# Patient Record
Sex: Male | Born: 1945 | Race: White | Hispanic: No | Marital: Married | Smoking: Never smoker
Health system: Southern US, Community
[De-identification: ages and names within clinical notes are randomized; demographics above are authoritative.]

## PROBLEM LIST (undated history)

## (undated) DIAGNOSIS — R112 Nausea with vomiting, unspecified: Secondary | ICD-10-CM

## (undated) DIAGNOSIS — IMO0001 Reserved for inherently not codable concepts without codable children: Secondary | ICD-10-CM

## (undated) DIAGNOSIS — G629 Polyneuropathy, unspecified: Secondary | ICD-10-CM

## (undated) DIAGNOSIS — F419 Anxiety disorder, unspecified: Secondary | ICD-10-CM

## (undated) DIAGNOSIS — M545 Low back pain, unspecified: Secondary | ICD-10-CM

## (undated) DIAGNOSIS — L409 Psoriasis, unspecified: Secondary | ICD-10-CM

## (undated) DIAGNOSIS — K219 Gastro-esophageal reflux disease without esophagitis: Secondary | ICD-10-CM

## (undated) DIAGNOSIS — Z87442 Personal history of urinary calculi: Secondary | ICD-10-CM

## (undated) DIAGNOSIS — Z9889 Other specified postprocedural states: Secondary | ICD-10-CM

## (undated) DIAGNOSIS — Z860100 Personal history of colon polyps, unspecified: Secondary | ICD-10-CM

## (undated) DIAGNOSIS — G4730 Sleep apnea, unspecified: Secondary | ICD-10-CM

## (undated) DIAGNOSIS — I10 Essential (primary) hypertension: Secondary | ICD-10-CM

## (undated) DIAGNOSIS — G4700 Insomnia, unspecified: Secondary | ICD-10-CM

## (undated) DIAGNOSIS — G2581 Restless legs syndrome: Secondary | ICD-10-CM

## (undated) DIAGNOSIS — F329 Major depressive disorder, single episode, unspecified: Secondary | ICD-10-CM

## (undated) DIAGNOSIS — F32A Depression, unspecified: Secondary | ICD-10-CM

## (undated) HISTORY — DX: Depression, unspecified: F32.A

## (undated) HISTORY — PX: RETINAL LASER PROCEDURE: SHX2339

## (undated) HISTORY — DX: Restless legs syndrome: G25.81

## (undated) HISTORY — PX: CYSTECTOMY: SUR359

## (undated) HISTORY — PX: LUMBAR EPIDURAL INJECTION: SHX1980

## (undated) HISTORY — DX: Low back pain: M54.5

## (undated) HISTORY — DX: Insomnia, unspecified: G47.00

## (undated) HISTORY — DX: Psoriasis, unspecified: L40.9

## (undated) HISTORY — PX: UVULOPALATOPHARYNGOPLASTY: SHX827

## (undated) HISTORY — DX: Personal history of colon polyps, unspecified: Z86.0100

## (undated) HISTORY — PX: SINUS SURGERY WITH INSTATRAK: SHX5215

## (undated) HISTORY — DX: Gastro-esophageal reflux disease without esophagitis: K21.9

## (undated) HISTORY — PX: OTHER SURGICAL HISTORY: SHX169

## (undated) HISTORY — DX: Essential (primary) hypertension: I10

## (undated) HISTORY — PX: TONSILLECTOMY: SUR1361

## (undated) HISTORY — PX: CATARACT EXTRACTION: SUR2

## (undated) HISTORY — DX: Personal history of colonic polyps: Z86.010

## (undated) HISTORY — DX: Sleep apnea, unspecified: G47.30

## (undated) HISTORY — DX: Low back pain, unspecified: M54.50

## (undated) HISTORY — DX: Polyneuropathy, unspecified: G62.9

## (undated) HISTORY — DX: Major depressive disorder, single episode, unspecified: F32.9

---

## 2002-02-16 HISTORY — PX: OTHER SURGICAL HISTORY: SHX169

## 2006-02-16 HISTORY — PX: LITHOTRIPSY: SUR834

## 2006-08-10 ENCOUNTER — Ambulatory Visit: Payer: Self-pay | Admitting: Family Medicine

## 2006-08-10 LAB — CONVERTED CEMR LAB
Albumin: 3.9 g/dL (ref 3.5–5.2)
Basophils Absolute: 0 10*3/uL (ref 0.0–0.1)
Bilirubin, Direct: 0.1 mg/dL (ref 0.0–0.3)
Cholesterol: 142 mg/dL (ref 0–200)
Creatinine, Ser: 1 mg/dL (ref 0.4–1.5)
Eosinophils Absolute: 0.1 10*3/uL (ref 0.0–0.6)
HCT: 45.1 % (ref 39.0–52.0)
Hemoglobin: 15.2 g/dL (ref 13.0–17.0)
MCHC: 33.7 g/dL (ref 30.0–36.0)
MCV: 95.6 fL (ref 78.0–100.0)
Monocytes Absolute: 0.4 10*3/uL (ref 0.2–0.7)
Neutrophils Relative %: 63.7 % (ref 43.0–77.0)
PSA: 2.37 ng/mL (ref 0.10–4.00)
Potassium: 3.9 meq/L (ref 3.5–5.1)
RDW: 12.1 % (ref 11.5–14.6)
Sodium: 143 meq/L (ref 135–145)
TSH: 0.81 microintl units/mL (ref 0.35–5.50)
Total Bilirubin: 0.8 mg/dL (ref 0.3–1.2)

## 2006-08-11 ENCOUNTER — Ambulatory Visit: Payer: Self-pay | Admitting: Family Medicine

## 2006-08-23 ENCOUNTER — Ambulatory Visit: Payer: Self-pay | Admitting: Family Medicine

## 2006-08-23 DIAGNOSIS — J309 Allergic rhinitis, unspecified: Secondary | ICD-10-CM | POA: Insufficient documentation

## 2006-09-21 ENCOUNTER — Ambulatory Visit (HOSPITAL_COMMUNITY): Admission: RE | Admit: 2006-09-21 | Discharge: 2006-09-21 | Payer: Self-pay | Admitting: Urology

## 2006-09-23 ENCOUNTER — Ambulatory Visit (HOSPITAL_COMMUNITY): Admission: RE | Admit: 2006-09-23 | Discharge: 2006-09-23 | Payer: Self-pay | Admitting: Urology

## 2006-09-28 ENCOUNTER — Encounter: Payer: Self-pay | Admitting: Family Medicine

## 2006-10-11 ENCOUNTER — Encounter: Payer: Self-pay | Admitting: Family Medicine

## 2006-10-14 ENCOUNTER — Telehealth: Payer: Self-pay | Admitting: Family Medicine

## 2006-11-17 ENCOUNTER — Encounter: Payer: Self-pay | Admitting: Family Medicine

## 2006-12-20 DIAGNOSIS — I10 Essential (primary) hypertension: Secondary | ICD-10-CM

## 2007-01-11 ENCOUNTER — Ambulatory Visit: Payer: Self-pay | Admitting: Family Medicine

## 2007-01-11 DIAGNOSIS — J939 Pneumothorax, unspecified: Secondary | ICD-10-CM | POA: Insufficient documentation

## 2007-01-11 DIAGNOSIS — Z87442 Personal history of urinary calculi: Secondary | ICD-10-CM

## 2007-01-11 DIAGNOSIS — F329 Major depressive disorder, single episode, unspecified: Secondary | ICD-10-CM

## 2007-01-11 DIAGNOSIS — Z86010 Personal history of colonic polyps: Secondary | ICD-10-CM

## 2007-01-11 DIAGNOSIS — S2239XA Fracture of one rib, unspecified side, initial encounter for closed fracture: Secondary | ICD-10-CM | POA: Insufficient documentation

## 2007-01-11 DIAGNOSIS — S42009A Fracture of unspecified part of unspecified clavicle, initial encounter for closed fracture: Secondary | ICD-10-CM | POA: Insufficient documentation

## 2007-01-11 DIAGNOSIS — G2581 Restless legs syndrome: Secondary | ICD-10-CM

## 2007-01-11 DIAGNOSIS — J018 Other acute sinusitis: Secondary | ICD-10-CM

## 2007-02-08 ENCOUNTER — Ambulatory Visit: Payer: Self-pay | Admitting: Family Medicine

## 2007-02-08 DIAGNOSIS — R079 Chest pain, unspecified: Secondary | ICD-10-CM

## 2007-02-08 DIAGNOSIS — G4700 Insomnia, unspecified: Secondary | ICD-10-CM

## 2007-03-01 ENCOUNTER — Encounter: Payer: Self-pay | Admitting: Family Medicine

## 2007-03-15 ENCOUNTER — Ambulatory Visit: Payer: Self-pay | Admitting: Family Medicine

## 2007-03-15 DIAGNOSIS — K219 Gastro-esophageal reflux disease without esophagitis: Secondary | ICD-10-CM

## 2007-03-15 DIAGNOSIS — R5383 Other fatigue: Secondary | ICD-10-CM

## 2007-03-15 DIAGNOSIS — R5381 Other malaise: Secondary | ICD-10-CM

## 2007-03-29 ENCOUNTER — Telehealth: Payer: Self-pay | Admitting: Family Medicine

## 2007-05-11 ENCOUNTER — Telehealth: Payer: Self-pay | Admitting: Family Medicine

## 2007-05-13 ENCOUNTER — Ambulatory Visit: Payer: Self-pay | Admitting: Family Medicine

## 2007-05-13 DIAGNOSIS — G589 Mononeuropathy, unspecified: Secondary | ICD-10-CM | POA: Insufficient documentation

## 2007-05-16 ENCOUNTER — Telehealth: Payer: Self-pay | Admitting: Family Medicine

## 2007-05-16 LAB — CONVERTED CEMR LAB
ALT: 21 units/L (ref 0–53)
AST: 20 units/L (ref 0–37)
Alkaline Phosphatase: 67 units/L (ref 39–117)
Basophils Absolute: 0 10*3/uL (ref 0.0–0.1)
Basophils Relative: 0.5 % (ref 0.0–1.0)
Bilirubin, Direct: 0.2 mg/dL (ref 0.0–0.3)
CO2: 32 meq/L (ref 19–32)
Chloride: 104 meq/L (ref 96–112)
GFR calc non Af Amer: 73 mL/min
Lymphocytes Relative: 30 % (ref 12.0–46.0)
MCHC: 33.7 g/dL (ref 30.0–36.0)
Neutrophils Relative %: 58.2 % (ref 43.0–77.0)
Platelets: 122 10*3/uL — ABNORMAL LOW (ref 150–400)
Potassium: 4.1 meq/L (ref 3.5–5.1)
RBC: 4.46 M/uL (ref 4.22–5.81)
RDW: 12.7 % (ref 11.5–14.6)
Sodium: 141 meq/L (ref 135–145)
Total Bilirubin: 0.9 mg/dL (ref 0.3–1.2)

## 2007-05-20 ENCOUNTER — Encounter: Payer: Self-pay | Admitting: Family Medicine

## 2007-06-15 ENCOUNTER — Telehealth: Payer: Self-pay | Admitting: Family Medicine

## 2007-06-16 ENCOUNTER — Ambulatory Visit: Payer: Self-pay | Admitting: Family Medicine

## 2007-09-16 ENCOUNTER — Ambulatory Visit: Payer: Self-pay | Admitting: Internal Medicine

## 2007-10-31 ENCOUNTER — Telehealth: Payer: Self-pay | Admitting: Family Medicine

## 2007-11-11 ENCOUNTER — Ambulatory Visit: Payer: Self-pay | Admitting: Family Medicine

## 2007-11-11 DIAGNOSIS — K589 Irritable bowel syndrome without diarrhea: Secondary | ICD-10-CM

## 2007-11-11 DIAGNOSIS — Z87898 Personal history of other specified conditions: Secondary | ICD-10-CM

## 2007-11-15 LAB — CONVERTED CEMR LAB
Alkaline Phosphatase: 65 units/L (ref 39–117)
Basophils Absolute: 0 10*3/uL (ref 0.0–0.1)
Bilirubin, Direct: 0.2 mg/dL (ref 0.0–0.3)
Calcium: 9.2 mg/dL (ref 8.4–10.5)
Cholesterol: 168 mg/dL (ref 0–200)
GFR calc Af Amer: 72 mL/min
Glucose, Bld: 94 mg/dL (ref 70–99)
HCT: 43.7 % (ref 39.0–52.0)
HDL: 49.2 mg/dL (ref >39.0)
LDL Cholesterol: 109 mg/dL — ABNORMAL HIGH (ref 0–99)
Lymphocytes Relative: 27.2 % (ref 12.0–46.0)
MCHC: 34.6 g/dL (ref 30.0–36.0)
Monocytes Absolute: 0.4 10*3/uL (ref 0.1–1.0)
Monocytes Relative: 8.6 % (ref 3.0–12.0)
Neutro Abs: 2.8 10*3/uL (ref 1.4–7.7)
Platelets: 109 10*3/uL — ABNORMAL LOW (ref 150–400)
Potassium: 3.9 meq/L (ref 3.5–5.1)
RDW: 12.7 % (ref 11.5–14.6)
Sodium: 146 meq/L — ABNORMAL HIGH (ref 135–145)
TSH: 0.4 microintl units/mL (ref 0.35–5.50)
Total Bilirubin: 1.1 mg/dL (ref 0.3–1.2)
Total CHOL/HDL Ratio: 3.4
Total Protein: 6.6 g/dL (ref 6.0–8.3)
Triglycerides: 48 mg/dL (ref 0–149)

## 2007-11-25 ENCOUNTER — Encounter: Payer: Self-pay | Admitting: Family Medicine

## 2008-01-16 ENCOUNTER — Ambulatory Visit: Payer: Self-pay | Admitting: Family Medicine

## 2008-01-25 ENCOUNTER — Ambulatory Visit: Payer: Self-pay | Admitting: Gastroenterology

## 2008-03-02 ENCOUNTER — Ambulatory Visit: Payer: Self-pay | Admitting: Family Medicine

## 2008-03-02 DIAGNOSIS — M79609 Pain in unspecified limb: Secondary | ICD-10-CM

## 2008-04-13 ENCOUNTER — Ambulatory Visit: Payer: Self-pay

## 2008-04-13 ENCOUNTER — Encounter: Payer: Self-pay | Admitting: Family Medicine

## 2008-05-10 ENCOUNTER — Telehealth: Payer: Self-pay | Admitting: Family Medicine

## 2008-05-11 ENCOUNTER — Telehealth: Payer: Self-pay | Admitting: Family Medicine

## 2008-06-13 ENCOUNTER — Ambulatory Visit: Payer: Self-pay | Admitting: Family Medicine

## 2008-06-13 DIAGNOSIS — N390 Urinary tract infection, site not specified: Secondary | ICD-10-CM

## 2008-06-14 ENCOUNTER — Encounter: Payer: Self-pay | Admitting: Family Medicine

## 2008-06-17 ENCOUNTER — Telehealth: Payer: Self-pay | Admitting: Family Medicine

## 2008-06-18 ENCOUNTER — Telehealth: Payer: Self-pay | Admitting: Family Medicine

## 2008-07-02 ENCOUNTER — Telehealth: Payer: Self-pay | Admitting: Family Medicine

## 2008-07-10 ENCOUNTER — Encounter: Payer: Self-pay | Admitting: Family Medicine

## 2008-11-13 ENCOUNTER — Telehealth: Payer: Self-pay | Admitting: Family Medicine

## 2008-11-15 ENCOUNTER — Telehealth: Payer: Self-pay | Admitting: Family Medicine

## 2008-12-14 ENCOUNTER — Ambulatory Visit: Payer: Self-pay | Admitting: Family Medicine

## 2008-12-14 LAB — CONVERTED CEMR LAB
AST: 19 units/L (ref 0–37)
BUN: 17 mg/dL (ref 6–23)
Basophils Absolute: 0 10*3/uL (ref 0.0–0.1)
Bilirubin, Direct: 0.1 mg/dL (ref 0.0–0.3)
Calcium: 8.9 mg/dL (ref 8.4–10.5)
Cholesterol: 147 mg/dL (ref 0–200)
Creatinine, Ser: 1.1 mg/dL (ref 0.4–1.5)
Eosinophils Relative: 4.1 % (ref 0.0–5.0)
GFR calc non Af Amer: 72.08 mL/min (ref >60)
Glucose, Bld: 108 mg/dL — ABNORMAL HIGH (ref 70–99)
HCT: 43 % (ref 39.0–52.0)
HDL: 51.2 mg/dL (ref >39.00)
LDL Cholesterol: 85 mg/dL (ref 0–99)
Lymphocytes Relative: 31.9 % (ref 12.0–46.0)
Lymphs Abs: 1.2 10*3/uL (ref 0.7–4.0)
Monocytes Relative: 9.5 % (ref 3.0–12.0)
Neutrophils Relative %: 54.1 % (ref 43.0–77.0)
PSA: 2.86 ng/mL (ref 0.10–4.00)
Platelets: 113 10*3/uL — ABNORMAL LOW (ref 150.0–400.0)
RDW: 12.8 % (ref 11.5–14.6)
TSH: 1.24 microintl units/mL (ref 0.35–5.50)
Total Bilirubin: 0.7 mg/dL (ref 0.3–1.2)
Triglycerides: 53 mg/dL (ref 0.0–149.0)
VLDL: 10.6 mg/dL (ref 0.0–40.0)
WBC: 3.8 10*3/uL — ABNORMAL LOW (ref 4.5–10.5)

## 2008-12-17 ENCOUNTER — Telehealth (INDEPENDENT_AMBULATORY_CARE_PROVIDER_SITE_OTHER): Payer: Self-pay | Admitting: *Deleted

## 2009-01-02 ENCOUNTER — Ambulatory Visit: Payer: Self-pay | Admitting: Family Medicine

## 2009-01-02 DIAGNOSIS — J209 Acute bronchitis, unspecified: Secondary | ICD-10-CM

## 2009-02-18 ENCOUNTER — Telehealth: Payer: Self-pay | Admitting: Family Medicine

## 2009-04-12 ENCOUNTER — Telehealth: Payer: Self-pay | Admitting: Family Medicine

## 2009-04-30 ENCOUNTER — Telehealth: Payer: Self-pay | Admitting: Family Medicine

## 2010-01-31 ENCOUNTER — Ambulatory Visit: Payer: Self-pay | Admitting: Family Medicine

## 2010-01-31 DIAGNOSIS — M545 Low back pain: Secondary | ICD-10-CM

## 2010-01-31 DIAGNOSIS — F418 Other specified anxiety disorders: Secondary | ICD-10-CM | POA: Insufficient documentation

## 2010-01-31 DIAGNOSIS — M1990 Unspecified osteoarthritis, unspecified site: Secondary | ICD-10-CM | POA: Insufficient documentation

## 2010-02-03 LAB — CONVERTED CEMR LAB
BUN: 18 mg/dL (ref 6–23)
Basophils Absolute: 0 10*3/uL (ref 0.0–0.1)
Bilirubin, Direct: 0.2 mg/dL (ref 0.0–0.3)
CO2: 31 meq/L (ref 19–32)
Calcium: 8.9 mg/dL (ref 8.4–10.5)
Chloride: 103 meq/L (ref 96–112)
Cholesterol: 161 mg/dL (ref 0–200)
Creatinine, Ser: 1.2 mg/dL (ref 0.4–1.5)
Eosinophils Absolute: 0.1 10*3/uL (ref 0.0–0.7)
HDL: 47.7 mg/dL (ref >39.00)
LDL Cholesterol: 99 mg/dL (ref 0–99)
Lymphocytes Relative: 31.2 % (ref 12.0–46.0)
MCHC: 34.6 g/dL (ref 30.0–36.0)
MCV: 95.4 fL (ref 78.0–100.0)
Monocytes Absolute: 0.3 10*3/uL (ref 0.1–1.0)
Neutrophils Relative %: 57.3 % (ref 43.0–77.0)
Platelets: 123 10*3/uL — ABNORMAL LOW (ref 150.0–400.0)
Total Bilirubin: 1 mg/dL (ref 0.3–1.2)
Triglycerides: 73 mg/dL (ref 0.0–149.0)
Vitamin B-12: 411 pg/mL (ref 211–911)

## 2010-02-13 ENCOUNTER — Telehealth: Payer: Self-pay | Admitting: Family Medicine

## 2010-02-19 ENCOUNTER — Telehealth: Payer: Self-pay | Admitting: Family Medicine

## 2010-03-19 NOTE — Progress Notes (Signed)
Summary: refill ambien  Phone Note From Pharmacy   Caller: [id number] Call For: Brandon Schroeder  Summary of Call: refill zolpidem 10mg  1 by mouth at bedtime Initial call taken by: Alfred Levins, CMA,  February 18, 2009 2:22 PM  Follow-up for Phone Call        we gave him a 6 month supply on 12-14-08. he still has one rf available Follow-up by: Nelwyn Salisbury MD,  February 19, 2009 8:15 AM  Additional Follow-up for Phone Call Additional follow up Details #1::        Phone call completed, Pharmacist called Additional Follow-up by: Alfred Levins, CMA,  February 19, 2009 9:18 AM
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
## 2010-03-19 NOTE — Progress Notes (Signed)
Summary: Pt req zpak or antibiotic for bacterial inf  Phone Note Call from Patient Call back at [phone] cell   Caller: Patient Summary of Call: Pt called in and is req a zpak or another antibiotic called bacterial inf. Please call in to [facility]  Initial call taken by: Lucy Antigua,  April 12, 2009 1:28 PM  Follow-up for Phone Call        call in a  Zpack Follow-up by: Nelwyn Salisbury MD,  April 12, 2009 3:44 PM  Additional Follow-up for Phone Call Additional follow up Details #1::        Phone Call Completed, Rx Called In Additional Follow-up by: Alfred Levins, CMA,  April 12, 2009 3:51 PM    New/Updated Medications: ZITHROMAX Z-PAK 250 MG  TABS (AZITHROMYCIN) Use as directed Prescriptions: ZITHROMAX Z-PAK 250 MG  TABS (AZITHROMYCIN) Use as directed  #1 x 0   Entered by:   Alfred Levins, CMA   Authorized by:   Nelwyn Salisbury MD   Signed by:   Alfred Levins, CMA on 04/12/2009   Method used:   Electronically to        [id number] (retail)       [address]       [place], [place]  [address]       Ph: [phone] or [phone]       Fax: [phone]   RxID:   [id number]

## 2010-03-20 NOTE — Progress Notes (Signed)
Summary: does not want vicodin   Phone Note Call from Patient Call back at Home Phone [phone]   Caller: Patient Call For: Brandon Salisbury MD Summary of Call: Etodolac is not helping pt's pain and would like to change it. Initial call taken by: [name] CMA AAMA,  February 13, 2010 1:14 PM  Follow-up for Phone Call        call in Vicodin 5/500 q 6 hours as needed pain, #60 with no rf  Follow-up by: Brandon Salisbury MD,  February 14, 2010 4:30 PM  Additional Follow-up for Phone Call Additional follow up Details #1::        pt notifed and said he does not want vicodin  [facility]  etotodalac "irritated his stomach"  Additional Follow-up by: Pura Spice, RN,  February 14, 2010 4:43 PM    Additional Follow-up for Phone Call Additional follow up Details #2::    try Meloxicam 15 mg once daily , call in #30 with 5 rf  Follow-up by: Brandon Salisbury MD,  February 19, 2010 8:41 AM  Additional Follow-up for Phone Call Additional follow up Details #3:: Details for Additional Follow-up Action Taken: done pt aware.   Additional Follow-up by: Pura Spice, RN,  February 19, 2010 8:41 AM  New/Updated Medications: MELOXICAM 15 MG TABS (MELOXICAM) 1 by mouth once daily Prescriptions: MELOXICAM 15 MG TABS (MELOXICAM) 1 by mouth once daily  #30 x 5   Entered by:   Pura Spice, RN   Authorized by:   Brandon Salisbury MD   Signed by:   Pura Spice, RN on 02/19/2010   Method used:   Electronically to        [id number] (retail)       [address]       [place], [place]  [address]       Ph: [phone] or [phone]       Fax: [phone]   RxID:   [id number]

## 2010-03-20 NOTE — Progress Notes (Signed)
Summary: increase zoloft   Phone Note Call from Patient   Caller: Patient Summary of Call: wants to increase his zoloft  [facility] [address]  Initial call taken by: Pura Spice, RN,  February 19, 2010 8:45 AM  Follow-up for Phone Call        increase to 100 mg a day, he may take 2 50 mg tabs a day. Let me know how he is doing in 2 weeks  Follow-up by: Nelwyn Salisbury MD,  February 21, 2010 8:45 AM  Additional Follow-up for Phone Call Additional follow up Details #1::        pt aware.  Additional Follow-up by: Pura Spice, RN,  February 21, 2010 3:30 PM
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
## 2010-05-18 HISTORY — PX: CARPAL TUNNEL RELEASE: SHX101

## 2010-05-30 ENCOUNTER — Telehealth: Payer: Self-pay | Admitting: *Deleted

## 2010-05-30 NOTE — Telephone Encounter (Signed)
Pt states that he had carpal tunnel surgery one week ago, and has been having chills and abdominal cramping since.  His surgeon is aware, but he will call him and let him know that it is still going on, and call us back if needed.

## 2010-06-02 NOTE — Telephone Encounter (Signed)
Notified Dr. Kirtland Bouchard.

## 2010-06-04 ENCOUNTER — Ambulatory Visit (INDEPENDENT_AMBULATORY_CARE_PROVIDER_SITE_OTHER): Payer: BC Managed Care – PPO | Admitting: Family Medicine

## 2010-06-04 ENCOUNTER — Encounter: Payer: Self-pay | Admitting: Family Medicine

## 2010-06-04 VITALS — BP 118/78 | HR 89 | Temp 98.5°F | Wt 223.0 lb

## 2010-06-04 DIAGNOSIS — I10 Essential (primary) hypertension: Secondary | ICD-10-CM

## 2010-06-04 DIAGNOSIS — J329 Chronic sinusitis, unspecified: Secondary | ICD-10-CM

## 2010-06-04 MED ORDER — DOXYCYCLINE HYCLATE 100 MG PO CAPS: 100.0000 mg | ORAL_CAPSULE | Freq: Two times a day (BID) | ORAL | Status: AC

## 2010-06-04 MED ORDER — AMLODIPINE BESY-BENAZEPRIL HCL 5-40 MG PO CAPS: 1.0000 | ORAL_CAPSULE | Freq: Every day | ORAL | Status: DC

## 2010-06-04 MED ORDER — FLUTICASONE PROPIONATE 50 MCG/ACT NA SUSP: 2.0000 | Freq: Every day | NASAL | Status: DC

## 2010-06-04 MED ORDER — ZOLPIDEM TARTRATE 10 MG PO TABS: 10.0000 mg | ORAL_TABLET | Freq: Every evening | ORAL | Status: DC | PRN

## 2010-06-04 NOTE — Progress Notes (Signed)
  Subjective:    Patient ID: Brandon Schroeder, male    DOB: 1945/03/14, 66 y.o.   MRN: [id number]  HPI Here for one week of sinus pressure, PND, ST,and a dry cough. No fever.    Review of Systems  Constitutional: Negative.   HENT: Positive for congestion, postnasal drip and sinus pressure.   Eyes: Negative.   Respiratory: Positive for cough.        Objective:   Physical Exam  Constitutional: He appears well-developed and well-nourished.  HENT:  Head: Normocephalic.  Right Ear: External ear normal.  Left Ear: External ear normal.  Nose: Nose normal.  Mouth/Throat: Oropharynx is clear and moist. No oropharyngeal exudate.  Eyes: Conjunctivae are normal. Pupils are equal, round, and reactive to light.  Cardiovascular: Normal rate, regular rhythm, normal heart sounds and intact distal pulses.   Pulmonary/Chest: Effort normal and breath sounds normal. No respiratory distress. He has no wheezes. He has no rales. He exhibits no tenderness.  Lymphadenopathy:    He has no cervical adenopathy.          Assessment & Plan:  Rest, fluids, Mucinex
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
## 2010-07-01 NOTE — Assessment & Plan Note (Signed)
[facility] OFFICE NOTE   Brandon Schroeder, Brandon Schroeder                          MRN:          [id number]  DATE:08/23/2006                            DOB:          1945-06-12    This is a 65 year old gentleman here for a complete physical  examination. In general he is doing well and has no particular  complaints. I had an introductory visit with him on June 24th. I refer  you to this note concerning details of his past medical history, family  history, social history, habits, etc. One thing we have been adjusting  lately is his blood pressure. We increased his Lotrel a few weeks ago  and it seems to have worked nicely. He is tolerating it well. We also  added aspirin to his regimen. When I saw him on June 24, we started him  on a course of Levaquin for a sinus infection, after 1 pill he began  showing an ALLERGIC REACTION. He took Benadryl and this probably  resolved. We then treated him a 10 day course of Biaxin and this  resolved all of his symptoms. 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 He is due to see Dr. [place] Service to evaluate this on  July 24. He is also due to have Dr. Terri Piedra give him a full skin check on  July 22. One thing we can add to his past medical history, a year ago he  had some vascular health screenings done and on Jul 10, 2005 he had  Doppler done to both carotid arteries. 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   ALLERGIES:  PENICILLIN AND LEVAQUIN.   CURRENT MEDICATIONS:  1. Lotrel 10/20 once a day.  2. Zyrtec 10 mg per day.  3. Clonazepam 0.5 mg at bedtime.  4. Multivitamins daily.  5. Aspirin 81 mg daily.  6. Nasacort nasal sprays daily.  7. Ambien 10 mg 2 tablets at bedtime time as needed for sleep.   OBJECTIVE:  Height 6 feet 2 inches, weight 221, blood pressure 128/82,  pulse 92 and regular.  IN  GENERAL: He appears healthy.  SKIN: Shows some solar damage around the shoulders and arms.  EYES: Clear.  EARS: Clear.  PHARYNX: Clear.  NECK: Supple without lymphadenopathy or masses.  LUNGS: Clear.  CARDIAC: Rate and rhythm regular without gallops, murmurs, or rubs.  Distal pulses are full.   EKG: Within normal limits.   ABDOMEN: Soft, normal bowel sounds, nontender. No masses.  We deferred a genitalia exam because he will be seeing Dr. Wanda Plump in  a few weeks.  We did do a rectal exam today. Prostate is within normal limits. Stool  is Hemoccult negative.  EXTREMITIES: No clubbing, cyanosis, or edema.  NEUROLOGIC EXAM: Grossly intact.   He was here for fasting labs on June 24. These were all basically within  normal limits with the exception of a low HDL at 34 with an adequate LDL  of 92. He also  did show some blood in his urine as usual. PSA was within  normal limits at 2.37.   ASSESSMENT/PLAN:  1. Complete physical exam. I talked about getting more exercise on a      regular basis.  2. Hypertension, stable.  3. Skin changes, he will follow up with dermatology as above.  4. Kidney stone and hematuria, he will follow up with urology as      above.  5. Insomnia, stable.  6. Restless legs, stable.     Tera Mater. Clent Ridges, MD  Electronically Signed    SAF/MedQ  DD: 08/23/2006  DT: 08/23/2006  Job #: [id number]

## 2010-07-01 NOTE — Op Note (Signed)
NAME:  Brandon Schroeder, HEARD NO.:  [id number]   MEDICAL RECORD NO.:  [id number]          PATIENT TYPE:  AMB   LOCATION:  DAY                          FACILITY:  [facility]   PHYSICIAN:  Boston Service, M.D.DATE OF BIRTH:  1945/09/21   DATE OF PROCEDURE:  09/21/2006  DATE OF DISCHARGE:                               OPERATIVE REPORT   PREOPERATIVE DIAGNOSIS:  [age]-year-old male recently relocated  from [place] to [place], previous extracorporeal shock wave  lithotripsy with Dr. Albina Billet in about 2005. The patient relates  that post extracorporeal shock wave lithotripsy, KUBs showed 2 or 3  very small fragments in the lower pole of the right kidney.  Recent  radiographic studies showed what appear to be 15-mm and 7-mm stones  within the right renal pelvis; these apparently match up well with  radiographs done in [place] in about April 2008.  I discussed the  situation with the patient; he seems very familiar with a technique of  lithotripsy.  Mainly due to the size of the stones, I do think he would  benefit from a double-J stent to avoid ureteral obstruction as he passes  the fragments.   POSTOPERATIVE DIAGNOSIS:  [age]-year-old male recently relocated  from [place] to [place], previous extracorporeal shock wave  lithotripsy with Dr. Albina Billet in about 2005. The patient relates  that post extracorporeal shock wave lithotripsy, KUBs showed 2 or 3  very small fragments in the lower pole of the right kidney.  Recent  radiographic studies showed what appear to be 15-mm and 7-mm stones  within the right renal pelvis; these apparently match up well with  radiographs done in [place] in about April 2008.  I discussed the  situation with the patient; he seems very familiar with a technique of  lithotripsy.  Mainly due to the size of the stones, I do think he would  benefit from a double-J stent to avoid ureteral obstruction as he passes  the  fragments.   PROCEDURE:  1. Cystoscopy.  2. Retrograde double-J stent.   SURGEON:  Boston Service, M.D.   ASSISTANTS:  None.   FINDINGS:  Fifteen- and eight-millimeter calculi.   DRAINS:  A 6-French 26-cm double-J stent.   COMPLICATIONS:  None obvious.   DESCRIPTION OF PROCEDURE:  The patient was prepped and draped in the  dorsal lithotomy position after institution of adequate level of general  anesthesia.  A well-lubricated 21-French panendoscope was gently  inserted at the urethral meatus, normal urethra and sphincter,  nonobstructive prostate.  Bladder was inspected with the 12- and 70-  degree lenses and showed no obvious evidence of intravesical pathology.   Ureteral catheter was selected, positioned at the right ureteral  orifice.  With gentle injection of contrast the ureter, pelvis and  calyces were outlined.  There appeared to be mild physiologic narrowing  in the region of the right UPJ with a 15-mm filling defect within the  pelvis and a 7- to 8-mm filling defect in the lower pole calyces.  A  floppy-tip guidewire was then gently inserted, appeared to pass into  dilated  upper pole calyces, ureteral catheter passed easily over the  guidewire.  Ureteral catheter was then withdrawn.  A 6-French 26-cm  double-J stent was then selected with guidewire removal.  There appeared  to be excellent pigtail formation, both in the renal pelvis and in the  bladder.  Ureteral catheter was then positioned at the left ureteral  orifice.  Left ureter showed normal course and caliber.  Again, there  was mild physiologic narrowing at the UPJ, but no evidence of  obstruction, no filling defects, with prompt drainage at 3-6 minutes.  Bladder was then drained.  Cystoscope was removed.  The patient was  given a B&O suppository and returned to [facility] in satisfactory  condition.           ______________________________  Boston Service, M.D.     RH/MEDQ  D:  09/21/2006  T:   09/21/2006  Job:  [id number]   cc:   Jeannett Senior A. Clent Ridges, MD  [address] [place]  [place] [address]   [place], [place] Albina Billet MD

## 2010-08-18 ENCOUNTER — Other Ambulatory Visit: Payer: Self-pay | Admitting: *Deleted

## 2010-08-18 NOTE — Telephone Encounter (Signed)
Refill ambien

## 2010-08-19 ENCOUNTER — Encounter: Payer: Self-pay | Admitting: Family Medicine

## 2010-08-19 ENCOUNTER — Encounter: Payer: Self-pay | Admitting: *Deleted

## 2010-08-19 MED ORDER — AMLODIPINE BESY-BENAZEPRIL HCL 5-40 MG PO CAPS: 1.0000 | ORAL_CAPSULE | Freq: Every day | ORAL | Status: DC

## 2010-08-19 MED ORDER — ZOLPIDEM TARTRATE 10 MG PO TABS: 10.0000 mg | ORAL_TABLET | Freq: Every evening | ORAL | Status: DC | PRN

## 2010-08-19 MED ORDER — FLUTICASONE PROPIONATE 50 MCG/ACT NA SUSP: 2.0000 | Freq: Every day | NASAL | Status: DC

## 2010-08-19 NOTE — Telephone Encounter (Signed)
Refill left on pharmacy voicemail. Notified pt, he states he is now using [facility] mail order service and needs refill sent to them. He also requests 90 day supply on Lotrel and Flonase to [facility]. Refills called to Christine at [facility]. Zolpidem refill cancelled at [facility].

## 2010-08-19 NOTE — Telephone Encounter (Signed)
Call in #[phone] with 5 rf 

## 2010-08-28 HISTORY — PX: EYE SURGERY: SHX253

## 2010-10-09 ENCOUNTER — Telehealth: Payer: Self-pay | Admitting: Family Medicine

## 2010-10-09 NOTE — Telephone Encounter (Signed)
Call in #[phone] with 5 rf 

## 2010-10-09 NOTE — Telephone Encounter (Signed)
Refill request for Zolpidem Tartrate 10 mg take 1 po qhs. Pt last here on 06/04/10 and script last filled on 05/30/10.

## 2010-10-10 NOTE — Telephone Encounter (Signed)
Pt requesting a 90 day supply and Dr. Clent Ridges approved.

## 2010-10-13 MED ORDER — ZOLPIDEM TARTRATE 10 MG PO TABS: 10.0000 mg | ORAL_TABLET | Freq: Every evening | ORAL | Status: DC | PRN

## 2010-10-13 NOTE — Telephone Encounter (Signed)
I called in a 90 day supply to [facility] and left message for pt.

## 2010-10-13 NOTE — Telephone Encounter (Signed)
I left message for pt. I need to know if pt wants this called in to local pharmacy or the mail order?

## 2010-12-01 LAB — HEMOGLOBIN AND HEMATOCRIT, BLOOD
HCT: 40.4
Hemoglobin: 14.3

## 2010-12-01 LAB — BASIC METABOLIC PANEL
Chloride: 107
Creatinine, Ser: 1.01
GFR calc Af Amer: >60
Potassium: 3.7
Sodium: 141

## 2011-02-06 ENCOUNTER — Encounter: Payer: Self-pay | Admitting: Family Medicine

## 2011-02-06 ENCOUNTER — Ambulatory Visit (INDEPENDENT_AMBULATORY_CARE_PROVIDER_SITE_OTHER): Payer: BC Managed Care – PPO | Admitting: Family Medicine

## 2011-02-06 VITALS — BP 114/82 | HR 78 | Temp 98.4°F | Wt 222.0 lb

## 2011-02-06 DIAGNOSIS — G629 Polyneuropathy, unspecified: Secondary | ICD-10-CM

## 2011-02-06 DIAGNOSIS — J40 Bronchitis, not specified as acute or chronic: Secondary | ICD-10-CM

## 2011-02-06 DIAGNOSIS — G4700 Insomnia, unspecified: Secondary | ICD-10-CM

## 2011-02-06 DIAGNOSIS — G589 Mononeuropathy, unspecified: Secondary | ICD-10-CM

## 2011-02-06 DIAGNOSIS — F329 Major depressive disorder, single episode, unspecified: Secondary | ICD-10-CM

## 2011-02-06 MED ORDER — GABAPENTIN 300 MG PO CAPS: 300.0000 mg | ORAL_CAPSULE | Freq: Two times a day (BID) | ORAL | Status: DC

## 2011-02-06 MED ORDER — TEMAZEPAM 30 MG PO CAPS: 30.0000 mg | ORAL_CAPSULE | Freq: Every evening | ORAL | Status: DC | PRN

## 2011-02-06 MED ORDER — SERTRALINE HCL 50 MG PO TABS: 50.0000 mg | ORAL_TABLET | Freq: Every day | ORAL | Status: DC

## 2011-02-06 MED ORDER — AZITHROMYCIN 250 MG PO TABS: ORAL_TABLET | ORAL | Status: AC

## 2011-02-06 NOTE — Progress Notes (Signed)
  Subjective:    Patient ID: Brandon Schroeder, male    DOB: 07-14-1945, 65 y.o.   MRN: [id number]  HPI Here for several issues. First he has had one week of chest congestion and coughing up green sputum. No fever. On Mucinex. Second, his depression is coming back and he wants to get back on Zoloft. Third, he has one year of burning and tingling in the feet consistent with neuropathy. We have evaluated this before and he chose to not treat it, but now he wants to treat this. Fourth he does not like Ambien for sleep because it causes memory problems for him.    Review of Systems  Constitutional: Negative.   HENT: Positive for congestion, postnasal drip and sinus pressure.   Eyes: Negative.   Respiratory: Positive for cough.   Neurological: Negative.   Psychiatric/Behavioral: Positive for sleep disturbance, dysphoric mood and decreased concentration. The patient is nervous/anxious.        Objective:   Physical Exam  Constitutional: He appears well-developed and well-nourished.  HENT:  Right Ear: External ear normal.  Left Ear: External ear normal.  Nose: Nose normal.  Mouth/Throat: Oropharynx is clear and moist. No oropharyngeal exudate.  Eyes: Conjunctivae are normal.  Pulmonary/Chest: Effort normal and breath sounds normal.  Lymphadenopathy:    He has no cervical adenopathy.  Psychiatric: He has a normal mood and affect. His behavior is normal. Thought content normal.          Assessment & Plan:  Use  Zpack for bronchitis. Try Gabapentin for the neuropathy. Switch from Ambien to Temazepam. Get back on Zoloftu

## 2011-03-18 ENCOUNTER — Other Ambulatory Visit (INDEPENDENT_AMBULATORY_CARE_PROVIDER_SITE_OTHER): Payer: BC Managed Care – PPO

## 2011-03-18 DIAGNOSIS — Z0000 Encounter for general adult medical examination without abnormal findings: Secondary | ICD-10-CM

## 2011-03-18 LAB — HEPATIC FUNCTION PANEL
ALT: 17 U/L (ref 0–53)
Alkaline Phosphatase: 79 U/L (ref 39–117)
Bilirubin, Direct: 0.1 mg/dL (ref 0.0–0.3)
Total Protein: 6.2 g/dL (ref 6.0–8.3)

## 2011-03-18 LAB — BASIC METABOLIC PANEL
Chloride: 106 mEq/L (ref 96–112)
Creatinine, Ser: 1.3 mg/dL (ref 0.4–1.5)
Potassium: 4.2 mEq/L (ref 3.5–5.1)
Sodium: 144 mEq/L (ref 135–145)

## 2011-03-18 LAB — POCT URINALYSIS DIPSTICK
Bilirubin, UA: NEGATIVE
Glucose, UA: NEGATIVE
Nitrite, UA: NEGATIVE

## 2011-03-18 LAB — LIPID PANEL: Total CHOL/HDL Ratio: 4

## 2011-03-18 LAB — CBC WITH DIFFERENTIAL/PLATELET
Basophils Relative: 0.4 % (ref 0.0–3.0)
Eosinophils Relative: 1.9 % (ref 0.0–5.0)
MCV: 95.9 fl (ref 78.0–100.0)
Monocytes Absolute: 0.4 10*3/uL (ref 0.1–1.0)
Neutrophils Relative %: 58.1 % (ref 43.0–77.0)
RBC: 4.41 Mil/uL (ref 4.22–5.81)
WBC: 4.7 10*3/uL (ref 4.5–10.5)

## 2011-03-18 LAB — TSH: TSH: 1.51 u[IU]/mL (ref 0.35–5.50)

## 2011-03-20 NOTE — Progress Notes (Signed)
Quick Note:  Left voice message ______ 

## 2011-03-25 ENCOUNTER — Ambulatory Visit (INDEPENDENT_AMBULATORY_CARE_PROVIDER_SITE_OTHER): Payer: BC Managed Care – PPO | Admitting: Family Medicine

## 2011-03-25 ENCOUNTER — Encounter: Payer: Self-pay | Admitting: Family Medicine

## 2011-03-25 VITALS — BP 110/72 | HR 84 | Temp 98.7°F | Ht 74.0 in | Wt 220.0 lb

## 2011-03-25 DIAGNOSIS — R350 Frequency of micturition: Secondary | ICD-10-CM

## 2011-03-25 DIAGNOSIS — IMO0001 Reserved for inherently not codable concepts without codable children: Secondary | ICD-10-CM

## 2011-03-25 DIAGNOSIS — Z0000 Encounter for general adult medical examination without abnormal findings: Secondary | ICD-10-CM

## 2011-03-25 LAB — POCT URINALYSIS DIPSTICK
Bilirubin, UA: NEGATIVE
Glucose, UA: NEGATIVE
Nitrite, UA: NEGATIVE
Urobilinogen, UA: 0.2

## 2011-03-25 MED ORDER — SERTRALINE HCL 100 MG PO TABS: 100.0000 mg | ORAL_TABLET | Freq: Every day | ORAL | Status: DC

## 2011-03-25 MED ORDER — SULFAMETHOXAZOLE-TRIMETHOPRIM 800-160 MG PO TABS: 1.0000 | ORAL_TABLET | Freq: Two times a day (BID) | ORAL | Status: AC

## 2011-03-25 MED ORDER — TEMAZEPAM 30 MG PO CAPS: 30.0000 mg | ORAL_CAPSULE | Freq: Every evening | ORAL | Status: DC | PRN

## 2011-03-25 MED ORDER — AMLODIPINE BESY-BENAZEPRIL HCL 5-40 MG PO CAPS: 1.0000 | ORAL_CAPSULE | Freq: Every day | ORAL | Status: DC

## 2011-03-25 MED ORDER — GABAPENTIN 300 MG PO CAPS: 300.0000 mg | ORAL_CAPSULE | Freq: Three times a day (TID) | ORAL | Status: DC

## 2011-03-25 MED ORDER — MELOXICAM 15 MG PO TABS: 15.0000 mg | ORAL_TABLET | Freq: Every day | ORAL | Status: DC

## 2011-03-25 NOTE — Progress Notes (Signed)
  Subjective:    Patient ID: Brandon Schroeder, male    DOB: Jul 18, 1945, 66 y.o.   MRN: [id number]  HPI 66 yr old male for a cpx. He feels well except for a few issues. When we saw him last December he mentioned some depression. He was started on Zoloft 50 mg a day, and this has helped. He would like to increase the dose a bit. Also he started on Gabapentin for neuropathy, and this has helped a lot. He would like to try more of this.    Review of Systems  Constitutional: Negative.   HENT: Negative.   Eyes: Negative.   Respiratory: Negative.   Cardiovascular: Negative.   Gastrointestinal: Negative.   Genitourinary: Negative.   Musculoskeletal: Negative.   Skin: Negative.   Neurological: Negative.   Hematological: Negative.   Psychiatric/Behavioral: Negative.        Objective:   Physical Exam  Constitutional: He is oriented to person, place, and time. He appears well-developed and well-nourished. No distress.  HENT:  Head: Normocephalic and atraumatic.  Right Ear: External ear normal.  Left Ear: External ear normal.  Nose: Nose normal.  Mouth/Throat: Oropharynx is clear and moist. No oropharyngeal exudate.  Eyes: Conjunctivae and EOM are normal. Pupils are equal, round, and reactive to light. Right eye exhibits no discharge. Left eye exhibits no discharge. No scleral icterus.  Neck: Neck supple. No JVD present. No tracheal deviation present. No thyromegaly present.  Cardiovascular: Normal rate, regular rhythm, normal heart sounds and intact distal pulses.  Exam reveals no gallop and no friction rub.   No murmur heard.      EKG normal   Pulmonary/Chest: Effort normal and breath sounds normal. No respiratory distress. He has no wheezes. He has no rales. He exhibits no tenderness.  Abdominal: Soft. Bowel sounds are normal. He exhibits no distension and no mass. There is no tenderness. There is no rebound and no guarding.  Genitourinary: Rectum normal, prostate normal and penis normal.  Guaiac negative stool. No penile tenderness.  Musculoskeletal: Normal range of motion. He exhibits no edema and no tenderness.  Lymphadenopathy:    He has no cervical adenopathy.  Neurological: He is alert and oriented to person, place, and time. He has normal reflexes. No cranial nerve deficit. He exhibits normal muscle tone. Coordination normal.  Skin: Skin is warm and dry. No rash noted. He is not diaphoretic. No erythema. No pallor.  Psychiatric: He has a normal mood and affect. His behavior is normal. Judgment and thought content normal.          Assessment & Plan:  Increase Zoloft to 100 mg a day. Increase Gabapentin to 300 mg tid. Treat the UTI.

## 2011-03-25 NOTE — Progress Notes (Signed)
Addended by: Aniceto Boss A on: 03/25/2011 02:59 PM   Modules accepted: Orders

## 2011-04-08 ENCOUNTER — Ambulatory Visit (AMBULATORY_SURGERY_CENTER): Payer: BC Managed Care – PPO | Admitting: *Deleted

## 2011-04-08 ENCOUNTER — Encounter: Payer: Self-pay | Admitting: Internal Medicine

## 2011-04-08 VITALS — Ht 74.0 in | Wt 224.1 lb

## 2011-04-08 DIAGNOSIS — Z1211 Encounter for screening for malignant neoplasm of colon: Secondary | ICD-10-CM

## 2011-04-08 MED ORDER — PEG-KCL-NACL-NASULF-NA ASC-C 100 G PO SOLR: ORAL | Status: DC

## 2011-04-10 ENCOUNTER — Telehealth: Payer: Self-pay | Admitting: *Deleted

## 2011-04-10 NOTE — Telephone Encounter (Signed)
PREVISIT DONE BY KATHY SMITH,RN. RELEASE OF INFORMATION FORM FILLED OUT AND SIGNED BY PATIENT AND GIVEN TO STACY HOWALD,CMA. PT SCHEDULED FOR COLONOSCOPY WITH DR.PYRTLE ON 04-16-11. LAST COLONOSCOPY 5 YEARS AGO IN [place]. PT SAYS HE HAD POLYPS BUT DOES NOT KNOW WHAT KIND. PLEASE LET PATIENT KNOW IF FAXED INFORMATION SHOWS HE DOES NOT NEED PROCEDURE PER DR.PYRTLE. THANKS.

## 2011-04-16 ENCOUNTER — Encounter: Payer: Self-pay | Admitting: Internal Medicine

## 2011-04-16 ENCOUNTER — Ambulatory Visit (AMBULATORY_SURGERY_CENTER): Payer: BC Managed Care – PPO | Admitting: Internal Medicine

## 2011-04-16 DIAGNOSIS — K635 Polyp of colon: Secondary | ICD-10-CM

## 2011-04-16 DIAGNOSIS — D126 Benign neoplasm of colon, unspecified: Secondary | ICD-10-CM

## 2011-04-16 DIAGNOSIS — Z1211 Encounter for screening for malignant neoplasm of colon: Secondary | ICD-10-CM

## 2011-04-16 HISTORY — PX: COLONOSCOPY: SHX174

## 2011-04-16 MED ORDER — SODIUM CHLORIDE 0.9 % IV SOLN: 500.0000 mL | INTRAVENOUS | Status: DC

## 2011-04-16 NOTE — Progress Notes (Signed)
Patient did not experience any of the following events: a burn prior to discharge; a fall within the facility; wrong site/side/patient/procedure/implant event; or a hospital transfer or hospital admission upon discharge from the facility. (G8907) Patient did not have preoperative order for IV antibiotic SSI prophylaxis. (G8918)  

## 2011-04-16 NOTE — Op Note (Signed)
[facility] [address] [place], [place]  [address]  COLONOSCOPY PROCEDURE REPORT  PATIENT:  Brandon, Schroeder  MR#:  [id number] BIRTHDATE:  September 14, 1945, 65 yrs. old  GENDER:  male ENDOSCOPIST:  Carie Caddy. Wynonia Medero, MD REF. BY:  Tera Mater. Clent Ridges, M.D. PROCEDURE DATE:  04/16/2011 PROCEDURE:  Colonoscopy with snare polypectomy, Colon with cold biopsy polypectomy ASA CLASS:  Class II INDICATIONS:  surveillance and high-risk screening (history of adenomatous colon polyps) MEDICATIONS:   MAC sedation, administered by CRNA  DESCRIPTION OF PROCEDURE:   After the risks benefits and alternatives of the procedure were thoroughly explained, informed consent was obtained.  Digital rectal exam was performed and revealed no rectal masses.   The LB CF-H180AL [id number] endoscope was introduced through the anus and advanced to the cecum, which was identified by both the appendix and ileocecal valve, without limitations.  The quality of the prep was good, using MoviPrep. The instrument was then slowly withdrawn as the colon was fully examined. <<PROCEDUREIMAGES>>  FINDINGS:  A 7 mm  sessile polyp was found in the ascending colon. Polyp was snared without cautery and removed with cold biopsy polypectomy. Retrieval was successful.  A 3 mm sessile polyp was found in the proximal transverse colon. Polyp was snared without cautery. Retrieval was successful. Moderate diverticulosis was found ascending colon to sigmoid colon.  Internal Hemorrhoids were found.   Retroflexed views in the rectum revealed no other findings other than those already described. The scope was then withdrawn from the cecum and the procedure completed.  COMPLICATIONS:  None ENDOSCOPIC IMPRESSION: 1) Sessile polyp in the ascending colon. Removed and sent to pathology. 2) Sessile polyp in the proximal transverse colon. Removed and sent to pathology. 3) Moderate diverticulosis ascending colon to sigmoid colon 4) Internal  hemorrhoids  RECOMMENDATIONS: 1) Hold aspirin, aspirin products, and anti-inflammatory medication for 1 week. 2) Await pathology results 3) High fiber diet. 4) Given your significant family history of colon cancer, you should have a repeat colonoscopy in 5 years  Carie Caddy. Rhea Belton, MD  CC:  Nelwyn Salisbury, MD The Patient  n. eSIGNED:   Carie Caddy. Uzair Godley at 04/16/2011 03:24 PM  Kallie Locks, [phone]

## 2011-04-16 NOTE — Patient Instructions (Signed)

## 2011-04-17 ENCOUNTER — Telehealth: Payer: Self-pay | Admitting: *Deleted

## 2011-04-17 NOTE — Telephone Encounter (Signed)
Left message on number given yest in admitting. ewm 

## 2011-04-20 ENCOUNTER — Other Ambulatory Visit: Payer: BC Managed Care – PPO | Admitting: Internal Medicine

## 2011-04-22 ENCOUNTER — Ambulatory Visit: Payer: BC Managed Care – PPO | Admitting: Family Medicine

## 2011-04-22 ENCOUNTER — Encounter: Payer: Self-pay | Admitting: Internal Medicine

## 2011-07-15 ENCOUNTER — Telehealth: Payer: Self-pay | Admitting: Family Medicine

## 2011-07-15 NOTE — Telephone Encounter (Signed)
Pt insurance has changed they no longer except 90 day refills and need all scripts sent to a local pharmacy. Pt is not sure what pharmacy he is going to use at this time and is requesting to pick up paper scripts on all medications. Please contact

## 2011-07-16 NOTE — Telephone Encounter (Signed)
I left a voice message for pt to return my call. I need to know if it is all 4 prescription medication and how many a 30 day or 90 day supply?

## 2011-07-16 NOTE — Telephone Encounter (Signed)
amLODipine-benazepril (LOTREL) 5-40 MG per capsule sertraline (ZOLOFT) 100 MG tablet  gabapentin (NEURONTIN) 300 MG capsule meloxicam (MOBIC) 15 MG tablet  temazepam (RESTORIL) 30 MG capsule  90 day refill

## 2011-07-17 MED ORDER — AMLODIPINE BESY-BENAZEPRIL HCL 5-40 MG PO CAPS: 1.0000 | ORAL_CAPSULE | Freq: Every day | ORAL | Status: DC

## 2011-07-17 MED ORDER — MELOXICAM 15 MG PO TABS: 15.0000 mg | ORAL_TABLET | Freq: Every day | ORAL | Status: DC

## 2011-07-17 MED ORDER — GABAPENTIN 300 MG PO CAPS: 300.0000 mg | ORAL_CAPSULE | Freq: Three times a day (TID) | ORAL | Status: DC

## 2011-07-17 MED ORDER — TEMAZEPAM 30 MG PO CAPS: 30.0000 mg | ORAL_CAPSULE | Freq: Every evening | ORAL | Status: DC | PRN

## 2011-07-17 MED ORDER — SERTRALINE HCL 100 MG PO TABS: 100.0000 mg | ORAL_TABLET | Freq: Every day | ORAL | Status: DC

## 2011-07-17 NOTE — Telephone Encounter (Signed)
Can I refill these?

## 2011-07-17 NOTE — Telephone Encounter (Signed)
Script is ready for pick up and I spoke with pt.  

## 2011-07-17 NOTE — Telephone Encounter (Signed)
done

## 2011-12-28 DIAGNOSIS — M6530 Trigger finger, unspecified finger: Secondary | ICD-10-CM | POA: Diagnosis not present

## 2012-01-07 ENCOUNTER — Telehealth: Payer: Self-pay | Admitting: Family Medicine

## 2012-01-07 NOTE — Telephone Encounter (Signed)
Patient Information:  Caller Name: Sora  Phone: [phone]  [name], Brandon Schroeder  Gender: Male  DOB: 1946-01-09  Age: 66 Years  PCP: Gershon Cr[facility])   Symptoms  Reason For Call & Symptoms: Patient is currently on Lotrel 5-40mg  1 cap dalily for several years. He has noticed a spike in blood pressure. He has noticed over last three weeks pressures are 144/95.  Complains of flushness in face and pain below his ears. 10 pound weight gain/not as active. Compliant with medication. Slight stress  Reviewed Health History In EMR: Yes  Reviewed Medications In EMR: Yes  Reviewed Allergies In EMR: Yes  Date of Onset of Symptoms: 12/17/2011  Guideline(s) Used:  High Blood Pressure  Disposition Per Guideline:   See Within 2 Weeks in Office  Reason For Disposition Reached:   BP > 140/90 and is taking BP medications  Advice Given:  BP 120-139 / 80-89   Sometimes, changes in your lifestyle can reduce your blood pressure without medications.  If your blood pressure stays elevated during the next month, you should go in to see the doctor and get your blood pressure checked.  General:  The goal of blood pressure treatment for most patients with hypertension is to keep the blood pressure under 140/90.  BP less than 120 / 80   This is considered normal blood pressure  Office Follow Up:  Does the office need to follow up with this patient?: Yes  Instructions For The Office: Elevated Blood pressure 144/95 for the last 3 weeks on medication and compliant. Request guidance . Medication adjustment  or appt?

## 2012-01-07 NOTE — Telephone Encounter (Signed)
Attempted callback for requested Triage at 11:53 am.  Obtained voicemail and left message to call back if any further assistance needed.

## 2012-01-08 ENCOUNTER — Encounter: Payer: Self-pay | Admitting: Family Medicine

## 2012-01-08 ENCOUNTER — Ambulatory Visit (INDEPENDENT_AMBULATORY_CARE_PROVIDER_SITE_OTHER): Payer: Medicare Other | Admitting: Family Medicine

## 2012-01-08 VITALS — BP 140/88 | HR 81 | Temp 98.7°F | Wt 232.0 lb

## 2012-01-08 DIAGNOSIS — Z23 Encounter for immunization: Secondary | ICD-10-CM | POA: Diagnosis not present

## 2012-01-08 DIAGNOSIS — I10 Essential (primary) hypertension: Secondary | ICD-10-CM | POA: Diagnosis not present

## 2012-01-08 MED ORDER — MELOXICAM 15 MG PO TABS: 15.0000 mg | ORAL_TABLET | Freq: Every day | ORAL | Status: DC

## 2012-01-08 MED ORDER — HYDROCHLOROTHIAZIDE 25 MG PO TABS: 25.0000 mg | ORAL_TABLET | Freq: Every day | ORAL | Status: DC

## 2012-01-08 MED ORDER — TEMAZEPAM 30 MG PO CAPS: 30.0000 mg | ORAL_CAPSULE | Freq: Every evening | ORAL | Status: DC | PRN

## 2012-01-08 MED ORDER — SERTRALINE HCL 100 MG PO TABS: 100.0000 mg | ORAL_TABLET | Freq: Every day | ORAL | Status: DC

## 2012-01-08 MED ORDER — GABAPENTIN 300 MG PO CAPS: 300.0000 mg | ORAL_CAPSULE | Freq: Three times a day (TID) | ORAL | Status: DC

## 2012-01-08 MED ORDER — FLUTICASONE PROPIONATE 50 MCG/ACT NA SUSP: 2.0000 | Freq: Every day | NASAL | Status: DC

## 2012-01-08 MED ORDER — AMLODIPINE BESY-BENAZEPRIL HCL 5-40 MG PO CAPS: 1.0000 | ORAL_CAPSULE | Freq: Every day | ORAL | Status: DC

## 2012-01-08 NOTE — Telephone Encounter (Signed)
He needs an appt 

## 2012-01-08 NOTE — Telephone Encounter (Signed)
Called pt and sch ov for 01/08/12 at 3:15 as noted.

## 2012-01-08 NOTE — Progress Notes (Signed)
  Subjective:    Patient ID: Brandon Schroeder, male    DOB: [birth year], 66 y.o.   MRN: [id number]  HPI Here for elevated BP to the 150s over 90s for the past 4 weeks. He has gained about 10 lbs during this time. No HAs or SOB or chest pains or foot swelling. He admits to being under a lot of stress lately and he is not exercising like he used to. No recent medication changes.    Review of Systems  Constitutional: Negative.   HENT: Negative.   Eyes: Negative.   Respiratory: Negative.   Cardiovascular: Negative.   Genitourinary: Negative.   Neurological: Negative.        Objective:   Physical Exam  Constitutional: He appears well-developed and well-nourished.  Neck: No thyromegaly present.  Cardiovascular: Normal rate, regular rhythm, normal heart sounds and intact distal pulses.   Pulmonary/Chest: Effort normal and breath sounds normal.  Musculoskeletal: He exhibits no edema.  Lymphadenopathy:    He has no cervical adenopathy.          Assessment & Plan:  He has a mild worsening of his BP, probably due to stress and some weight gain. Add HCTZ to his meds. Recheck in 2-3 weeks

## 2012-01-08 NOTE — Telephone Encounter (Signed)
Can you call pt and schedule the appointment?

## 2012-01-18 DIAGNOSIS — M6530 Trigger finger, unspecified finger: Secondary | ICD-10-CM | POA: Diagnosis not present

## 2012-01-28 DIAGNOSIS — M6530 Trigger finger, unspecified finger: Secondary | ICD-10-CM | POA: Diagnosis not present

## 2012-03-07 ENCOUNTER — Encounter: Payer: Self-pay | Admitting: Family Medicine

## 2012-03-07 ENCOUNTER — Ambulatory Visit (INDEPENDENT_AMBULATORY_CARE_PROVIDER_SITE_OTHER): Payer: Medicare Other | Admitting: Family Medicine

## 2012-03-07 VITALS — BP 120/84 | HR 86 | Temp 98.2°F | Wt 228.0 lb

## 2012-03-07 DIAGNOSIS — L408 Other psoriasis: Secondary | ICD-10-CM | POA: Diagnosis not present

## 2012-03-07 DIAGNOSIS — L409 Psoriasis, unspecified: Secondary | ICD-10-CM

## 2012-03-07 DIAGNOSIS — N390 Urinary tract infection, site not specified: Secondary | ICD-10-CM

## 2012-03-07 LAB — POCT URINALYSIS DIPSTICK
Nitrite, UA: NEGATIVE
Spec Grav, UA: >=1.03
Urobilinogen, UA: 0.2
pH, UA: 6

## 2012-03-07 MED ORDER — MELOXICAM 15 MG PO TABS: 15.0000 mg | ORAL_TABLET | Freq: Every day | ORAL | Status: DC

## 2012-03-07 MED ORDER — GABAPENTIN 300 MG PO CAPS: 300.0000 mg | ORAL_CAPSULE | Freq: Three times a day (TID) | ORAL | Status: DC

## 2012-03-07 MED ORDER — SULFAMETHOXAZOLE-TRIMETHOPRIM 800-160 MG PO TABS: 1.0000 | ORAL_TABLET | Freq: Two times a day (BID) | ORAL | Status: DC

## 2012-03-07 MED ORDER — SERTRALINE HCL 100 MG PO TABS: 100.0000 mg | ORAL_TABLET | Freq: Every day | ORAL | Status: DC

## 2012-03-07 MED ORDER — AMLODIPINE BESY-BENAZEPRIL HCL 5-40 MG PO CAPS: 1.0000 | ORAL_CAPSULE | Freq: Every day | ORAL | Status: DC

## 2012-03-07 MED ORDER — FLUTICASONE PROPIONATE 50 MCG/ACT NA SUSP: 2.0000 | Freq: Every day | NASAL | Status: DC

## 2012-03-07 NOTE — Addendum Note (Signed)
Addended by: Aniceto Boss A on: 03/07/2012 12:53 PM   Modules accepted: Orders

## 2012-03-07 NOTE — Progress Notes (Signed)
  Subjective:    Patient ID: Brandon Schroeder, male    DOB: 1945/03/06, 67 y.o.   MRN: [id number]  HPI Here for several issues. First for the past month he has had a burning on urination but no urgency or frequency. No blood visible. He drinks plenty of water. Also he has had a spot on his chest for months which itches at times, but in the past month it has gotten red and is more itchy than ever.    Review of Systems  Constitutional: Negative.   Gastrointestinal: Negative.   Genitourinary: Positive for dysuria. Negative for urgency, frequency, hematuria, flank pain, discharge and testicular pain.  Skin: Positive for rash.       Objective:   Physical Exam  Constitutional: He appears well-developed and well-nourished. No distress.  Abdominal: Soft. Bowel sounds are normal. He exhibits no distension and no mass. There is no tenderness. There is no rebound and no guarding.  Skin:       There is a raised plaque of skin in the area over the xiphoid which is intensely red and blanches. This is scaly and tender. He has numerous other smaller scaly plaques on the trunk and over the elbows          Assessment & Plan:  He has a UTI, so we will treat with Septra DS for 10 days. Culture the urine sample. He has psoriasis, and the lesion on the chest may simply be an inflamed psoriatic plaque. However this may need to be biopsied, so we will refer to Dermatology.

## 2012-03-09 DIAGNOSIS — H2510 Age-related nuclear cataract, unspecified eye: Secondary | ICD-10-CM | POA: Diagnosis not present

## 2012-03-09 LAB — URINE CULTURE
Colony Count: NO GROWTH
Organism ID, Bacteria: NO GROWTH

## 2012-03-11 NOTE — Progress Notes (Signed)
Quick Note:  I tried to reach pt by phone, no answer. ______ 

## 2012-03-24 DIAGNOSIS — H2510 Age-related nuclear cataract, unspecified eye: Secondary | ICD-10-CM | POA: Diagnosis not present

## 2012-03-24 DIAGNOSIS — H2589 Other age-related cataract: Secondary | ICD-10-CM | POA: Diagnosis not present

## 2012-03-28 ENCOUNTER — Encounter: Payer: BC Managed Care – PPO | Admitting: Family Medicine

## 2012-03-28 DIAGNOSIS — L738 Other specified follicular disorders: Secondary | ICD-10-CM | POA: Diagnosis not present

## 2012-03-28 DIAGNOSIS — D485 Neoplasm of uncertain behavior of skin: Secondary | ICD-10-CM | POA: Diagnosis not present

## 2012-03-28 DIAGNOSIS — L219 Seborrheic dermatitis, unspecified: Secondary | ICD-10-CM | POA: Diagnosis not present

## 2012-03-28 DIAGNOSIS — L419 Parapsoriasis, unspecified: Secondary | ICD-10-CM | POA: Diagnosis not present

## 2012-03-28 DIAGNOSIS — D235 Other benign neoplasm of skin of trunk: Secondary | ICD-10-CM | POA: Diagnosis not present

## 2012-03-28 DIAGNOSIS — Z0289 Encounter for other administrative examinations: Secondary | ICD-10-CM

## 2012-04-21 ENCOUNTER — Other Ambulatory Visit: Payer: Self-pay | Admitting: Family Medicine

## 2012-04-28 ENCOUNTER — Encounter: Payer: Self-pay | Admitting: Family Medicine

## 2012-04-28 ENCOUNTER — Ambulatory Visit (INDEPENDENT_AMBULATORY_CARE_PROVIDER_SITE_OTHER): Payer: Medicare Other | Admitting: Family Medicine

## 2012-04-28 VITALS — BP 112/80 | HR 90 | Temp 98.8°F | Ht 74.5 in | Wt 222.0 lb

## 2012-04-28 DIAGNOSIS — I10 Essential (primary) hypertension: Secondary | ICD-10-CM

## 2012-04-28 LAB — HEPATIC FUNCTION PANEL
AST: 16 U/L (ref 0–37)
Total Bilirubin: 0.8 mg/dL (ref 0.3–1.2)

## 2012-04-28 LAB — PSA: PSA: 3.53 ng/mL (ref 0.10–4.00)

## 2012-04-28 LAB — POCT URINALYSIS DIPSTICK
Bilirubin, UA: NEGATIVE
Ketones, UA: NEGATIVE

## 2012-04-28 LAB — CBC WITH DIFFERENTIAL/PLATELET
Basophils Relative: 0.3 % (ref 0.0–3.0)
Hemoglobin: 13.9 g/dL (ref 13.0–17.0)
Lymphocytes Relative: 21 % (ref 12.0–46.0)
Monocytes Relative: 7.7 % (ref 3.0–12.0)
Neutro Abs: 3.6 10*3/uL (ref 1.4–7.7)
RBC: 4.29 Mil/uL (ref 4.22–5.81)
WBC: 5.2 10*3/uL (ref 4.5–10.5)

## 2012-04-28 LAB — LIPID PANEL
LDL Cholesterol: 100 mg/dL — ABNORMAL HIGH (ref 0–99)
Total CHOL/HDL Ratio: 4
Triglycerides: 56 mg/dL (ref 0.0–149.0)

## 2012-04-28 LAB — TSH: TSH: 0.93 u[IU]/mL (ref 0.35–5.50)

## 2012-04-28 LAB — BASIC METABOLIC PANEL
Chloride: 103 mEq/L (ref 96–112)
Creatinine, Ser: 1.2 mg/dL (ref 0.4–1.5)

## 2012-04-28 MED ORDER — SERTRALINE HCL 100 MG PO TABS: 200.0000 mg | ORAL_TABLET | Freq: Every day | ORAL | Status: DC

## 2012-04-28 MED ORDER — OMEPRAZOLE 40 MG PO CPDR: 40.0000 mg | DELAYED_RELEASE_CAPSULE | Freq: Every day | ORAL | Status: DC

## 2012-04-28 NOTE — Progress Notes (Signed)
  Subjective:    Patient ID: Brandon Schroeder, male    DOB: [birth year], 67 y.o.   MRN: [id number]  HPI 67 yr old male for a cpx. He has a few issues to discuss. First he has had several months of dull aching pains in the center of the abdomen which are present when he wakes up and last all day until dinnertime. These are mild. He has a poor appetite during the day and usually eats no food at all during the day until dinnertime. He eats a typical dinner at night, and this pain goes away. He has no discomfort in the evenings at all. He gets nauseated slightly at times but never vomits. His BMs are regular in timing and consistency. No fevers. No weight changes. Also he has felt more depressed lately. He has been onn Zoloft for about 2 years and this has been very helpful until lately. Now he feels the same fatigue, sadness and lack of motivation as before. He sleeps well.    Review of Systems  Constitutional: Negative.   HENT: Negative.   Eyes: Negative.   Respiratory: Negative.   Cardiovascular: Negative.   Gastrointestinal: Positive for nausea and abdominal pain. Negative for vomiting, diarrhea, constipation, blood in stool and abdominal distention.  Genitourinary: Negative.   Musculoskeletal: Negative.   Skin: Negative.   Neurological: Negative.   Psychiatric/Behavioral: Positive for dysphoric mood. Negative for suicidal ideas, hallucinations, behavioral problems, confusion, sleep disturbance, self-injury, decreased concentration and agitation. The patient is not nervous/anxious and is not hyperactive.        Objective:   Physical Exam  Constitutional: He is oriented to person, place, and time. He appears well-developed and well-nourished. No distress.  HENT:  Head: Normocephalic and atraumatic.  Right Ear: External ear normal.  Left Ear: External ear normal.  Nose: Nose normal.  Mouth/Throat: Oropharynx is clear and moist. No oropharyngeal exudate.  Eyes: Conjunctivae and EOM are normal.  Pupils are equal, round, and reactive to light. Right eye exhibits no discharge. Left eye exhibits no discharge. No scleral icterus.  Neck: Neck supple. No JVD present. No tracheal deviation present. No thyromegaly present.  Cardiovascular: Normal rate, regular rhythm, normal heart sounds and intact distal pulses.  Exam reveals no gallop and no friction rub.   No murmur heard. EKG normal  Pulmonary/Chest: Effort normal and breath sounds normal. No respiratory distress. He has no wheezes. He has no rales. He exhibits no tenderness.  Abdominal: Soft. Bowel sounds are normal. He exhibits no distension and no mass. There is no tenderness. There is no rebound and no guarding.  Genitourinary: Rectum normal, prostate normal and penis normal. Guaiac negative stool. No penile tenderness.  Musculoskeletal: Normal range of motion. He exhibits no edema and no tenderness.  Lymphadenopathy:    He has no cervical adenopathy.  Neurological: He is alert and oriented to person, place, and time. He has normal reflexes. No cranial nerve deficit. He exhibits normal muscle tone. Coordination normal.  Skin: Skin is warm and dry. No rash noted. He is not diaphoretic. No erythema. No pallor.  Psychiatric: He has a normal mood and affect. His behavior is normal. Judgment and thought content normal.          Assessment & Plan:  Well exam. We will increase Zoloft to 200 mg a day. His abdominal pain is likely gastritis and he needs to eat 3 meals a day. Add Omeprazole each am. Recheck one month.

## 2012-05-03 NOTE — Progress Notes (Signed)
Quick Note:  Pt has appointment on 05/05/12 will go over then. ______

## 2012-05-05 ENCOUNTER — Ambulatory Visit (INDEPENDENT_AMBULATORY_CARE_PROVIDER_SITE_OTHER)
Admission: RE | Admit: 2012-05-05 | Discharge: 2012-05-05 | Disposition: A | Discharge status: Home / Self Care | Payer: Medicare Other | Source: Ambulatory Visit | Attending: Family Medicine | Admitting: Family Medicine

## 2012-05-16 NOTE — Progress Notes (Signed)
Quick Note:  I spoke with pt and went over below information. ______

## 2012-05-18 DIAGNOSIS — H43819 Vitreous degeneration, unspecified eye: Secondary | ICD-10-CM | POA: Diagnosis not present

## 2012-05-19 DIAGNOSIS — H4310 Vitreous hemorrhage, unspecified eye: Secondary | ICD-10-CM | POA: Diagnosis not present

## 2012-05-19 DIAGNOSIS — H43819 Vitreous degeneration, unspecified eye: Secondary | ICD-10-CM | POA: Diagnosis not present

## 2012-06-02 DIAGNOSIS — H43819 Vitreous degeneration, unspecified eye: Secondary | ICD-10-CM | POA: Diagnosis not present

## 2012-06-02 DIAGNOSIS — H43399 Other vitreous opacities, unspecified eye: Secondary | ICD-10-CM | POA: Diagnosis not present

## 2012-06-06 ENCOUNTER — Other Ambulatory Visit: Payer: Self-pay | Admitting: Family Medicine

## 2012-07-12 DIAGNOSIS — M6530 Trigger finger, unspecified finger: Secondary | ICD-10-CM | POA: Diagnosis not present

## 2012-07-14 DIAGNOSIS — H43819 Vitreous degeneration, unspecified eye: Secondary | ICD-10-CM | POA: Diagnosis not present

## 2012-07-16 ENCOUNTER — Other Ambulatory Visit: Payer: Self-pay | Admitting: Family Medicine

## 2012-07-19 DIAGNOSIS — M6530 Trigger finger, unspecified finger: Secondary | ICD-10-CM | POA: Diagnosis not present

## 2012-07-19 NOTE — Telephone Encounter (Signed)
Okay for 6 months 

## 2012-07-22 DIAGNOSIS — M6530 Trigger finger, unspecified finger: Secondary | ICD-10-CM | POA: Diagnosis not present

## 2012-07-26 DIAGNOSIS — M6530 Trigger finger, unspecified finger: Secondary | ICD-10-CM | POA: Diagnosis not present

## 2012-07-28 DIAGNOSIS — M6530 Trigger finger, unspecified finger: Secondary | ICD-10-CM | POA: Diagnosis not present

## 2012-08-02 DIAGNOSIS — M6530 Trigger finger, unspecified finger: Secondary | ICD-10-CM | POA: Diagnosis not present

## 2012-08-04 DIAGNOSIS — M6530 Trigger finger, unspecified finger: Secondary | ICD-10-CM | POA: Diagnosis not present

## 2012-08-08 DIAGNOSIS — M6530 Trigger finger, unspecified finger: Secondary | ICD-10-CM | POA: Diagnosis not present

## 2012-08-12 DIAGNOSIS — M6530 Trigger finger, unspecified finger: Secondary | ICD-10-CM | POA: Diagnosis not present

## 2012-08-16 DIAGNOSIS — M6530 Trigger finger, unspecified finger: Secondary | ICD-10-CM | POA: Diagnosis not present

## 2012-08-23 DIAGNOSIS — M19049 Primary osteoarthritis, unspecified hand: Secondary | ICD-10-CM | POA: Diagnosis not present

## 2012-08-30 DIAGNOSIS — M6530 Trigger finger, unspecified finger: Secondary | ICD-10-CM | POA: Diagnosis not present

## 2012-09-21 ENCOUNTER — Other Ambulatory Visit: Payer: Self-pay

## 2012-09-24 ENCOUNTER — Emergency Department (HOSPITAL_COMMUNITY)
Admission: EM | Admit: 2012-09-24 | Discharge: 2012-09-24 | Disposition: A | Discharge status: Home / Self Care | Payer: No Typology Code available for payment source | Attending: Emergency Medicine | Admitting: Emergency Medicine

## 2012-09-24 ENCOUNTER — Emergency Department (HOSPITAL_COMMUNITY): Payer: No Typology Code available for payment source

## 2012-09-24 ENCOUNTER — Encounter (HOSPITAL_COMMUNITY): Payer: Self-pay | Admitting: Emergency Medicine

## 2012-09-24 DIAGNOSIS — S0993XA Unspecified injury of face, initial encounter: Secondary | ICD-10-CM | POA: Diagnosis not present

## 2012-09-24 DIAGNOSIS — Z7982 Long term (current) use of aspirin: Secondary | ICD-10-CM | POA: Diagnosis not present

## 2012-09-24 DIAGNOSIS — M545 Low back pain, unspecified: Secondary | ICD-10-CM | POA: Diagnosis not present

## 2012-09-24 DIAGNOSIS — R42 Dizziness and giddiness: Secondary | ICD-10-CM | POA: Diagnosis not present

## 2012-09-24 DIAGNOSIS — J309 Allergic rhinitis, unspecified: Secondary | ICD-10-CM | POA: Diagnosis not present

## 2012-09-24 DIAGNOSIS — F329 Major depressive disorder, single episode, unspecified: Secondary | ICD-10-CM | POA: Insufficient documentation

## 2012-09-24 DIAGNOSIS — I10 Essential (primary) hypertension: Secondary | ICD-10-CM | POA: Insufficient documentation

## 2012-09-24 DIAGNOSIS — S139XXA Sprain of joints and ligaments of unspecified parts of neck, initial encounter: Secondary | ICD-10-CM | POA: Diagnosis not present

## 2012-09-24 DIAGNOSIS — Y92410 Unspecified street and highway as the place of occurrence of the external cause: Secondary | ICD-10-CM | POA: Insufficient documentation

## 2012-09-24 DIAGNOSIS — F3289 Other specified depressive episodes: Secondary | ICD-10-CM | POA: Insufficient documentation

## 2012-09-24 DIAGNOSIS — G589 Mononeuropathy, unspecified: Secondary | ICD-10-CM | POA: Diagnosis not present

## 2012-09-24 DIAGNOSIS — IMO0002 Reserved for concepts with insufficient information to code with codable children: Secondary | ICD-10-CM | POA: Insufficient documentation

## 2012-09-24 DIAGNOSIS — K219 Gastro-esophageal reflux disease without esophagitis: Secondary | ICD-10-CM | POA: Insufficient documentation

## 2012-09-24 DIAGNOSIS — T148XXA Other injury of unspecified body region, initial encounter: Secondary | ICD-10-CM

## 2012-09-24 DIAGNOSIS — S0990XA Unspecified injury of head, initial encounter: Secondary | ICD-10-CM | POA: Diagnosis not present

## 2012-09-24 DIAGNOSIS — F99 Mental disorder, not otherwise specified: Secondary | ICD-10-CM | POA: Insufficient documentation

## 2012-09-24 DIAGNOSIS — S199XXA Unspecified injury of neck, initial encounter: Secondary | ICD-10-CM | POA: Diagnosis not present

## 2012-09-24 DIAGNOSIS — Z79899 Other long term (current) drug therapy: Secondary | ICD-10-CM | POA: Diagnosis not present

## 2012-09-24 DIAGNOSIS — Y9389 Activity, other specified: Secondary | ICD-10-CM | POA: Insufficient documentation

## 2012-09-24 DIAGNOSIS — M542 Cervicalgia: Secondary | ICD-10-CM | POA: Diagnosis not present

## 2012-09-24 DIAGNOSIS — R4182 Altered mental status, unspecified: Secondary | ICD-10-CM | POA: Diagnosis not present

## 2012-09-24 DIAGNOSIS — Z880 Allergy status to penicillin: Secondary | ICD-10-CM | POA: Diagnosis not present

## 2012-09-24 DIAGNOSIS — R51 Headache: Secondary | ICD-10-CM | POA: Diagnosis not present

## 2012-09-24 MED ORDER — IBUPROFEN 800 MG PO TABS: 800.0000 mg | ORAL_TABLET | Freq: Three times a day (TID) | ORAL | Status: DC

## 2012-09-24 MED ORDER — CYCLOBENZAPRINE HCL 10 MG PO TABS: 10.0000 mg | ORAL_TABLET | Freq: Two times a day (BID) | ORAL | Status: DC | PRN

## 2012-09-24 NOTE — ED Notes (Signed)
YNW:[id number]> Expected date:<BR> Expected time:<BR> Means of arrival:<BR> Comments:<BR> Hold triage 2
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
## 2012-09-24 NOTE — ED Notes (Signed)
Patient on stretcher in  No acute distress.Hand grips strong bilaterally as were pedal push/pull.  States dizzy when Standing.

## 2012-09-29 NOTE — ED Provider Notes (Signed)
Medical screening examination/treatment/procedure(s) were performed by non-physician practitioner and as supervising physician I was immediately available for consultation/collaboration.  Anelly Samarin T Adlene Adduci, MD 09/29/12 1218 

## 2012-10-20 ENCOUNTER — Encounter: Payer: Self-pay | Admitting: Family Medicine

## 2012-10-20 ENCOUNTER — Other Ambulatory Visit: Payer: Self-pay | Admitting: Family Medicine

## 2012-10-20 ENCOUNTER — Ambulatory Visit (INDEPENDENT_AMBULATORY_CARE_PROVIDER_SITE_OTHER): Payer: Medicare Other | Admitting: Family Medicine

## 2012-10-20 VITALS — BP 110/70 | HR 98 | Temp 98.1°F | Wt 220.0 lb

## 2012-10-20 DIAGNOSIS — R413 Other amnesia: Secondary | ICD-10-CM | POA: Diagnosis not present

## 2012-10-20 DIAGNOSIS — Z23 Encounter for immunization: Secondary | ICD-10-CM

## 2012-10-20 DIAGNOSIS — S161XXD Strain of muscle, fascia and tendon at neck level, subsequent encounter: Secondary | ICD-10-CM

## 2012-10-20 DIAGNOSIS — F29 Unspecified psychosis not due to a substance or known physiological condition: Secondary | ICD-10-CM | POA: Diagnosis not present

## 2012-10-20 DIAGNOSIS — Z5189 Encounter for other specified aftercare: Secondary | ICD-10-CM | POA: Diagnosis not present

## 2012-10-20 DIAGNOSIS — R410 Disorientation, unspecified: Secondary | ICD-10-CM
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
## 2012-10-27 DIAGNOSIS — S335XXA Sprain of ligaments of lumbar spine, initial encounter: Secondary | ICD-10-CM | POA: Diagnosis not present

## 2012-10-27 DIAGNOSIS — M19049 Primary osteoarthritis, unspecified hand: Secondary | ICD-10-CM | POA: Diagnosis not present

## 2012-10-27 DIAGNOSIS — M25519 Pain in unspecified shoulder: Secondary | ICD-10-CM | POA: Diagnosis not present

## 2012-10-27 DIAGNOSIS — M6530 Trigger finger, unspecified finger: Secondary | ICD-10-CM | POA: Diagnosis not present

## 2012-10-27 DIAGNOSIS — M47817 Spondylosis without myelopathy or radiculopathy, lumbosacral region: Secondary | ICD-10-CM | POA: Diagnosis not present

## 2012-10-27 DIAGNOSIS — IMO0002 Reserved for concepts with insufficient information to code with codable children: Secondary | ICD-10-CM | POA: Diagnosis not present

## 2012-11-03 ENCOUNTER — Encounter: Payer: Self-pay | Admitting: Neurology

## 2012-11-03 ENCOUNTER — Ambulatory Visit (INDEPENDENT_AMBULATORY_CARE_PROVIDER_SITE_OTHER): Payer: Medicare Other | Admitting: Neurology

## 2012-11-03 VITALS — BP 90/60 | HR 90 | Temp 97.8°F | Ht 74.5 in | Wt 227.0 lb

## 2012-11-03 DIAGNOSIS — R413 Other amnesia: Secondary | ICD-10-CM
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
## 2012-11-07 ENCOUNTER — Ambulatory Visit: Payer: Medicare Other

## 2012-11-08 ENCOUNTER — Encounter: Payer: Self-pay | Admitting: Neurology

## 2012-11-08 DIAGNOSIS — M545 Low back pain: Secondary | ICD-10-CM | POA: Diagnosis not present

## 2012-11-08 LAB — METHYLMALONIC ACID, SERUM: Methylmalonic Acid, Quant: 0.2 umol/L (ref <0.40)

## 2012-12-05 DIAGNOSIS — M5126 Other intervertebral disc displacement, lumbar region: Secondary | ICD-10-CM | POA: Diagnosis not present

## 2012-12-05 DIAGNOSIS — IMO0002 Reserved for concepts with insufficient information to code with codable children: Secondary | ICD-10-CM | POA: Diagnosis not present

## 2012-12-05 DIAGNOSIS — M47817 Spondylosis without myelopathy or radiculopathy, lumbosacral region: Secondary | ICD-10-CM | POA: Diagnosis not present

## 2012-12-23 ENCOUNTER — Ambulatory Visit (INDEPENDENT_AMBULATORY_CARE_PROVIDER_SITE_OTHER): Payer: Medicare Other | Admitting: Family Medicine

## 2012-12-23 ENCOUNTER — Ambulatory Visit: Payer: Medicare Other | Admitting: Family Medicine

## 2012-12-23 ENCOUNTER — Encounter: Payer: Self-pay | Admitting: Family Medicine

## 2012-12-23 VITALS — BP 120/80 | HR 82 | Temp 98.2°F | Wt 234.0 lb

## 2012-12-23 DIAGNOSIS — R233 Spontaneous ecchymoses: Secondary | ICD-10-CM

## 2012-12-23 DIAGNOSIS — R252 Cramp and spasm: Secondary | ICD-10-CM

## 2012-12-23 LAB — CBC WITH DIFFERENTIAL/PLATELET
Basophils Absolute: 0 10*3/uL (ref 0.0–0.1)
Eosinophils Absolute: 0.1 10*3/uL (ref 0.0–0.7)
Eosinophils Relative: 1 % (ref 0–5)
Lymphocytes Relative: 26 % (ref 12–46)
MCV: 91.7 fL (ref 78.0–100.0)
Neutrophils Relative %: 63 % (ref 43–77)
Platelets: 126 10*3/uL — ABNORMAL LOW (ref 150–400)
RDW: 14.3 % (ref 11.5–15.5)
WBC: 5.5 10*3/uL (ref 4.0–10.5)

## 2012-12-23 LAB — BASIC METABOLIC PANEL
BUN: 23 mg/dL (ref 6–23)
Calcium: 9.2 mg/dL (ref 8.4–10.5)
Chloride: 101 mEq/L (ref 96–112)
Creat: 1.25 mg/dL (ref 0.50–1.35)
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
## 2012-12-23 NOTE — Patient Instructions (Signed)
Leg Cramps  Leg cramps that occur during exercise can be caused by poor circulation or dehydration. However, muscle cramps that occur at rest or during the night are usually not due to any serious medical problem. Heat cramps may cause muscle spasms during hot weather.   CAUSES  There is no clear cause for muscle cramps. However, dehydration may be a factor for those who do not drink enough fluids and those who exercise in the heat. Imbalances in the level of sodium, potassium, calcium or magnesium in the muscle tissue may also be a factor. Some medications, such as water pills (diuretics), may cause loss of chemicals that the body needs (like sodium and potassium) and cause muscle cramps.  TREATMENT   · Make sure your diet has enough fluids and essential minerals for the muscle to work normally.  · Avoid strenuous exercise for several days if you have been having frequent leg cramps.  · Stretch and massage the cramped muscle for several minutes.  · Some medicines may be helpful in some patients with night cramps. Only take over-the-counter or prescription medicines as directed by your caregiver.  SEEK IMMEDIATE MEDICAL CARE IF:   · Your leg cramps become worse.  · Your foot becomes cold, numb, or blue.  Document Released: 03/12/2004 Document Revised: 04/27/2011 Document Reviewed: 02/28/2008  ExitCare® Patient Information ©2014 [employer].

## 2012-12-25 ENCOUNTER — Encounter: Payer: Self-pay | Admitting: Family Medicine

## 2012-12-26 ENCOUNTER — Telehealth: Payer: Self-pay | Admitting: Family Medicine

## 2012-12-26 ENCOUNTER — Ambulatory Visit: Payer: Medicare Other | Admitting: Family Medicine

## 2012-12-26 NOTE — Telephone Encounter (Signed)
Noted  

## 2012-12-26 NOTE — Telephone Encounter (Signed)
TC to patient regarding vomiting and constipation.  On callback, RN unable to reach patient at number given; message left on identified voicemail to contact office for assistance.  krs/can

## 2012-12-27 ENCOUNTER — Ambulatory Visit (INDEPENDENT_AMBULATORY_CARE_PROVIDER_SITE_OTHER): Payer: Medicare Other | Admitting: Family Medicine

## 2012-12-27 ENCOUNTER — Encounter: Payer: Self-pay | Admitting: Family Medicine

## 2012-12-27 VITALS — BP 118/80 | HR 89 | Temp 98.5°F | Wt 225.0 lb

## 2012-12-27 DIAGNOSIS — R1033 Periumbilical pain: Secondary | ICD-10-CM | POA: Diagnosis not present

## 2012-12-27 DIAGNOSIS — R252 Cramp and spasm: Secondary | ICD-10-CM | POA: Diagnosis not present

## 2012-12-27 DIAGNOSIS — K5900 Constipation, unspecified: Secondary | ICD-10-CM

## 2012-12-27 NOTE — Progress Notes (Signed)
Pre visit review using our clinic review tool, if applicable. No additional management support is needed unless otherwise documented below in the visit note. 

## 2012-12-27 NOTE — Progress Notes (Signed)
  Subjective:    Patient ID: Brandon Schroeder, male    DOB: [birth year], 67 y.o.   MRN: [id number]  HPI Here for 2 weeks of constipation, which is a little unusual for him. This started after Dr. Naaman Plummer gave him an epidural steroid shot for his back. He is drinking plenty of fluids. He feels bloated and nauseated, and his appetite is down. Taking Colace twice a day.    Review of Systems  Constitutional: Negative.   Gastrointestinal: Positive for nausea, abdominal pain, constipation and abdominal distention. Negative for vomiting, diarrhea, blood in stool, anal bleeding and rectal pain.       Objective:   Physical Exam  Constitutional: He appears well-developed and well-nourished. No distress.  Cardiovascular: Normal rate, regular rhythm, normal heart sounds and intact distal pulses.   Pulmonary/Chest: Effort normal and breath sounds normal.  Abdominal: Soft. Bowel sounds are normal. He exhibits no distension and no mass. There is no rebound and no guarding.  Mild diffuse tenderness           Assessment & Plan:  Add Miralax 18 gm bid and MOM one tbsp tid. Recheck in one week

## 2012-12-27 NOTE — Telephone Encounter (Signed)
Called to check on pt and left a vm for pt to return call if he still needed assistance.

## 2013-01-16 DIAGNOSIS — M25519 Pain in unspecified shoulder: Secondary | ICD-10-CM | POA: Diagnosis not present

## 2013-01-19 ENCOUNTER — Other Ambulatory Visit: Payer: Self-pay | Admitting: Family Medicine

## 2013-01-25 ENCOUNTER — Telehealth: Payer: Self-pay | Admitting: Family Medicine

## 2013-01-25 NOTE — Telephone Encounter (Signed)
Pt has nausea/vomiting, drainage, diarehea , chest congestion, would like to see dr fry. Nothing except same day on thurs. pls advise.Marland Kitchen

## 2013-01-25 NOTE — Telephone Encounter (Signed)
I spoke with pt and he is going to schedule for 01/26/13 to see Dr. Clent Ridges. He did not want to go to a Urgent Care.

## 2013-01-26 ENCOUNTER — Ambulatory Visit (INDEPENDENT_AMBULATORY_CARE_PROVIDER_SITE_OTHER): Payer: Medicare Other | Admitting: Family Medicine

## 2013-01-26 ENCOUNTER — Encounter: Payer: Self-pay | Admitting: Family Medicine

## 2013-01-26 VITALS — BP 120/80 | HR 108 | Temp 98.5°F | Wt 225.0 lb

## 2013-01-26 DIAGNOSIS — J209 Acute bronchitis, unspecified: Secondary | ICD-10-CM

## 2013-01-26 DIAGNOSIS — G4700 Insomnia, unspecified: Secondary | ICD-10-CM | POA: Diagnosis not present

## 2013-01-26 MED ORDER — LORAZEPAM 1 MG PO TABS: 1.0000 mg | ORAL_TABLET | Freq: Three times a day (TID) | ORAL | Status: DC | PRN

## 2013-01-26 MED ORDER — CEFUROXIME AXETIL 500 MG PO TABS: 500.0000 mg | ORAL_TABLET | Freq: Two times a day (BID) | ORAL | Status: DC

## 2013-01-26 MED ORDER — TEMAZEPAM 30 MG PO CAPS: ORAL_CAPSULE | ORAL | Status: DC

## 2013-01-26 NOTE — Progress Notes (Signed)
   Subjective:    Patient ID: Bjorn Loser, male    DOB: Jun 27, 1945, 67 y.o.   MRN: [id number]  HPI Here for one week of sinus pressure , PND, ST, coughing up yellow mucus, and some mild nausea and loose stools. No fever. On Mucinex D. He also mentions that 2-3 times a month his Temazepam does not help with sleep. He sometimes cannot relax and lies awake for hours. He asks if there is something he could take in the middle of the night for this.    Review of Systems  Constitutional: Negative.   HENT: Positive for congestion and postnasal drip.   Eyes: Negative.   Respiratory: Positive for cough.   Gastrointestinal: Positive for nausea and diarrhea. Negative for vomiting, abdominal pain, constipation, blood in stool and abdominal distention.       Objective:   Physical Exam  Constitutional: He appears well-developed and well-nourished. No distress.  HENT:  Right Ear: External ear normal.  Left Ear: External ear normal.  Nose: Nose normal.  Mouth/Throat: Oropharynx is clear and moist.  Eyes: Conjunctivae are normal.  Pulmonary/Chest: Effort normal and breath sounds normal.  Lymphadenopathy:    He has no cervical adenopathy.          Assessment & Plan:  Given Ceftin for the sinusitis and bronchitis. Try Ativan for sleep.

## 2013-01-26 NOTE — Progress Notes (Signed)
Pre visit review using our clinic review tool, if applicable. No additional management support is needed unless otherwise documented below in the visit note. 

## 2013-02-14 ENCOUNTER — Encounter: Payer: Self-pay | Admitting: Internal Medicine

## 2013-02-14 ENCOUNTER — Ambulatory Visit (INDEPENDENT_AMBULATORY_CARE_PROVIDER_SITE_OTHER): Payer: Medicare Other | Admitting: Internal Medicine

## 2013-02-14 VITALS — BP 110/78 | HR 103 | Temp 97.8°F | Resp 20 | Wt 229.0 lb

## 2013-02-14 DIAGNOSIS — Z23 Encounter for immunization: Secondary | ICD-10-CM

## 2013-02-14 MED ORDER — LORAZEPAM 1 MG PO TABS: 1.0000 mg | ORAL_TABLET | Freq: Three times a day (TID) | ORAL | Status: DC | PRN

## 2013-02-14 NOTE — Progress Notes (Signed)
Pre-visit discussion using our clinic review tool. No additional management support is needed unless otherwise documented below in the visit note.  

## 2013-02-14 NOTE — Patient Instructions (Signed)
Hold hydrochlorothiazide   Take over-the-counter expectorants and cough medications such as  Mucinex DM.  Call if there is no improvement in 5 to 7 days or if he developed worsening cough, fever, or new symptoms, such as shortness of breath or chest pain.
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
## 2013-02-16 ENCOUNTER — Other Ambulatory Visit: Payer: Self-pay | Admitting: Family Medicine

## 2013-03-13 ENCOUNTER — Ambulatory Visit (INDEPENDENT_AMBULATORY_CARE_PROVIDER_SITE_OTHER): Payer: Medicare Other | Admitting: Family Medicine

## 2013-03-13 ENCOUNTER — Encounter: Payer: Self-pay | Admitting: Family Medicine

## 2013-03-13 VITALS — BP 118/68 | HR 86 | Temp 97.7°F | Wt 230.0 lb

## 2013-03-13 DIAGNOSIS — R300 Dysuria: Secondary | ICD-10-CM | POA: Diagnosis not present

## 2013-03-13 LAB — POCT URINALYSIS DIPSTICK
BILIRUBIN UA: NEGATIVE
Glucose, UA: NEGATIVE
KETONES UA: NEGATIVE
Nitrite, UA: NEGATIVE
SPEC GRAV UA: 1.025
Urobilinogen, UA: 0.2
pH, UA: 6

## 2013-03-13 MED ORDER — CEPHALEXIN 500 MG PO CAPS: 500.0000 mg | ORAL_CAPSULE | Freq: Three times a day (TID) | ORAL | Status: DC

## 2013-03-13 NOTE — Patient Instructions (Signed)
Urinary Tract Infection  Urinary tract infections (UTIs) can develop anywhere along your urinary tract. Your urinary tract is your body's drainage system for removing wastes and extra water. Your urinary tract includes two kidneys, two ureters, a bladder, and a urethra. Your kidneys are a pair of bean-shaped organs. Each kidney is about the size of your fist. They are located below your ribs, one on each side of your spine.  CAUSES  Infections are caused by microbes, which are microscopic organisms, including fungi, viruses, and bacteria. These organisms are so small that they can only be seen through a microscope. Bacteria are the microbes that most commonly cause UTIs.  SYMPTOMS   Symptoms of UTIs may vary by age and gender of the patient and by the location of the infection. Symptoms in young women typically include a frequent and intense urge to urinate and a painful, burning feeling in the bladder or urethra during urination. Older women and men are more likely to be tired, shaky, and weak and have muscle aches and abdominal pain. A fever may mean the infection is in your kidneys. Other symptoms of a kidney infection include pain in your back or sides below the ribs, nausea, and vomiting.  DIAGNOSIS  To diagnose a UTI, your caregiver will ask you about your symptoms. Your caregiver also will ask to provide a urine sample. The urine sample will be tested for bacteria and white blood cells. White blood cells are made by your body to help fight infection.  TREATMENT   Typically, UTIs can be treated with medication. Because most UTIs are caused by a bacterial infection, they usually can be treated with the use of antibiotics. The choice of antibiotic and length of treatment depend on your symptoms and the type of bacteria causing your infection.  HOME CARE INSTRUCTIONS   If you were prescribed antibiotics, take them exactly as your caregiver instructs you. Finish the medication even if you feel better after you  have only taken some of the medication.   Drink enough water and fluids to keep your urine clear or pale yellow.   Avoid caffeine, tea, and carbonated beverages. They tend to irritate your bladder.   Empty your bladder often. Avoid holding urine for long periods of time.   Empty your bladder before and after sexual intercourse.   After a bowel movement, women should cleanse from front to back. Use each tissue only once.  SEEK MEDICAL CARE IF:    You have back pain.   You develop a fever.   Your symptoms do not begin to resolve within 3 days.  SEEK IMMEDIATE MEDICAL CARE IF:    You have severe back pain or lower abdominal pain.   You develop chills.   You have nausea or vomiting.   You have continued burning or discomfort with urination.  MAKE SURE YOU:    Understand these instructions.   Will watch your condition.   Will get help right away if you are not doing well or get worse.  Document Released: 11/12/2004 Document Revised: 08/04/2011 Document Reviewed: 03/13/2011  ExitCare Patient Information 2014 [employer].
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
## 2013-03-13 NOTE — Progress Notes (Signed)
Pre visit review using our clinic review tool, if applicable. No additional management support is needed unless otherwise documented below in the visit note. 

## 2013-03-15 LAB — URINE CULTURE
COLONY COUNT: NO GROWTH
ORGANISM ID, BACTERIA: NO GROWTH

## 2013-03-29 ENCOUNTER — Other Ambulatory Visit: Payer: Self-pay | Admitting: Family Medicine

## 2013-03-29 DIAGNOSIS — R300 Dysuria: Secondary | ICD-10-CM

## 2013-03-31 ENCOUNTER — Other Ambulatory Visit (INDEPENDENT_AMBULATORY_CARE_PROVIDER_SITE_OTHER): Payer: Medicare Other

## 2013-03-31 DIAGNOSIS — R300 Dysuria: Secondary | ICD-10-CM

## 2013-03-31 LAB — POCT URINALYSIS DIPSTICK
Bilirubin, UA: NEGATIVE
GLUCOSE UA: NEGATIVE
NITRITE UA: NEGATIVE
SPEC GRAV UA: 1.025
UROBILINOGEN UA: 0.2
pH, UA: 5.5

## 2013-03-31 NOTE — Addendum Note (Signed)
Addended by: Joyce Gross R on: 03/31/2013 05:01 PM   Modules accepted: Orders

## 2013-04-01 LAB — URINALYSIS, MICROSCOPIC ONLY

## 2013-04-12 ENCOUNTER — Telehealth: Payer: Self-pay | Admitting: Family Medicine

## 2013-04-12 MED ORDER — SULFAMETHOXAZOLE-TMP DS 800-160 MG PO TABS: 1.0000 | ORAL_TABLET | Freq: Two times a day (BID) | ORAL | Status: DC

## 2013-04-12 NOTE — Telephone Encounter (Signed)
Pt called stating he saw the lab results stating he has blood in his urine.  He would like to know if he needs to schedule an appointment.

## 2013-04-12 NOTE — Telephone Encounter (Signed)
I looked at the urine results and he appears to still have infection present. Call in Septra DS bid for 14 days. Then have him see me for an OV after the 2 weeks

## 2013-04-12 NOTE — Telephone Encounter (Signed)
I spoke with pt and sent script e-scribe. 

## 2013-05-02 DIAGNOSIS — D3130 Benign neoplasm of unspecified choroid: Secondary | ICD-10-CM | POA: Diagnosis not present

## 2013-05-02 DIAGNOSIS — Z961 Presence of intraocular lens: Secondary | ICD-10-CM | POA: Diagnosis not present

## 2013-05-03 ENCOUNTER — Telehealth: Payer: Self-pay | Admitting: Family Medicine

## 2013-05-03 NOTE — Telephone Encounter (Signed)
Can call pt to schedule for Friday 05/05/13?
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
## 2013-05-04 NOTE — Telephone Encounter (Signed)
Pt has been sch

## 2013-05-04 NOTE — Telephone Encounter (Signed)
lmom for pt to cb

## 2013-05-05 ENCOUNTER — Ambulatory Visit (INDEPENDENT_AMBULATORY_CARE_PROVIDER_SITE_OTHER): Payer: Medicare Other | Admitting: Family Medicine

## 2013-05-05 ENCOUNTER — Encounter: Payer: Self-pay | Admitting: Family Medicine

## 2013-05-05 VITALS — BP 110/74 | Temp 98.2°F | Wt 228.0 lb

## 2013-05-05 DIAGNOSIS — F329 Major depressive disorder, single episode, unspecified: Secondary | ICD-10-CM

## 2013-05-05 DIAGNOSIS — R413 Other amnesia: Secondary | ICD-10-CM | POA: Diagnosis not present

## 2013-05-05 DIAGNOSIS — F3289 Other specified depressive episodes: Secondary | ICD-10-CM

## 2013-05-05 LAB — CBC WITH DIFFERENTIAL/PLATELET
BASOS ABS: 0 10*3/uL (ref 0.0–0.1)
Basophils Relative: 0.4 % (ref 0.0–3.0)
Eosinophils Absolute: 0 10*3/uL (ref 0.0–0.7)
Eosinophils Relative: 1 % (ref 0.0–5.0)
HEMATOCRIT: 42.2 % (ref 39.0–52.0)
Hemoglobin: 14.2 g/dL (ref 13.0–17.0)
LYMPHS ABS: 1.3 10*3/uL (ref 0.7–4.0)
Lymphocytes Relative: 25.7 % (ref 12.0–46.0)
MCHC: 33.8 g/dL (ref 30.0–36.0)
MCV: 95.1 fl (ref 78.0–100.0)
MONO ABS: 0.4 10*3/uL (ref 0.1–1.0)
MONOS PCT: 7.5 % (ref 3.0–12.0)
NEUTROS PCT: 65.4 % (ref 43.0–77.0)
Neutro Abs: 3.3 10*3/uL (ref 1.4–7.7)
PLATELETS: 130 10*3/uL — AB (ref 150.0–400.0)
RBC: 4.43 Mil/uL (ref 4.22–5.81)
RDW: 13.5 % (ref 11.5–14.6)
WBC: 5.1 10*3/uL (ref 4.5–10.5)

## 2013-05-05 LAB — HEPATIC FUNCTION PANEL
ALT: 20 U/L (ref 0–53)
AST: 18 U/L (ref 0–37)
Albumin: 4 g/dL (ref 3.5–5.2)
Alkaline Phosphatase: 55 U/L (ref 39–117)
BILIRUBIN TOTAL: 0.6 mg/dL (ref 0.3–1.2)
Bilirubin, Direct: 0 mg/dL (ref 0.0–0.3)
TOTAL PROTEIN: 6.6 g/dL (ref 6.0–8.3)

## 2013-05-05 LAB — BASIC METABOLIC PANEL
BUN: 30 mg/dL — AB (ref 6–23)
CHLORIDE: 100 meq/L (ref 96–112)
CO2: 31 meq/L (ref 19–32)
CREATININE: 1.3 mg/dL (ref 0.4–1.5)
Calcium: 9.5 mg/dL (ref 8.4–10.5)
GFR: 56.6 mL/min — ABNORMAL LOW (ref >60.00)
GLUCOSE: 100 mg/dL — AB (ref 70–99)
POTASSIUM: 3.7 meq/L (ref 3.5–5.1)
Sodium: 139 mEq/L (ref 135–145)

## 2013-05-05 LAB — VITAMIN B12: Vitamin B-12: 244 pg/mL (ref 211–911)

## 2013-05-05 LAB — TSH: TSH: 0.65 u[IU]/mL (ref 0.35–5.50)

## 2013-05-05 MED ORDER — LORAZEPAM 1 MG PO TABS: 1.0000 mg | ORAL_TABLET | Freq: Three times a day (TID) | ORAL | Status: DC | PRN

## 2013-05-05 MED ORDER — GABAPENTIN 300 MG PO CAPS: 300.0000 mg | ORAL_CAPSULE | Freq: Two times a day (BID) | ORAL | Status: DC

## 2013-05-05 MED ORDER — BUPROPION HCL ER (XL) 150 MG PO TB24: 150.0000 mg | ORAL_TABLET | Freq: Every day | ORAL | Status: DC

## 2013-05-05 NOTE — Progress Notes (Signed)
Pre visit review using our clinic review tool, if applicable. No additional management support is needed unless otherwise documented below in the visit note. 
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
## 2013-05-15 ENCOUNTER — Telehealth: Payer: Self-pay | Admitting: Family Medicine

## 2013-05-15 NOTE — Telephone Encounter (Signed)
Pt called stating he received a call for an appointment with neurology for an office in [place], not [facility].  He would like to see the doctor in [facility] for neurology.

## 2013-05-17 NOTE — Telephone Encounter (Signed)
Spoke with [facility], patient's appt has been rescheduled to 5/8 at 12:45pm.  That is the first available appt in the [facility] location.  Left message for pt to return call.

## 2013-05-18 ENCOUNTER — Telehealth: Payer: Self-pay | Admitting: Family Medicine

## 2013-05-18 NOTE — Telephone Encounter (Signed)
Dr Marquette Saa is no longer with [facility]. Pt has appt with Dr Nilda Riggs on 07/07/13 at 8am. Pt would like to know if there is another neurology you can refer him too.

## 2013-05-29 ENCOUNTER — Ambulatory Visit (INDEPENDENT_AMBULATORY_CARE_PROVIDER_SITE_OTHER): Payer: Medicare Other | Admitting: Family Medicine

## 2013-05-29 ENCOUNTER — Encounter: Payer: Self-pay | Admitting: Family Medicine

## 2013-05-29 VITALS — BP 112/72 | HR 85 | Temp 97.6°F | Ht 74.5 in | Wt 226.0 lb

## 2013-05-29 DIAGNOSIS — I10 Essential (primary) hypertension: Secondary | ICD-10-CM | POA: Diagnosis not present

## 2013-05-29 DIAGNOSIS — G589 Mononeuropathy, unspecified: Secondary | ICD-10-CM | POA: Diagnosis not present

## 2013-05-29 DIAGNOSIS — F3289 Other specified depressive episodes: Secondary | ICD-10-CM | POA: Diagnosis not present

## 2013-05-29 DIAGNOSIS — G4700 Insomnia, unspecified: Secondary | ICD-10-CM

## 2013-05-29 DIAGNOSIS — F329 Major depressive disorder, single episode, unspecified: Secondary | ICD-10-CM | POA: Diagnosis not present

## 2013-05-29 MED ORDER — TAMSULOSIN HCL 0.4 MG PO CAPS: 0.4000 mg | ORAL_CAPSULE | Freq: Every day | ORAL | Status: DC

## 2013-05-29 NOTE — Progress Notes (Signed)
Pre visit review using our clinic review tool, if applicable. No additional management support is needed unless otherwise documented below in the visit note. 

## 2013-05-29 NOTE — Progress Notes (Signed)
   Subjective:    Patient ID: Boston Service, male    DOB: 12/14/1945, 68 y.o.   MRN: [id number]  HPI Here to follow up. He has stopped Zoloft and started on Wellbutrin and he thinks his moods are improved. He still has tremors and memory issues, and he will see Dr. Nilda Riggs at [facility] on 07-07-13.    Review of Systems  Constitutional: Negative.   Respiratory: Negative.   Cardiovascular: Negative.   Neurological: Negative.        Objective:   Physical Exam  Constitutional: He is oriented to person, place, and time. He appears well-developed and well-nourished.  Cardiovascular: Normal rate, regular rhythm, normal heart sounds and intact distal pulses.   Pulmonary/Chest: Effort normal and breath sounds normal.  Neurological: He is alert and oriented to person, place, and time.  Psychiatric: He has a normal mood and affect. His behavior is normal. Thought content normal.          Assessment & Plan:  He seems to be feeling better. We will see what the neurologist says.

## 2013-05-30 ENCOUNTER — Telehealth: Payer: Self-pay | Admitting: Family Medicine

## 2013-05-30 NOTE — Telephone Encounter (Signed)
Relevant patient education assigned to patient using Emmi. ° °

## 2013-05-30 NOTE — Telephone Encounter (Signed)
We spoke about this today. He will wait to see Dr. Blima Singer

## 2013-06-14 DIAGNOSIS — M545 Low back pain, unspecified: Secondary | ICD-10-CM | POA: Diagnosis not present

## 2013-06-14 DIAGNOSIS — M47817 Spondylosis without myelopathy or radiculopathy, lumbosacral region: Secondary | ICD-10-CM | POA: Diagnosis not present

## 2013-07-07 ENCOUNTER — Telehealth: Payer: Self-pay | Admitting: Family Medicine

## 2013-07-07 DIAGNOSIS — R413 Other amnesia: Secondary | ICD-10-CM

## 2013-07-07 NOTE — Telephone Encounter (Signed)
I spoke with pt  

## 2013-07-07 NOTE — Telephone Encounter (Signed)
Pt had an appt today for the neurologist however they over booked him, and stated it would be another 2 months before getting back in. Pt wants to know if dr. Sarajane Jews can refer him someone where else because he needs to be seen before July.

## 2013-07-07 NOTE — Telephone Encounter (Signed)
Referral was done to [facility]

## 2013-07-17 ENCOUNTER — Other Ambulatory Visit: Payer: Self-pay | Admitting: Family Medicine

## 2013-07-18 ENCOUNTER — Other Ambulatory Visit: Payer: Self-pay | Admitting: Family Medicine

## 2013-07-19 ENCOUNTER — Ambulatory Visit (INDEPENDENT_AMBULATORY_CARE_PROVIDER_SITE_OTHER): Payer: Medicare Other | Admitting: Family Medicine

## 2013-07-19 ENCOUNTER — Encounter: Payer: Self-pay | Admitting: Family Medicine

## 2013-07-19 VITALS — BP 136/93 | HR 85 | Temp 98.2°F | Ht 74.5 in | Wt 232.0 lb

## 2013-07-19 DIAGNOSIS — R259 Unspecified abnormal involuntary movements: Secondary | ICD-10-CM

## 2013-07-19 DIAGNOSIS — R251 Tremor, unspecified: Secondary | ICD-10-CM

## 2013-07-19 DIAGNOSIS — G4733 Obstructive sleep apnea (adult) (pediatric): Secondary | ICD-10-CM | POA: Diagnosis not present

## 2013-07-19 MED ORDER — TEMAZEPAM 30 MG PO CAPS: ORAL_CAPSULE | ORAL | Status: DC

## 2013-07-19 NOTE — Progress Notes (Signed)
Pre visit review using our clinic review tool, if applicable. No additional management support is needed unless otherwise documented below in the visit note. 

## 2013-07-19 NOTE — Progress Notes (Signed)
   Subjective:    Patient ID: Brandon Schroeder, male    DOB: [birth year], 68 y.o.   MRN: [id number]  HPI Here to discuss his sleep problems and his tremors. He had been referred to [facility] but they have given him the runaround about the appt and he has changed his mind. He now wants to see a local neurologist. Also he still has trouble sleeping and his wife says he snores and is restless all night.    Review of Systems  Constitutional: Negative.   Respiratory: Negative.   Cardiovascular: Negative.   Neurological: Positive for tremors.  Psychiatric/Behavioral: Positive for sleep disturbance.       Objective:   Physical Exam  Constitutional: He is oriented to person, place, and time. He appears well-developed and well-nourished.  Neurological: He is alert and oriented to person, place, and time. He has normal reflexes. No cranial nerve deficit. He exhibits normal muscle tone. Coordination normal.  Psychiatric: He has a normal mood and affect. His behavior is normal. Thought content normal.          Assessment & Plan:  Refer to [facility] and for sleep apnea.

## 2013-08-01 DIAGNOSIS — M545 Low back pain, unspecified: Secondary | ICD-10-CM | POA: Diagnosis not present

## 2013-08-01 DIAGNOSIS — M47817 Spondylosis without myelopathy or radiculopathy, lumbosacral region: Secondary | ICD-10-CM | POA: Diagnosis not present

## 2013-08-02 ENCOUNTER — Other Ambulatory Visit: Payer: Self-pay | Admitting: Family Medicine

## 2013-08-09 DIAGNOSIS — M545 Low back pain, unspecified: Secondary | ICD-10-CM | POA: Diagnosis not present

## 2013-08-09 DIAGNOSIS — M47817 Spondylosis without myelopathy or radiculopathy, lumbosacral region: Secondary | ICD-10-CM | POA: Diagnosis not present

## 2013-08-14 ENCOUNTER — Encounter: Payer: Self-pay | Admitting: Pulmonary Disease

## 2013-08-14 ENCOUNTER — Ambulatory Visit (INDEPENDENT_AMBULATORY_CARE_PROVIDER_SITE_OTHER): Payer: Medicare Other | Admitting: Pulmonary Disease

## 2013-08-14 VITALS — BP 124/82 | HR 89 | Temp 97.5°F | Ht 74.0 in | Wt 226.6 lb

## 2013-08-14 DIAGNOSIS — G4733 Obstructive sleep apnea (adult) (pediatric): Secondary | ICD-10-CM

## 2013-08-14 DIAGNOSIS — G4731 Primary central sleep apnea: Secondary | ICD-10-CM | POA: Insufficient documentation

## 2013-08-14 NOTE — Patient Instructions (Signed)
Will arrange for home sleep testing, and will call once results are available Work on weight loss.  

## 2013-08-14 NOTE — Assessment & Plan Note (Signed)
The patient is having issues with snoring and an abnormal breathing pattern during sleep that may be related to obstructive sleep apnea or chest increased upper airway resistance. He is having nonrestorative sleep but only mild sleepiness during the day with inactivity. At this point, he will need a sleep study for evaluation, and I think he is an excellent candidate for home sleep testing. The patient is agreeable to this approach.

## 2013-08-14 NOTE — Progress Notes (Signed)
Subjective:    Patient ID: Brandon Schroeder, male    DOB: 02-15-1946, 68 y.o.   MRN: [id number]  HPI The patient is a 68 year old male who I've been asked to see for possible obstructive sleep apnea. He apparently had a sleep study in [place] in the 1990s, and was told that he had the restless leg syndrome but not obstructive sleep apnea. He underwent nasal septal reconstruction and UP3, and did have significant improvement in his sleep for a while. Most recently, the patient has been noted to have increasing snoring, as well as an abnormal breathing pattern during sleep. He has frequent awakenings at night, and is not rested in the mornings upon arising. He has intermittent inappropriate daytime sleepiness, but complains of feeling tired. He has sleepiness in the evenings if watching something on television that is not interesting. He has no sleepiness with driving. He has been diagnosed as having the restless leg syndrome, and took medication for a while. He feels that he no longer has an issue with this, but does have a referral neuropathy that can bother him during sleep. His wife has not been complaining of kicking during the night. The patient states that his weight is up 25 pounds over the last 2 years, and her Epworth score today is 9.   Sleep Questionnaire What time do you typically go to bed?( Between what hours) 9:30-10pm 9:30-10pm at 1037 on 08/14/13 by Lilli Few, CMA How long does it take you to fall asleep? 9minutes-1 hour 70minutes-1 hour at 1037 on 08/14/13 by Lilli Few, CMA How many times during the night do you wake up? 3 3 at 1037 on 08/14/13 by Lilli Few, CMA What time do you get out of bed to start your day? 0730 0730 at 1037 on 08/14/13 by Lilli Few, CMA Do you drive or operate heavy machinery in your occupation? No No at 1037 on 08/14/13 by Lilli Few, CMA How much has your weight changed (up or down) over the past two  years? (In pounds) 25 lb (11.34 kg) 25 lb (11.34 kg) at 1037 on 08/14/13 by Lilli Few, CMA Have you ever had a sleep study before? Yes Yes at 1037 on 08/14/13 by Lilli Few, CMA If yes, [place] 1037 on 08/14/13 by Lilli Few, CMA If yes, date of study? 0102'V 1990's at 1037 on 08/14/13 by Lilli Few, CMA Do you currently use CPAP? No No at 1037 on 08/14/13 by Lilli Few, CMA Do you wear oxygen at any time? No No at 1037 on 08/14/13 by Lilli Few, CMA   Review of Systems  Constitutional: Negative for fever and unexpected weight change.  HENT: Negative for congestion, dental problem, ear pain, nosebleeds, postnasal drip, rhinorrhea, sinus pressure, sneezing, sore throat and trouble swallowing.   Eyes: Negative for redness and itching.  Respiratory: Positive for cough and shortness of breath. Negative for chest tightness and wheezing.   Cardiovascular: Negative for palpitations and leg swelling.  Gastrointestinal: Negative for nausea and vomiting.  Genitourinary: Negative for dysuria.  Musculoskeletal: Negative for joint swelling.  Skin: Negative for rash.  Neurological: Negative for headaches.  Hematological: Does not bruise/bleed easily.  Psychiatric/Behavioral: Negative for dysphoric mood. The patient is not nervous/anxious.        Objective:   Physical Exam Constitutional:  Well developed, no acute distress  HENT:  Nares patent without discharge, mild septal deviation to  the left with narrowing  Oropharynx without exudate, palate and uvula are trimmed.  Eyes:  Perrla, eomi, no scleral icterus  Neck:  No JVD, no TMG  Cardiovascular:  Normal rate, regular rhythm, no rubs or gallops.  No murmurs        Intact distal pulses  Pulmonary :  Normal breath sounds, no stridor or respiratory distress   No rales, rhonchi, or wheezing  Abdominal:  Soft, nondistended, bowel sounds  present.  No tenderness noted.   Musculoskeletal:  No lower extremity edema noted.  Lymph Nodes:  No cervical lymphadenopathy noted  Skin:  No cyanosis noted  Neurologic:  Alert, appropriate, moves all 4 extremities without obvious deficit.         Assessment & Plan:

## 2013-08-31 ENCOUNTER — Encounter: Payer: Self-pay | Admitting: Family Medicine

## 2013-08-31 ENCOUNTER — Ambulatory Visit (INDEPENDENT_AMBULATORY_CARE_PROVIDER_SITE_OTHER): Payer: Medicare Other | Admitting: Family Medicine

## 2013-08-31 VITALS — BP 100/64 | HR 79 | Temp 98.1°F | Ht 74.0 in | Wt 228.0 lb

## 2013-08-31 DIAGNOSIS — N401 Enlarged prostate with lower urinary tract symptoms: Secondary | ICD-10-CM

## 2013-08-31 DIAGNOSIS — K219 Gastro-esophageal reflux disease without esophagitis: Secondary | ICD-10-CM

## 2013-08-31 DIAGNOSIS — F3289 Other specified depressive episodes: Secondary | ICD-10-CM

## 2013-08-31 DIAGNOSIS — N139 Obstructive and reflux uropathy, unspecified: Secondary | ICD-10-CM | POA: Diagnosis not present

## 2013-08-31 DIAGNOSIS — N138 Other obstructive and reflux uropathy: Secondary | ICD-10-CM

## 2013-08-31 DIAGNOSIS — Z87898 Personal history of other specified conditions: Secondary | ICD-10-CM

## 2013-08-31 DIAGNOSIS — I10 Essential (primary) hypertension: Secondary | ICD-10-CM | POA: Diagnosis not present

## 2013-08-31 DIAGNOSIS — Z23 Encounter for immunization: Secondary | ICD-10-CM

## 2013-08-31 DIAGNOSIS — G589 Mononeuropathy, unspecified: Secondary | ICD-10-CM

## 2013-08-31 DIAGNOSIS — R259 Unspecified abnormal involuntary movements: Secondary | ICD-10-CM

## 2013-08-31 DIAGNOSIS — G4700 Insomnia, unspecified: Secondary | ICD-10-CM

## 2013-08-31 DIAGNOSIS — K589 Irritable bowel syndrome without diarrhea: Secondary | ICD-10-CM

## 2013-08-31 DIAGNOSIS — G2581 Restless legs syndrome: Secondary | ICD-10-CM

## 2013-08-31 DIAGNOSIS — F329 Major depressive disorder, single episode, unspecified: Secondary | ICD-10-CM

## 2013-08-31 DIAGNOSIS — R251 Tremor, unspecified: Secondary | ICD-10-CM

## 2013-08-31 LAB — CBC WITH DIFFERENTIAL/PLATELET
BASOS ABS: 0 10*3/uL (ref 0.0–0.1)
Basophils Relative: 0.4 % (ref 0.0–3.0)
EOS PCT: 2.4 % (ref 0.0–5.0)
Eosinophils Absolute: 0.1 10*3/uL (ref 0.0–0.7)
HCT: 42.3 % (ref 39.0–52.0)
Hemoglobin: 14.2 g/dL (ref 13.0–17.0)
LYMPHS PCT: 29.1 % (ref 12.0–46.0)
Lymphs Abs: 1.5 10*3/uL (ref 0.7–4.0)
MCHC: 33.7 g/dL (ref 30.0–36.0)
MCV: 97.3 fl (ref 78.0–100.0)
Monocytes Absolute: 0.5 10*3/uL (ref 0.1–1.0)
Monocytes Relative: 10.1 % (ref 3.0–12.0)
NEUTROS PCT: 58 % (ref 43.0–77.0)
Neutro Abs: 2.9 10*3/uL (ref 1.4–7.7)
Platelets: 123 10*3/uL — ABNORMAL LOW (ref 150.0–400.0)
RBC: 4.35 Mil/uL (ref 4.22–5.81)
RDW: 13.5 % (ref 11.5–15.5)
WBC: 5 10*3/uL (ref 4.0–10.5)

## 2013-08-31 LAB — LIPID PANEL
CHOL/HDL RATIO: 3
CHOLESTEROL: 181 mg/dL (ref 0–200)
HDL: 58.4 mg/dL (ref >39.00)
LDL Cholesterol: 106 mg/dL — ABNORMAL HIGH (ref 0–99)
NonHDL: 122.6
TRIGLYCERIDES: 81 mg/dL (ref 0.0–149.0)
VLDL: 16.2 mg/dL (ref 0.0–40.0)

## 2013-08-31 LAB — HEPATIC FUNCTION PANEL
ALBUMIN: 4 g/dL (ref 3.5–5.2)
ALK PHOS: 64 U/L (ref 39–117)
ALT: 25 U/L (ref 0–53)
AST: 20 U/L (ref 0–37)
Bilirubin, Direct: 0.2 mg/dL (ref 0.0–0.3)
TOTAL PROTEIN: 6.6 g/dL (ref 6.0–8.3)
Total Bilirubin: 0.9 mg/dL (ref 0.2–1.2)

## 2013-08-31 LAB — POCT URINALYSIS DIPSTICK
Bilirubin, UA: NEGATIVE
Glucose, UA: NEGATIVE
Ketones, UA: NEGATIVE
NITRITE UA: NEGATIVE
PH UA: 6
Spec Grav, UA: 1.025
Urobilinogen, UA: 1

## 2013-08-31 LAB — TSH: TSH: 1.84 u[IU]/mL (ref 0.35–4.50)

## 2013-08-31 LAB — BASIC METABOLIC PANEL
BUN: 25 mg/dL — AB (ref 6–23)
CHLORIDE: 99 meq/L (ref 96–112)
CO2: 35 mEq/L — ABNORMAL HIGH (ref 19–32)
Calcium: 9.3 mg/dL (ref 8.4–10.5)
Creatinine, Ser: 1.6 mg/dL — ABNORMAL HIGH (ref 0.4–1.5)
GFR: 47.8 mL/min — ABNORMAL LOW (ref >60.00)
Glucose, Bld: 105 mg/dL — ABNORMAL HIGH (ref 70–99)
Potassium: 4.1 mEq/L (ref 3.5–5.1)
Sodium: 138 mEq/L (ref 135–145)

## 2013-08-31 LAB — PSA: PSA: 4.25 ng/mL — AB (ref 0.10–4.00)

## 2013-08-31 MED ORDER — AMLODIPINE BESY-BENAZEPRIL HCL 5-40 MG PO CAPS: ORAL_CAPSULE | ORAL | Status: DC

## 2013-08-31 MED ORDER — GABAPENTIN 300 MG PO CAPS: 300.0000 mg | ORAL_CAPSULE | Freq: Four times a day (QID) | ORAL | Status: DC

## 2013-08-31 MED ORDER — MELOXICAM 15 MG PO TABS: ORAL_TABLET | ORAL | Status: DC

## 2013-08-31 MED ORDER — HYDROCHLOROTHIAZIDE 12.5 MG PO CAPS: 12.5000 mg | ORAL_CAPSULE | Freq: Every day | ORAL | Status: DC

## 2013-08-31 MED ORDER — BUPROPION HCL ER (XL) 150 MG PO TB24: ORAL_TABLET | ORAL | Status: DC

## 2013-08-31 NOTE — Progress Notes (Signed)
Pre visit review using our clinic review tool, if applicable. No additional management support is needed unless otherwise documented below in the visit note. 

## 2013-08-31 NOTE — Progress Notes (Signed)
   Subjective:    Patient ID: Brandon Schroeder, male    DOB: [birth year], 68 y.o.   MRN: [id number]  HPI 68 yr old male for a cpx. He is doing well in general except for his sleep issues and his tremors. He has an appt with [facility] for November but he would like to try a local neurologist first. He saw [facility] but now wants a nother local opinion. He saw Dr. Gwenette Greet for possible sleep apnea and they are setting up a home sleep study. He wants to increase the Gabapentin to 300 mg QID. Last month he had a procedure per Dr. Ernestina Patches to ablate some lumbar spinal nerves.    Review of Systems  Constitutional: Negative.   HENT: Negative.   Eyes: Negative.   Respiratory: Negative.   Cardiovascular: Negative.   Gastrointestinal: Negative.   Genitourinary: Negative.   Musculoskeletal: Positive for back pain. Negative for arthralgias, gait problem, joint swelling, myalgias, neck pain and neck stiffness.  Skin: Negative.   Neurological: Positive for tremors. Negative for dizziness, seizures, syncope, facial asymmetry, speech difficulty, weakness, light-headedness, numbness and headaches.  Psychiatric/Behavioral: Negative.        Objective:   Physical Exam  Constitutional: He is oriented to person, place, and time. He appears well-developed and well-nourished. No distress.  HENT:  Head: Normocephalic and atraumatic.  Right Ear: External ear normal.  Left Ear: External ear normal.  Nose: Nose normal.  Mouth/Throat: Oropharynx is clear and moist. No oropharyngeal exudate.  Eyes: Conjunctivae and EOM are normal. Pupils are equal, round, and reactive to light. Right eye exhibits no discharge. Left eye exhibits no discharge. No scleral icterus.  Neck: Neck supple. No JVD present. No tracheal deviation present. No thyromegaly present.  Cardiovascular: Normal rate, regular rhythm, normal heart sounds and intact distal pulses.  Exam reveals no gallop and no friction rub.   No murmur  heard. EKG normal   Pulmonary/Chest: Effort normal and breath sounds normal. No respiratory distress. He has no wheezes. He has no rales. He exhibits no tenderness.  Abdominal: Soft. Bowel sounds are normal. He exhibits no distension and no mass. There is no tenderness. There is no rebound and no guarding.  Genitourinary: Rectum normal, prostate normal and penis normal. Guaiac negative stool. No penile tenderness.  Musculoskeletal: Normal range of motion. He exhibits no edema and no tenderness.  Lymphadenopathy:    He has no cervical adenopathy.  Neurological: He is alert and oriented to person, place, and time. He has normal reflexes. No cranial nerve deficit. He exhibits normal muscle tone. Coordination normal.  Skin: Skin is warm and dry. No rash noted. He is not diaphoretic. No erythema. No pallor.  Psychiatric: He has a normal mood and affect. His behavior is normal. Judgment and thought content normal.          Assessment & Plan:  Well exam. Get fasting labs. Refer to [facility]. Increase Gabapentin to 300 mg qid.

## 2013-08-31 NOTE — Addendum Note (Signed)
Addended by: Aggie Hacker A on: 08/31/2013 10:53 AM   Modules accepted: Orders

## 2013-09-04 MED ORDER — SULFAMETHOXAZOLE-TMP DS 800-160 MG PO TABS: 1.0000 | ORAL_TABLET | Freq: Two times a day (BID) | ORAL | Status: DC

## 2013-09-04 NOTE — Addendum Note (Signed)
Addended by: Aggie Hacker A on: 09/04/2013 09:28 AM   Modules accepted: Orders

## 2013-09-07 ENCOUNTER — Encounter: Payer: Self-pay | Admitting: Neurology

## 2013-09-07 ENCOUNTER — Ambulatory Visit (INDEPENDENT_AMBULATORY_CARE_PROVIDER_SITE_OTHER): Payer: Medicare Other | Admitting: Neurology

## 2013-09-07 VITALS — BP 103/69 | HR 84 | Ht 74.0 in | Wt 233.0 lb

## 2013-09-07 DIAGNOSIS — R4189 Other symptoms and signs involving cognitive functions and awareness: Secondary | ICD-10-CM

## 2013-09-07 DIAGNOSIS — F09 Unspecified mental disorder due to known physiological condition: Secondary | ICD-10-CM
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
## 2013-09-07 NOTE — Patient Instructions (Addendum)
Overall you are doing fairly well but I do want to suggest a few things today:   Remember to drink plenty of fluid, eat healthy meals and do not skip any meals. Try to eat protein with a every meal and eat a healthy snack such as fruit or nuts in between meals. Try to keep a regular sleep-wake schedule and try to exercise daily, particularly in the form of walking, 20-30 minutes a day, if you can.   As far as diagnostic testing:  1)I would like you to have a MRI of the brain. You will be called to schedule this 2)I would like you to have formal cognitive testing done with neuro-psychology. You will be called to schedule this  I would like to see you back once the testing is completed, sooner if we need to. Please call us with any interim questions, concerns, problems, updates or refill requests.   My clinical assistant and will answer any of your questions and relay your messages to me and also relay most of my messages to you.   Our phone number is [phone]. We also have an after hours call service for urgent matters and there is a physician on-call for urgent questions. For any emergencies you know to call 911 or go to the nearest emergency room

## 2013-09-11 ENCOUNTER — Ambulatory Visit
Admission: RE | Admit: 2013-09-11 | Discharge: 2013-09-11 | Disposition: A | Discharge status: Home / Self Care | Payer: Medicare Other | Source: Ambulatory Visit | Attending: Neurology | Admitting: Neurology

## 2013-09-11 ENCOUNTER — Telehealth: Payer: Self-pay | Admitting: Family Medicine

## 2013-09-11 DIAGNOSIS — F09 Unspecified mental disorder due to known physiological condition: Secondary | ICD-10-CM | POA: Diagnosis not present

## 2013-09-11 DIAGNOSIS — R4189 Other symptoms and signs involving cognitive functions and awareness: Secondary | ICD-10-CM

## 2013-09-11 DIAGNOSIS — R413 Other amnesia: Secondary | ICD-10-CM | POA: Diagnosis not present

## 2013-09-11 NOTE — Telephone Encounter (Signed)
Pt is requesting to speak with you regarding his medications that he is taking. Pt states he is dizzy and wants to talk about one of his medicine.

## 2013-09-12 MED ORDER — TERAZOSIN HCL 2 MG PO CAPS: 2.0000 mg | ORAL_CAPSULE | Freq: Every day | ORAL | Status: DC

## 2013-09-12 NOTE — Telephone Encounter (Signed)
I agree the dizziness is a side effect. Stop the Flomax and try Hytrin 2 mg daily. Call in one year supply

## 2013-09-12 NOTE — Telephone Encounter (Signed)
I spoke with pt  

## 2013-09-12 NOTE — Telephone Encounter (Signed)
I spoke with Brandon Schroeder and he started back on Flomax 0.4 mg Friday 09/08/13 and started to feel dizzy. He took same medication Saturday 09/09/13 and again became dizzy and fell in the house, no injury reported. He has not taken this medication since then, he only started back on it because he was going to the bathroom a lot. Is there a replacement for the Flomax?

## 2013-09-12 NOTE — Telephone Encounter (Signed)
I left a voice message for pt to return my call, also sent script e-scribe to [facility].

## 2013-09-13 ENCOUNTER — Telehealth: Payer: Self-pay | Admitting: Neurology

## 2013-09-13 NOTE — Telephone Encounter (Signed)
Patient calling to state that Dr. Janann Colonel wants to see him after he sees the neuropsychologist, but that appointment won't be until 12/21/13 and patient wanted [place] to be aware. If questions, please call patient and advise.

## 2013-09-14 NOTE — Telephone Encounter (Signed)
That is fine. If anything changes we can fit him in prior to that for a follow up.

## 2013-09-18 DIAGNOSIS — G4710 Hypersomnia, unspecified: Secondary | ICD-10-CM | POA: Diagnosis not present

## 2013-09-18 DIAGNOSIS — G4730 Sleep apnea, unspecified: Secondary | ICD-10-CM

## 2013-09-21 ENCOUNTER — Telehealth: Payer: Self-pay | Admitting: Pulmonary Disease

## 2013-09-21 NOTE — Telephone Encounter (Signed)
Spoke with the pt He is requesting home sleep study results  I advised [place] out of the office this afternoon, but will send him the msg letting him know he is req results  Please advise, thanks!

## 2013-09-22 ENCOUNTER — Telehealth: Payer: Self-pay | Admitting: Pulmonary Disease

## 2013-09-22 DIAGNOSIS — G4730 Sleep apnea, unspecified: Secondary | ICD-10-CM

## 2013-09-22 DIAGNOSIS — G4710 Hypersomnia, unspecified: Secondary | ICD-10-CM

## 2013-09-22 NOTE — Telephone Encounter (Signed)
[email or web address].Jamaury Gumz, CMA  

## 2013-09-22 NOTE — Telephone Encounter (Signed)
Pt needs ov to review sleep study 

## 2013-09-25 ENCOUNTER — Telehealth: Payer: Self-pay | Admitting: Family Medicine

## 2013-09-25 ENCOUNTER — Other Ambulatory Visit (INDEPENDENT_AMBULATORY_CARE_PROVIDER_SITE_OTHER): Payer: Medicare Other

## 2013-09-25 ENCOUNTER — Encounter: Payer: Self-pay | Admitting: Pulmonary Disease

## 2013-09-25 DIAGNOSIS — R300 Dysuria: Secondary | ICD-10-CM | POA: Diagnosis not present

## 2013-09-25 LAB — POCT URINALYSIS DIPSTICK
Bilirubin, UA: NEGATIVE
Glucose, UA: NEGATIVE
KETONES UA: NEGATIVE
Nitrite, UA: NEGATIVE
SPEC GRAV UA: 1.01
UROBILINOGEN UA: 0.2
pH, UA: 5.5

## 2013-09-25 LAB — URINALYSIS, MICROSCOPIC ONLY

## 2013-09-25 NOTE — Telephone Encounter (Signed)
Patient came into the office today for a BP check. Reading was 108/70.  Pt released. Will send to PCP for further review.

## 2013-09-25 NOTE — Telephone Encounter (Signed)
noted 

## 2013-09-25 NOTE — Telephone Encounter (Signed)
appt set. Jennifer Castillo, CMA  

## 2013-09-26 ENCOUNTER — Encounter: Payer: Self-pay | Admitting: Family Medicine

## 2013-09-26 DIAGNOSIS — R300 Dysuria: Secondary | ICD-10-CM | POA: Diagnosis not present

## 2013-09-26 MED ORDER — DOXYCYCLINE HYCLATE 100 MG PO TABS: 100.0000 mg | ORAL_TABLET | Freq: Two times a day (BID) | ORAL | Status: DC

## 2013-09-26 NOTE — Addendum Note (Signed)
Addended by: Aggie Hacker A on: 09/26/2013 11:10 AM   Modules accepted: Orders

## 2013-09-26 NOTE — Addendum Note (Signed)
Addended by: Joyce Gross R on: 09/26/2013 09:16 AM   Modules accepted: Orders

## 2013-09-27 ENCOUNTER — Encounter: Payer: Self-pay | Admitting: Family Medicine

## 2013-09-27 ENCOUNTER — Encounter: Payer: Self-pay | Admitting: Pulmonary Disease

## 2013-09-27 ENCOUNTER — Ambulatory Visit (INDEPENDENT_AMBULATORY_CARE_PROVIDER_SITE_OTHER): Payer: Medicare Other | Admitting: Pulmonary Disease

## 2013-09-27 VITALS — BP 116/70 | HR 90 | Temp 98.1°F | Ht 74.5 in | Wt 236.8 lb

## 2013-09-27 DIAGNOSIS — L989 Disorder of the skin and subcutaneous tissue, unspecified: Secondary | ICD-10-CM

## 2013-09-27 DIAGNOSIS — G4733 Obstructive sleep apnea (adult) (pediatric): Secondary | ICD-10-CM | POA: Diagnosis not present

## 2013-09-27 DIAGNOSIS — R0602 Shortness of breath: Secondary | ICD-10-CM

## 2013-09-27 LAB — URINE CULTURE
Colony Count: NO GROWTH
ORGANISM ID, BACTERIA: NO GROWTH

## 2013-09-27 NOTE — Telephone Encounter (Signed)
The culture usually takes about 3 days to come back. I suggest we simply wait to get this before we call in any more antibiotics

## 2013-09-27 NOTE — Telephone Encounter (Signed)
Both referrals were done  

## 2013-09-27 NOTE — Telephone Encounter (Signed)
Pt being seen today by [facility] to review sleeps study results.  Nothing further needed.

## 2013-09-27 NOTE — Assessment & Plan Note (Signed)
The patient has been diagnosed with mild obstructive sleep apnea, and I have outlined a conservative path with weight loss alone versus more aggressive treatment with either CPAP or a dental appliance. The patient feels that he is symptomatic enough that he would like to treat this more aggressively, and we will therefore try CPAP while working on weight loss. I will set the patient up on cpap at a moderate pressure level to allow for desensitization, and will troubleshoot the device over the next 4-6weeks if needed.  The pt is to call me if having issues with tolerance.  Will then optimize the pressure once patient is able to wear cpap on a consistent basis.

## 2013-09-27 NOTE — Progress Notes (Signed)
   Subjective:    Patient ID: Brandon Schroeder, male    DOB: [birth year], 68 y.o.   MRN: [id number]  HPI The patient comes in today for followup of his recent home sleep test. He was found to have mild obstructive sleep apnea with an AHI of 16 events per hour. I have reviewed the study with him in detail, and answered all of his questions appeared   Review of Systems  Constitutional: Negative for fever and unexpected weight change.  HENT: Negative for congestion, dental problem, ear pain, nosebleeds, postnasal drip, rhinorrhea, sinus pressure, sneezing, sore throat and trouble swallowing.   Eyes: Negative for redness and itching.  Respiratory: Negative for cough, chest tightness, shortness of breath and wheezing.   Cardiovascular: Negative for palpitations and leg swelling.  Gastrointestinal: Negative for nausea and vomiting.  Genitourinary: Negative for dysuria.  Musculoskeletal: Negative for joint swelling.  Skin: Negative for rash.  Neurological: Negative for headaches.  Hematological: Does not bruise/bleed easily.  Psychiatric/Behavioral: Negative for dysphoric mood. The patient is not nervous/anxious.        Objective:   Physical Exam Overweight male in no acute distress Nose without purulence or discharge noted Neck without lymphadenopathy or thyromegaly Lower extremities without edema, no cyanosis Alert and oriented, moves all 4 extremities       Assessment & Plan:

## 2013-09-27 NOTE — Patient Instructions (Signed)
Will start on cpap at a moderate pressure to allow for desensitization.  Please call if having tolerance issues.  Work on weight loss followup with me again in 8 weeks.

## 2013-09-28 ENCOUNTER — Encounter: Payer: Self-pay | Admitting: Family Medicine

## 2013-10-02 ENCOUNTER — Telehealth: Payer: Self-pay | Admitting: Pulmonary Disease

## 2013-10-02 NOTE — Telephone Encounter (Signed)
[facility] is in a meeting. Will need to call back Called pt make aware of this.

## 2013-10-02 NOTE — Telephone Encounter (Signed)
Called spoke with Brandon Schroeder from [facility]. She reports they need Brandon Schroeder to sign "detailed written order" for Brandon Schroeder to be set up that Brandon Schroeder is bringing over later today. I made her aware Brandon Schroeder is leaving tomorrow for [place] and will be gone for several week.  Brandon Schroeder wants to know if another DME can get him set up any faster. Please advise thanks

## 2013-10-03 NOTE — Telephone Encounter (Signed)
I have a detailed written order for supplies from [facility] given to [facility] to see if [facility] will sign. Verdie Mosher

## 2013-10-04 NOTE — Telephone Encounter (Signed)
Brandon Schroeder, Brandon Schroeder [facility] signed this form?

## 2013-10-06 ENCOUNTER — Encounter: Payer: Self-pay | Admitting: Family Medicine

## 2013-10-06 NOTE — Telephone Encounter (Signed)
Form is in your green folder, please let me know when it is complete, thanks. S.ign

## 2013-10-09 NOTE — Telephone Encounter (Signed)
Call in Zolpidem 10 mg qhs #30 with 5 rf 

## 2013-10-09 NOTE — Telephone Encounter (Signed)
Done and given to alida

## 2013-10-11 MED ORDER — ZOLPIDEM TARTRATE 10 MG PO TABS: 10.0000 mg | ORAL_TABLET | Freq: Every evening | ORAL | Status: DC | PRN

## 2013-10-11 NOTE — Telephone Encounter (Signed)
cmn was given to me signed yesterday and I faxed yesterday 10/10/13 to [facility]. Spoke with Maudie Mercury @ [facility] they have cmn. Verdie Mosher

## 2013-10-11 NOTE — Telephone Encounter (Signed)
Alida, have you taken care of this form and sent back to [facility]?  If so, please let us know so we can contact pt and let him know this has been taken care of.  Thank you.

## 2013-10-12 NOTE — Telephone Encounter (Signed)
Pt advised. Jennifer Castillo, CMA  

## 2013-10-12 NOTE — Telephone Encounter (Signed)
lmomtcb for pt to inform him of below.

## 2013-10-20 ENCOUNTER — Telehealth: Payer: Self-pay | Admitting: Pulmonary Disease

## 2013-10-20 DIAGNOSIS — G4733 Obstructive sleep apnea (adult) (pediatric): Secondary | ICD-10-CM

## 2013-10-20 NOTE — Telephone Encounter (Signed)
lmomtcb x1 

## 2013-10-20 NOTE — Telephone Encounter (Signed)
Called spoke with pt. He reports he is still having several events on his CPAP is showing several apneas/ download printed and placed for [place] TO REVIEW. PLEASE ADVISE THANKS

## 2013-10-20 NOTE — Telephone Encounter (Signed)
Let him know that I told him at his visit that we were starting on moderate pressure level, and it may not treat all of his sleep apnea.  I do that so I do not pressure overload pts right off the bat.  His download does show some breakthru apnea, and I am willing to increase pressure if he wants me to.  If he wants to increase pressure, send order to dme to change auto to 5-20cm and leave there.

## 2013-10-24 NOTE — Telephone Encounter (Signed)
Called and spoke to pt. Informed pt of recs per [place]. Pt stated he would like to increase the pressure. Order placed. Pt aware. Nothing further needed.

## 2013-10-30 DIAGNOSIS — L821 Other seborrheic keratosis: Secondary | ICD-10-CM | POA: Diagnosis not present

## 2013-10-30 DIAGNOSIS — L408 Other psoriasis: Secondary | ICD-10-CM | POA: Diagnosis not present

## 2013-10-30 DIAGNOSIS — L819 Disorder of pigmentation, unspecified: Secondary | ICD-10-CM | POA: Diagnosis not present

## 2013-10-30 DIAGNOSIS — D235 Other benign neoplasm of skin of trunk: Secondary | ICD-10-CM | POA: Diagnosis not present

## 2013-11-01 ENCOUNTER — Telehealth: Payer: Self-pay | Admitting: Pulmonary Disease

## 2013-11-17 ENCOUNTER — Encounter: Payer: Self-pay | Admitting: Pulmonary Disease

## 2013-11-17 ENCOUNTER — Ambulatory Visit (INDEPENDENT_AMBULATORY_CARE_PROVIDER_SITE_OTHER): Payer: Medicare Other | Admitting: Pulmonary Disease

## 2013-11-17 ENCOUNTER — Encounter: Payer: Self-pay | Admitting: Family Medicine

## 2013-11-17 ENCOUNTER — Ambulatory Visit (INDEPENDENT_AMBULATORY_CARE_PROVIDER_SITE_OTHER)
Admission: RE | Admit: 2013-11-17 | Discharge: 2013-11-17 | Disposition: A | Discharge status: Home / Self Care | Payer: Medicare Other | Source: Ambulatory Visit | Attending: Pulmonary Disease | Admitting: Pulmonary Disease

## 2013-11-17 VITALS — BP 122/74 | HR 76 | Temp 97.9°F | Ht 75.0 in | Wt 241.8 lb

## 2013-11-17 DIAGNOSIS — I10 Essential (primary) hypertension: Secondary | ICD-10-CM | POA: Diagnosis not present

## 2013-11-17 DIAGNOSIS — R05 Cough: Secondary | ICD-10-CM | POA: Diagnosis not present

## 2013-11-17 DIAGNOSIS — G4733 Obstructive sleep apnea (adult) (pediatric): Secondary | ICD-10-CM

## 2013-11-17 DIAGNOSIS — R0609 Other forms of dyspnea: Secondary | ICD-10-CM

## 2013-11-17 DIAGNOSIS — R053 Chronic cough: Secondary | ICD-10-CM

## 2013-11-17 DIAGNOSIS — Z23 Encounter for immunization: Secondary | ICD-10-CM | POA: Diagnosis not present

## 2013-11-17 DIAGNOSIS — R0602 Shortness of breath: Secondary | ICD-10-CM | POA: Insufficient documentation

## 2013-11-17 MED ORDER — LORAZEPAM 1 MG PO TABS: 1.0000 mg | ORAL_TABLET | Freq: Three times a day (TID) | ORAL | Status: DC | PRN

## 2013-11-17 NOTE — Patient Instructions (Addendum)
Please call Dr. Barbie Banner office and see if he can get you on a different blood pressure medication Try chlorpheniramine 4mg  OTC, and take 2 at bedtime and can take zyrtec later in the morning. Will check chest xray today, and call you with results. Will schedule for breathing tests, and see back again on the same day for review with you.  Will schedule for a titration study with bipap given your complex apnea.

## 2013-11-17 NOTE — Assessment & Plan Note (Signed)
The patient has a chronic cough but I suspect his upper airway in origin. He has definite postnasal drip, and is also on an ACE inhibitor. It is unclear if the medication is the cause of his cough, but it must be discontinued as a trial to see if his cough improves. I would also like to try him on a more aggressive antihistamine regimen.

## 2013-11-17 NOTE — Telephone Encounter (Signed)
Call in #90 with 5 rf 

## 2013-11-17 NOTE — Assessment & Plan Note (Signed)
The patient is not doing well on the auto CPAP setting because of pressure induced central apneas, and also a feeling of claustrophobia. This is a very complicated situation, and he is going to need to go to the sleep Center for a formal bilevel titration. This will allow [place] to control his obstructive events without inducing central apnea, and will also improve his compliance and tolerance with a bilevel device. The patient is agreeable to this approach.

## 2013-11-17 NOTE — Progress Notes (Signed)
   Subjective:    Patient ID: Brandon Schroeder, male    DOB: [birth year], 68 y.o.   MRN: [id number]  HPI The patient is a 68 year old male who I've been asked to see for ongoing pulmonary symptoms. I have seen him for obstructive sleep apnea, and currently he is having issues with cough and dyspnea on exertion. He notes a cough over the last 3-4 months, but does not feel it has been increasing in severity. It is primarily dry and hacking, and occurs about 2-3 times a day. He does note a tickle in his throat, but denies having constant throat clearing. He does have postnasal drip despite being on a nonsedating antihistamine. He also has a history of reflux for which she takes Nexium, and denies any breakthrough symptoms. The patient feels that his exertional tolerance has also declined over the last one year, but remains very functional. He has a past history of rib fractures with what sounds like a traumatic hemothorax on the right. His father also has a history of pulmonary fibrosis of unknown origin. The patient has been on CPAP since his diagnosis, but continues to have issues with tolerance. He does have a feeling of claustrophobia on exhalation, but has not had any mask leak issues. His download today shows a persistent increase in his AHI despite being on the automatic setting. 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   Review of Systems  Constitutional: Negative for fever and unexpected weight change.  HENT: Positive for sore throat. Negative for congestion, dental problem, ear pain, nosebleeds, postnasal drip, rhinorrhea, sinus pressure, sneezing and trouble swallowing.   Eyes: Negative for redness and itching.  Respiratory: Positive for cough. Negative for chest tightness, shortness of breath and wheezing.   Cardiovascular: Negative for palpitations and leg swelling.  Gastrointestinal:  Negative for nausea and vomiting.  Genitourinary: Negative for dysuria.  Musculoskeletal: Negative for joint swelling.  Skin: Negative for rash.  Neurological: Positive for headaches.  Hematological: Does not bruise/bleed easily.  Psychiatric/Behavioral: Negative for dysphoric mood. The patient is not nervous/anxious.        Objective:   Physical Exam Constitutional:  Well developed, no acute distress  HENT:  Nares patent without discharge  Oropharynx without exudate, palate and uvula are trimmed  Eyes:  Perrla, eomi, no scleral icterus  Neck:  No JVD, no TMG  Cardiovascular:  Normal rate, regular rhythm, no rubs or gallops.  No murmurs        Intact distal pulses  Pulmonary :  Normal breath sounds, no stridor or respiratory distress   No rales, rhonchi, or wheezing  Abdominal:  Soft, nondistended, bowel sounds present.  No tenderness noted.   Musculoskeletal:  No lower extremity edema noted.  Lymph Nodes:  No cervical lymphadenopathy noted  Skin:  No cyanosis noted  Neurologic:  Alert, appropriate, moves all 4 extremities without obvious deficit.         Assessment & Plan:

## 2013-11-17 NOTE — Assessment & Plan Note (Signed)
The patient feels that his dyspnea on exertion has increased over the last one year. His lungs are clear to auscultation, but he will obviously need a chest x-ray and pulmonary function studies. His father has a history of pulmonary fibrosis, and obviously the patient is concerned about this. Will see him back once his pulmonary workup is complete.

## 2013-11-23 ENCOUNTER — Ambulatory Visit: Payer: Medicare Other | Admitting: Pulmonary Disease

## 2013-12-04 ENCOUNTER — Ambulatory Visit: Payer: Medicare Other | Admitting: Pulmonary Disease

## 2013-12-04 ENCOUNTER — Encounter (HOSPITAL_COMMUNITY): Payer: Medicare Other

## 2013-12-07 ENCOUNTER — Ambulatory Visit (HOSPITAL_BASED_OUTPATIENT_CLINIC_OR_DEPARTMENT_OTHER): Payer: Medicare Other | Attending: Pulmonary Disease | Admitting: Radiology

## 2013-12-07 VITALS — Ht 74.0 in | Wt 235.0 lb

## 2013-12-07 DIAGNOSIS — Z9989 Dependence on other enabling machines and devices: Secondary | ICD-10-CM

## 2013-12-07 DIAGNOSIS — G4710 Hypersomnia, unspecified: Secondary | ICD-10-CM | POA: Diagnosis not present

## 2013-12-07 DIAGNOSIS — I493 Ventricular premature depolarization: Secondary | ICD-10-CM | POA: Insufficient documentation

## 2013-12-07 DIAGNOSIS — G4733 Obstructive sleep apnea (adult) (pediatric): Secondary | ICD-10-CM | POA: Insufficient documentation

## 2013-12-07 DIAGNOSIS — G4730 Sleep apnea, unspecified: Secondary | ICD-10-CM | POA: Diagnosis present

## 2013-12-08 DIAGNOSIS — G4733 Obstructive sleep apnea (adult) (pediatric): Secondary | ICD-10-CM

## 2013-12-08 NOTE — Progress Notes (Signed)
Pt needs ov to review sleep study.  Thanks.  

## 2013-12-08 NOTE — Sleep Study (Signed)
   NAME: Brandon Schroeder DATE OF BIRTH:  06/09/1945 MEDICAL RECORD NUMBER [id number]  LOCATION: [facility]  PHYSICIAN: [place] OF STUDY: 12/07/2013  SLEEP STUDY TYPE: Nocturnal Polysomnogram               REFERRING PHYSICIAN: Torria Fromer, Armando Reichert, MD  INDICATION FOR STUDY: Hypersomnia with sleep apnea  EPWORTH SLEEPINESS SCORE:  9 HEIGHT: 6\' 2"  (188 cm)  WEIGHT: 235 lb (106.595 kg)    Body mass index is 30.16 kg/(m^2).  NECK SIZE: 16.5 in.  MEDICATIONS: Reviewed in sleep record  SLEEP ARCHITECTURE: The patient had a total sleep time of 253 minutes with no slow-wave sleep and only 31 minutes of REM. Sleep onset latency was prolonged at 105 minutes, and REM onset was normal. Sleep efficiency was poor at 65%.  RESPIRATORY DATA: The patient underwent a bilevel titration study with a medium ResMed air fit F10 full face mask. The pressure was increased in order to treat both obstructive and central events, as well as snoring. The patient appeared to be well controlled with a pressure of 11/7 and told the very end of the study when he began to develop large numbers of central apneas. He was tried on 12/8 for a short period of time, with persistence of his moderate central events.  OXYGEN DATA: There was transient oxygen desaturation as low as 92% during the study.  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 There were no abnormal behaviors.  IMPRESSION/ RECOMMENDATION:    1) bilevel titration study reveals moderate control of the patient's obstructive and central events with a pressure of 11/7 delivered by a medium ResMed air fit F10 full face mask. 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  2) occasional PVC, but no clinically significant arrhythmias were seen.     [place], American Board  of Sleep Medicine  ELECTRONICALLY SIGNED ON:  12/08/2013, 5:56 PM [place] PH: [phone]   FX: [phone] [place]

## 2013-12-11 ENCOUNTER — Encounter: Payer: Self-pay | Admitting: Pulmonary Disease

## 2013-12-11 ENCOUNTER — Ambulatory Visit (INDEPENDENT_AMBULATORY_CARE_PROVIDER_SITE_OTHER): Payer: Medicare Other | Admitting: Pulmonary Disease

## 2013-12-11 VITALS — BP 114/70 | HR 86 | Temp 97.0°F | Ht 74.0 in | Wt 239.8 lb

## 2013-12-11 DIAGNOSIS — G4737 Central sleep apnea in conditions classified elsewhere: Secondary | ICD-10-CM

## 2013-12-11 DIAGNOSIS — G4733 Obstructive sleep apnea (adult) (pediatric): Secondary | ICD-10-CM | POA: Diagnosis not present

## 2013-12-11 DIAGNOSIS — G4731 Primary central sleep apnea: Secondary | ICD-10-CM

## 2013-12-11 NOTE — Assessment & Plan Note (Signed)
The patient has complex sleep apnea that was not controlled during a recent bilevel titration study. It is obvious he is going to need an ASV device for proper treatment. I will go ahead and order this, and used the auto settings in order to make adjustments. I have also encouraged the patient to work aggressively on weight loss.

## 2013-12-11 NOTE — Progress Notes (Signed)
   Subjective:    Patient ID: Brandon Schroeder, male    DOB: [birth year], 68 y.o.   MRN: [id number]  HPI The patient comes in today for follow-up after his recent titration study for complex sleep apnea. He initially was well controlled on a bilevel pressure of 11/7, and at the end of the study began to have worsening central apneas that did not respond to higher pressure. It is obvious that bilevel was not going to be a great treatment option for him. He was also noted to have 218 periodic limb movements, but he had very little sleep disruption associated with them. I have reviewed the study with him in detail, answered all of his questions.   Review of Systems  Constitutional: Negative for fever and unexpected weight change.  HENT: Negative for congestion, dental problem, ear pain, nosebleeds, postnasal drip, rhinorrhea, sinus pressure, sneezing, sore throat and trouble swallowing.   Eyes: Negative for redness and itching.  Respiratory: Negative for cough, chest tightness, shortness of breath and wheezing.   Cardiovascular: Negative for palpitations and leg swelling.  Gastrointestinal: Negative for nausea and vomiting.  Genitourinary: Negative for dysuria.  Musculoskeletal: Negative for joint swelling.  Skin: Negative for rash.  Neurological: Negative for headaches.  Hematological: Does not bruise/bleed easily.  Psychiatric/Behavioral: Negative for dysphoric mood. The patient is not nervous/anxious.        Objective:   Physical Exam Overweight male in no acute distress Nose without purulence or discharge noted No skin breakdown or pressure necrosis from C Pap mask Neck without lymphadenopathy or thyromegaly Lower extremities without edema, no cyanosis Alert and oriented, moves all 4 extremities.       Assessment & Plan:

## 2013-12-11 NOTE — Patient Instructions (Signed)
Will start you on an ASV device that will treat both your obstructive and central events.  Please call me if tolerance issues. Would like to get a download off your machine in 3-4 weeks.  Please call us after 3-4 weeks so we can get a wireless download off your device.  Keep followup apptm for your breathing tests.

## 2013-12-11 NOTE — Progress Notes (Signed)
Pt is scheduled to come in today. 

## 2013-12-11 NOTE — Progress Notes (Signed)
lmtcb

## 2013-12-12 ENCOUNTER — Ambulatory Visit (INDEPENDENT_AMBULATORY_CARE_PROVIDER_SITE_OTHER): Payer: Medicare Other | Admitting: Family Medicine

## 2013-12-12 ENCOUNTER — Encounter: Payer: Self-pay | Admitting: Family Medicine

## 2013-12-12 VITALS — BP 103/73 | HR 76 | Temp 97.5°F | Ht 74.0 in | Wt 238.0 lb

## 2013-12-12 DIAGNOSIS — N401 Enlarged prostate with lower urinary tract symptoms: Secondary | ICD-10-CM | POA: Diagnosis not present

## 2013-12-12 DIAGNOSIS — G2581 Restless legs syndrome: Secondary | ICD-10-CM

## 2013-12-12 DIAGNOSIS — R972 Elevated prostate specific antigen [PSA]: Secondary | ICD-10-CM

## 2013-12-12 DIAGNOSIS — N289 Disorder of kidney and ureter, unspecified: Secondary | ICD-10-CM | POA: Diagnosis not present

## 2013-12-12 DIAGNOSIS — G4737 Central sleep apnea in conditions classified elsewhere: Secondary | ICD-10-CM

## 2013-12-12 DIAGNOSIS — N138 Other obstructive and reflux uropathy: Secondary | ICD-10-CM

## 2013-12-12 DIAGNOSIS — G4733 Obstructive sleep apnea (adult) (pediatric): Secondary | ICD-10-CM | POA: Diagnosis not present

## 2013-12-12 DIAGNOSIS — I10 Essential (primary) hypertension: Secondary | ICD-10-CM

## 2013-12-12 DIAGNOSIS — G4731 Primary central sleep apnea: Secondary | ICD-10-CM

## 2013-12-12 LAB — BASIC METABOLIC PANEL
BUN: 20 mg/dL (ref 6–23)
CALCIUM: 9.6 mg/dL (ref 8.4–10.5)
CO2: 24 meq/L (ref 19–32)
CREATININE: 1.5 mg/dL (ref 0.4–1.5)
Chloride: 101 mEq/L (ref 96–112)
GFR: 49.6 mL/min — ABNORMAL LOW (ref >60.00)
Glucose, Bld: 84 mg/dL (ref 70–99)
Potassium: 4.4 mEq/L (ref 3.5–5.1)
Sodium: 139 mEq/L (ref 135–145)

## 2013-12-12 LAB — PSA: PSA: 5.04 ng/mL — AB (ref 0.10–4.00)

## 2013-12-12 MED ORDER — AMLODIPINE BESYLATE 5 MG PO TABS: 5.0000 mg | ORAL_TABLET | Freq: Every day | ORAL | Status: DC

## 2013-12-12 MED ORDER — TRAZODONE HCL 50 MG PO TABS: 50.0000 mg | ORAL_TABLET | Freq: Every day | ORAL | Status: DC

## 2013-12-12 MED ORDER — ESOMEPRAZOLE MAGNESIUM 20 MG PO CPDR: 20.0000 mg | DELAYED_RELEASE_CAPSULE | Freq: Every day | ORAL | Status: DC

## 2013-12-12 NOTE — Progress Notes (Signed)
   Subjective:    Patient ID: Brandon Schroeder, male    DOB: [birth year], 68 y.o.   MRN: [id number]  HPI Here to follow up a visit to Dr. Gwenette Greet for his sleep apnea. It seems his sleep apnea of more of a central nervous system problem than it is with obstruction. He is setting him up for a different type of evaluation. He also suggested we avoid sleep meds that could depress his respiratory drive center and to try Trazodone. Also in August we treated him for a prostate infection and we need to follow up a PSA and his renal status. Finally he had mentioned a dry cough to Dr. Gwenette Greet who said his BP meds may be a cause for this.    Review of Systems  Constitutional: Positive for fatigue.  Respiratory: Positive for cough. Negative for chest tightness and wheezing.   Cardiovascular: Negative.   Psychiatric/Behavioral: Positive for sleep disturbance.       Objective:   Physical Exam  Constitutional: He is oriented to person, place, and time. He appears well-developed and well-nourished.  Cardiovascular: Normal rate, regular rhythm, normal heart sounds and intact distal pulses.   Pulmonary/Chest: Effort normal and breath sounds normal.  Neurological: He is alert and oriented to person, place, and time.          Assessment & Plan:  We will stop Benazepril and switch to plain Amlodipine 5 mg daily. Try Trazodone 50 mg qhs.

## 2013-12-12 NOTE — Progress Notes (Signed)
Pre visit review using our clinic review tool, if applicable. No additional management support is needed unless otherwise documented below in the visit note. 

## 2013-12-13 ENCOUNTER — Encounter (HOSPITAL_COMMUNITY): Payer: Medicare Other

## 2013-12-13 ENCOUNTER — Ambulatory Visit: Payer: Medicare Other | Admitting: Pulmonary Disease

## 2013-12-13 NOTE — Addendum Note (Signed)
Addended by: Alysia Penna A on: 12/13/2013 08:40 AM   Modules accepted: Orders

## 2013-12-19 ENCOUNTER — Encounter: Payer: Self-pay | Admitting: Family Medicine

## 2013-12-21 DIAGNOSIS — R413 Other amnesia: Secondary | ICD-10-CM | POA: Diagnosis not present

## 2013-12-28 DIAGNOSIS — R413 Other amnesia: Secondary | ICD-10-CM | POA: Diagnosis not present

## 2013-12-29 ENCOUNTER — Ambulatory Visit (INDEPENDENT_AMBULATORY_CARE_PROVIDER_SITE_OTHER): Payer: Medicare Other | Admitting: Pulmonary Disease

## 2013-12-29 ENCOUNTER — Encounter: Payer: Self-pay | Admitting: Pulmonary Disease

## 2013-12-29 VITALS — BP 136/68 | HR 87 | Temp 97.5°F | Ht 73.0 in | Wt 232.0 lb

## 2013-12-29 DIAGNOSIS — R05 Cough: Secondary | ICD-10-CM

## 2013-12-29 DIAGNOSIS — R0609 Other forms of dyspnea: Secondary | ICD-10-CM

## 2013-12-29 DIAGNOSIS — G4731 Primary central sleep apnea: Secondary | ICD-10-CM

## 2013-12-29 DIAGNOSIS — G4737 Central sleep apnea in conditions classified elsewhere: Secondary | ICD-10-CM | POA: Diagnosis not present

## 2013-12-29 DIAGNOSIS — G4733 Obstructive sleep apnea (adult) (pediatric): Secondary | ICD-10-CM

## 2013-12-29 DIAGNOSIS — R053 Chronic cough: Secondary | ICD-10-CM

## 2013-12-29 LAB — PULMONARY FUNCTION TEST
DL/VA % pred: 77 %
DL/VA: 3.7 ml/min/mmHg/L
DLCO unc % pred: 73 %
DLCO unc: 26.66 ml/min/mmHg
FEF 25-75 PRE: 3.71 L/s
FEF 25-75 Post: 4.02 L/sec
FEF2575-%CHANGE-POST: 8 %
FEF2575-%PRED-POST: 138 %
FEF2575-%Pred-Pre: 127 %
FEV1-%CHANGE-POST: 1 %
FEV1-%PRED-POST: 120 %
FEV1-%PRED-PRE: 118 %
FEV1-Post: 4.52 L
FEV1-Pre: 4.44 L
FEV1FVC-%CHANGE-POST: 2 %
FEV1FVC-%Pred-Pre: 103 %
FEV6-%Change-Post: 0 %
FEV6-%PRED-POST: 118 %
FEV6-%Pred-Pre: 119 %
FEV6-PRE: 5.71 L
FEV6-Post: 5.7 L
FEV6FVC-%Change-Post: 0 %
FEV6FVC-%PRED-PRE: 104 %
FEV6FVC-%Pred-Post: 104 %
FVC-%Change-Post: -1 %
FVC-%Pred-Post: 113 %
FVC-%Pred-Pre: 114 %
FVC-POST: 5.73 L
FVC-Pre: 5.79 L
POST FEV1/FVC RATIO: 79 %
POST FEV6/FVC RATIO: 99 %
PRE FEV6/FVC RATIO: 99 %
Pre FEV1/FVC ratio: 77 %
RV % PRED: 61 %
RV: 1.57 L
TLC % PRED: 95 %
TLC: 7.29 L

## 2013-12-29 NOTE — Assessment & Plan Note (Signed)
Patient just started on his ASV device for his complex sleep apnea, and the one day download shows great control of his AHI. We will need to get a follow-up download in approximately 3 weeks to make sure that everything is going properly.

## 2013-12-29 NOTE — Progress Notes (Signed)
   Subjective:    Patient ID: [place], male    DOB: 08-05-1945, 68 y.o.   MRN: [id number]  HPI The patient comes in today for follow-up of his complex sleep apnea and recent PFTs for evaluation of dyspnea on exertion. His PFTs showed no airflow obstruction, no restriction, and a minimal decrease in DLCO. His chest x-ray showed no interstitial lung disease. The patient just got his ASV device, and has only worn 1 day. His download shows great compliance and excellent control of his AHI.   Review of Systems  Constitutional: Negative for fever and unexpected weight change.  HENT: Negative for congestion, dental problem, ear pain, nosebleeds, postnasal drip, rhinorrhea, sinus pressure, sneezing, sore throat and trouble swallowing.   Eyes: Negative for redness and itching.  Respiratory: Negative for cough, chest tightness, shortness of breath and wheezing.   Cardiovascular: Negative for palpitations and leg swelling.  Gastrointestinal: Negative for nausea and vomiting.  Genitourinary: Negative for dysuria.  Musculoskeletal: Negative for joint swelling.  Skin: Negative for rash.  Neurological: Negative for headaches.  Hematological: Does not bruise/bleed easily.  Psychiatric/Behavioral: Negative for dysphoric mood. The patient is not nervous/anxious.        Objective:   Physical Exam Overweight male in no acute distress Nose without purulence or discharge noted Neck without lymphadenopathy or thyromegaly No skin breakdown or pressure necrosis from the C Pap mask Lower extremities without edema, no cyanosis Alert and oriented, moves all 4 extremities.       Assessment & Plan:

## 2013-12-29 NOTE — Progress Notes (Signed)
PFT Done. Brandon Schroeder, CMA  

## 2013-12-29 NOTE — Patient Instructions (Signed)
Stay on your ASV device.  It appears from the one day on the download that your apnea is well controlled. Will have my nurse get another download in 3-4 weeks for my review.  Let us know if you do not hear from [place]. Work on weight loss and conditioning.  followup with me again in 33mos.

## 2013-12-29 NOTE — Assessment & Plan Note (Signed)
The patient's PFTs showed no obstruction and no restriction, and a minimal decrease in diffusion capacity. I do not see anything to suggest pulmonary fibrosis at this time, but understand his concern with a family member having IPF. I have asked him to work on weight loss and conditioning, and if he continues to have dyspnea on exertion, would consider a cardiac evaluation. We can keep an eye on his chest x-ray in symptoms going forward.

## 2014-01-22 DIAGNOSIS — R3911 Hesitancy of micturition: Secondary | ICD-10-CM | POA: Diagnosis not present

## 2014-01-22 DIAGNOSIS — R312 Other microscopic hematuria: Secondary | ICD-10-CM | POA: Diagnosis not present

## 2014-01-22 DIAGNOSIS — N289 Disorder of kidney and ureter, unspecified: Secondary | ICD-10-CM | POA: Diagnosis not present

## 2014-01-22 DIAGNOSIS — N401 Enlarged prostate with lower urinary tract symptoms: Secondary | ICD-10-CM | POA: Diagnosis not present

## 2014-01-22 DIAGNOSIS — R972 Elevated prostate specific antigen [PSA]: Secondary | ICD-10-CM | POA: Diagnosis not present

## 2014-01-22 DIAGNOSIS — N200 Calculus of kidney: Secondary | ICD-10-CM | POA: Diagnosis not present

## 2014-01-29 ENCOUNTER — Telehealth: Payer: Self-pay | Admitting: Pulmonary Disease

## 2014-01-29 ENCOUNTER — Encounter (HOSPITAL_BASED_OUTPATIENT_CLINIC_OR_DEPARTMENT_OTHER): Payer: Medicare Other

## 2014-01-29 NOTE — Telephone Encounter (Signed)
Mindy, let pt know that his download shows great control of his sleep apnea, and no significant mask leak. Hopefully, he feels the device is helping his sleep.

## 2014-01-29 NOTE — Telephone Encounter (Signed)
lmomtcb x1 

## 2014-01-30 DIAGNOSIS — R972 Elevated prostate specific antigen [PSA]: Secondary | ICD-10-CM | POA: Diagnosis not present

## 2014-01-30 NOTE — Telephone Encounter (Signed)
lmomtcb x 2  

## 2014-01-31 NOTE — Telephone Encounter (Signed)
lmomtcb x3 for pt Called alternative # for spouse Romie Minus [phone]--OTLXB as well

## 2014-02-01 NOTE — Telephone Encounter (Signed)
Pt aware of results.  Wants to know the "exact numbers" on his download as far as his events per hour.  I don't see this in his chart. [place] please advise.  Thank you.

## 2014-02-01 NOTE — Telephone Encounter (Signed)
lmtcb for pt.  

## 2014-02-01 NOTE — Telephone Encounter (Signed)
Pt returning call.Brandon Schroeder ° °

## 2014-02-01 NOTE — Telephone Encounter (Signed)
I did not scan into computer.  If he wants this, will need to get the report again for review.

## 2014-02-05 ENCOUNTER — Ambulatory Visit (INDEPENDENT_AMBULATORY_CARE_PROVIDER_SITE_OTHER): Payer: Medicare Other | Admitting: Neurology

## 2014-02-05 ENCOUNTER — Encounter: Payer: Self-pay | Admitting: Neurology

## 2014-02-05 VITALS — BP 143/93 | HR 73 | Ht 75.0 in | Wt 236.2 lb

## 2014-02-05 DIAGNOSIS — F4310 Post-traumatic stress disorder, unspecified: Secondary | ICD-10-CM | POA: Diagnosis not present

## 2014-02-05 DIAGNOSIS — R4189 Other symptoms and signs involving cognitive functions and awareness: Secondary | ICD-10-CM

## 2014-02-05 DIAGNOSIS — F329 Major depressive disorder, single episode, unspecified: Secondary | ICD-10-CM

## 2014-02-05 DIAGNOSIS — F6811 Factitious disorder with predominantly psychological signs and symptoms: Secondary | ICD-10-CM | POA: Diagnosis not present

## 2014-02-05 DIAGNOSIS — Z86010 Personal history of colonic polyps: Secondary | ICD-10-CM | POA: Diagnosis not present

## 2014-02-05 DIAGNOSIS — E538 Deficiency of other specified B group vitamins: Secondary | ICD-10-CM | POA: Diagnosis not present

## 2014-02-05 DIAGNOSIS — F32A Depression, unspecified: Secondary | ICD-10-CM

## 2014-02-05 NOTE — Patient Instructions (Addendum)
Overall you are doing fairly well but I do want to suggest a few things today:   Remember to drink plenty of fluid, eat healthy meals and do not skip any meals. Try to eat protein with a every meal and eat a healthy snack such as fruit or nuts in between meals. Try to keep a regular sleep-wake schedule and try to exercise daily, particularly in the form of walking, 20-30 minutes a day, if you can.   As far as your medications are concerned, I would like to suggest: B12 1000 to 2081mcg daily  As far as diagnostic testing: Nuclear Medicine FDG glucose uptake brain scan, labs  I would like to see you back in 3 months, sooner if we need to. Please call us with any interim questions, concerns, problems, updates or refill requests.   Please also call us for any test results so we can go over those with you on the phone.  My clinical assistant and will answer any of your questions and relay your messages to me and also relay most of my messages to you.   Our phone number is [phone]. We also have an after hours call service for urgent matters and there is a physician on-call for urgent questions. For any emergencies you know to call 911 or go to the nearest emergency room
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
## 2014-02-08 LAB — BASIC METABOLIC PANEL
BUN / CREAT RATIO: 10 (ref 10–22)
BUN: 12 mg/dL (ref 8–27)
CO2: 29 mmol/L (ref 18–29)
Calcium: 9.5 mg/dL (ref 8.6–10.2)
Chloride: 99 mmol/L (ref 97–108)
Creatinine, Ser: 1.17 mg/dL (ref 0.76–1.27)
GFR, EST AFRICAN AMERICAN: 74 mL/min/{1.73_m2} (ref >59)
GFR, EST NON AFRICAN AMERICAN: 64 mL/min/{1.73_m2} (ref >59)
Glucose: 111 mg/dL — ABNORMAL HIGH (ref 65–99)
Potassium: 3.2 mmol/L — ABNORMAL LOW (ref 3.5–5.2)
SODIUM: 143 mmol/L (ref 134–144)

## 2014-02-08 LAB — VITAMIN B1, WHOLE BLOOD: THIAMINE: 142.8 nmol/L (ref 66.5–200.0)

## 2014-02-08 LAB — B12 AND FOLATE PANEL
Folate: 15.7 ng/mL (ref >3.0)
Vitamin B-12: 354 pg/mL (ref 211–946)

## 2014-02-08 LAB — METHYLMALONIC ACID, SERUM: METHYLMALONIC ACID: 286 nmol/L (ref 0–378)

## 2014-02-21 ENCOUNTER — Telehealth: Payer: Self-pay | Admitting: Neurology

## 2014-02-21 NOTE — Telephone Encounter (Signed)
Patient is scheduled for a NM on 02/23/14 and has an appointment with a Nephrologist on the following week, will precedure interfere with visit? Suggested  to patient to contact Nephrologist office and find out what the visit will consist of , he verbalized understanding and said that he would. He also said that he has new insurance and wanted to inform , asked that he bring the new card for upcoming visit.

## 2014-02-22 DIAGNOSIS — R4189 Other symptoms and signs involving cognitive functions and awareness: Secondary | ICD-10-CM | POA: Insufficient documentation

## 2014-02-23 ENCOUNTER — Encounter (HOSPITAL_COMMUNITY): Payer: Medicare HMO

## 2014-03-23 ENCOUNTER — Ambulatory Visit (INDEPENDENT_AMBULATORY_CARE_PROVIDER_SITE_OTHER): Payer: Medicare HMO | Admitting: Family Medicine

## 2014-03-23 ENCOUNTER — Encounter: Payer: Self-pay | Admitting: Family Medicine

## 2014-03-23 VITALS — BP 128/86 | HR 85 | Temp 98.9°F | Ht 75.0 in | Wt 234.0 lb

## 2014-03-23 DIAGNOSIS — F4310 Post-traumatic stress disorder, unspecified: Secondary | ICD-10-CM

## 2014-03-23 DIAGNOSIS — R4189 Other symptoms and signs involving cognitive functions and awareness: Secondary | ICD-10-CM

## 2014-03-23 DIAGNOSIS — F6811 Factitious disorder with predominantly psychological signs and symptoms: Secondary | ICD-10-CM

## 2014-03-23 DIAGNOSIS — J0100 Acute maxillary sinusitis, unspecified: Secondary | ICD-10-CM

## 2014-03-23 MED ORDER — AZITHROMYCIN 250 MG PO TABS
ORAL_TABLET | ORAL | Status: DC
Start: 2014-03-23 — End: 2014-05-28

## 2014-03-23 MED ORDER — HYDROCHLOROTHIAZIDE 25 MG PO TABS: 25.0000 mg | ORAL_TABLET | Freq: Every day | ORAL | Status: DC

## 2014-03-23 NOTE — Progress Notes (Signed)
Pre visit review using our clinic review tool, if applicable. No additional management support is needed unless otherwise documented below in the visit note. 

## 2014-03-23 NOTE — Progress Notes (Signed)
   Subjective:    Patient ID: Brandon Schroeder, male    DOB: October 29, 1945, 69 y.o.   MRN: [id number]  HPI Here for several things. First he has had one week of sinus pressure, PND, a dry cough, and blowing yellow mucus form the nose. No fever. Also he asks my recommendations about seeing a psychiatrist. 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 He accepts this and wants to establish with a psychiatrist.    Review of Systems  Constitutional: Negative.   HENT: Positive for congestion, postnasal drip and sinus pressure.   Eyes: Negative.   Respiratory: Positive for cough.   Psychiatric/Behavioral: Positive for dysphoric mood and decreased concentration. The patient is nervous/anxious.        Objective:   Physical Exam  Constitutional: He is oriented to person, place, and time. He appears well-developed and well-nourished.  HENT:  Right Ear: External ear normal.  Left Ear: External ear normal.  Nose: Nose normal.  Mouth/Throat: Oropharynx is clear and moist.  Eyes: Conjunctivae are normal.  Pulmonary/Chest: Effort normal and breath sounds normal.  Lymphadenopathy:    He has no cervical adenopathy.  Neurological: He is alert and oriented to person, place, and time.  Psychiatric: He has a normal mood and affect. His behavior is normal. Thought content normal.          Assessment & Plan:  Treat the sinusitis with a Zpack. I advised him to check with his insurance company to see what psychiatrists  are covered in [place], then I could advise him on picking someone from this list.

## 2014-04-09 ENCOUNTER — Other Ambulatory Visit: Payer: Self-pay | Admitting: Family Medicine

## 2014-04-30 ENCOUNTER — Other Ambulatory Visit: Payer: Self-pay | Admitting: Family Medicine

## 2014-05-02 NOTE — Telephone Encounter (Signed)
Call in #[phone] with 5 rf 

## 2014-05-03 ENCOUNTER — Telehealth: Payer: Self-pay | Admitting: Family Medicine

## 2014-05-03 NOTE — Telephone Encounter (Signed)
Do we need to take Ambien 5 mg off of current medication list? You did approve the 10 mg, however I have not called it in yet?

## 2014-05-03 NOTE — Telephone Encounter (Signed)
Please take the 5 mg Zolpidem off his med list since he is NOT taking it

## 2014-05-03 NOTE — Telephone Encounter (Signed)
I updated medication list and called in script.

## 2014-05-10 ENCOUNTER — Encounter: Payer: Self-pay | Admitting: Family Medicine

## 2014-05-10 MED ORDER — SERTRALINE HCL 100 MG PO TABS: 100.0000 mg | ORAL_TABLET | Freq: Two times a day (BID) | ORAL | Status: DC

## 2014-05-10 NOTE — Telephone Encounter (Signed)
Rx sent to pharmacy for 1 year supply.

## 2014-05-10 NOTE — Telephone Encounter (Signed)
Please do this for one year

## 2014-05-24 ENCOUNTER — Telehealth: Payer: Self-pay | Admitting: Family Medicine

## 2014-05-24 NOTE — Telephone Encounter (Signed)
Noted  

## 2014-05-24 NOTE — Telephone Encounter (Signed)
[facility] - Client [place] [facility] Patient Name: Brandon Schroeder DOB: Feb 14, 1946 Initial Comment Caller states, was prescribed Z pack for a sinus infection, he finished it a month ago, now the symptoms have returned Nurse Assessment Nurse: Markus Daft, RN, Sherre Poot Date/Time (Eastern Time): 05/24/2014 9:58:37 AM Confirm and document reason for call. If symptomatic, describe symptoms. ---Caller states that he was prescribed Z pack for a sinus infection that he finished it a month ago. Now the symptoms have returned with sore throat, fatigue, and wife can smell an odd odor from his nose and mouth, nasal congestion - yellow to green, sinus pressure. Rare dry cough. No fever. Has the patient traveled out of the country within the last 30 days? ---Not Applicable Does the patient require triage? ---Yes Related visit to physician within the last 2 weeks? ---No Does the PT have any chronic conditions? (i.e. diabetes, asthma, etc.) ---No Guidelines Guideline Title Affirmed Question Affirmed Notes Sinus Pain or Congestion [1] Sinus congestion as part of a cold AND [2] present < 10 days (all triage questions negative) s/s started one wk ago; using saline nasal washes Final Disposition User [place], RN, Sherre Poot Comments Caller states that he just didn't know if he needed another antibiotic or not. Home advice given. Pt verbalized understanding.

## 2014-05-28 ENCOUNTER — Ambulatory Visit (INDEPENDENT_AMBULATORY_CARE_PROVIDER_SITE_OTHER): Payer: Medicare HMO | Admitting: Family Medicine

## 2014-05-28 ENCOUNTER — Encounter: Payer: Self-pay | Admitting: Family Medicine

## 2014-05-28 VITALS — BP 135/92 | HR 84 | Temp 98.8°F | Ht 75.0 in | Wt 231.0 lb

## 2014-05-28 DIAGNOSIS — J0100 Acute maxillary sinusitis, unspecified: Secondary | ICD-10-CM | POA: Diagnosis not present

## 2014-05-28 MED ORDER — CEFUROXIME AXETIL 500 MG PO TABS: 500.0000 mg | ORAL_TABLET | Freq: Two times a day (BID) | ORAL | Status: DC

## 2014-05-28 NOTE — Progress Notes (Signed)
Pre visit review using our clinic review tool, if applicable. No additional management support is needed unless otherwise documented below in the visit note. 

## 2014-05-28 NOTE — Progress Notes (Signed)
   Subjective:    Patient ID: Brandon Schroeder, male    DOB: Aug 06, 1945, 69 y.o.   MRN: [id number]  HPI Here for 2 months of sinus congestion and PND. No fever or cough. He was given a Zpack at first but this did not help.   Review of Systems  Constitutional: Negative.   HENT: Positive for congestion, postnasal drip and sinus pressure.   Eyes: Negative.   Respiratory: Negative.        Objective:   Physical Exam  Constitutional: He appears well-developed and well-nourished.  HENT:  Right Ear: External ear normal.  Left Ear: External ear normal.  Nose: Nose normal.  Mouth/Throat: Oropharynx is clear and moist.  Eyes: Conjunctivae are normal.  Pulmonary/Chest: Effort normal and breath sounds normal.  Lymphadenopathy:    He has no cervical adenopathy.          Assessment & Plan:  Add Mucinex

## 2014-05-29 NOTE — Progress Notes (Signed)
   Subjective:    Patient ID: Brandon Schroeder, male    DOB: 26-Aug-1945, 69 y.o.   MRN: [id number]  HPI    Review of Systems     Objective:   Physical Exam        Assessment & Plan:  He has partially treated sinusitis. Given a course of Ceftin.

## 2014-06-07 ENCOUNTER — Telehealth: Payer: Self-pay | Admitting: Pulmonary Disease

## 2014-06-07 NOTE — Telephone Encounter (Signed)
Called and spoke to pt. Pt is wanted to change DME companies from [facility] to [facility] because of an increase in pay ($61 per month) and because of general dissatisfaction. Pt is questioning if [facility] is going to be cheaper per month than [employer]. Called and spoke to [facility] with [facility] to see if she can run through [employer] to see what monthly cost would be. Pt is on Bipap.   Will await Melissa to call back.

## 2014-06-07 NOTE — Telephone Encounter (Signed)
Melissa called back and stated that the pt is still on a rental bipap and has only made 2 payments out of the 13 payments. [facility] will not take this pt on as "it is too risky", once pt completes the payments then [facility] can take the pt.  lmtcb for pt to inform.

## 2014-06-07 NOTE — Telephone Encounter (Signed)
Pt returning call.Brandon Schroeder ° °

## 2014-06-07 NOTE — Telephone Encounter (Signed)
LMTCB x 1 

## 2014-06-08 ENCOUNTER — Ambulatory Visit (HOSPITAL_COMMUNITY): Payer: Medicare HMO | Attending: Cardiology | Admitting: *Deleted

## 2014-06-08 ENCOUNTER — Ambulatory Visit (INDEPENDENT_AMBULATORY_CARE_PROVIDER_SITE_OTHER): Payer: Medicare HMO | Admitting: Family Medicine

## 2014-06-08 ENCOUNTER — Encounter: Payer: Self-pay | Admitting: Family Medicine

## 2014-06-08 VITALS — BP 130/86 | HR 86 | Temp 98.3°F | Ht 75.0 in | Wt 232.0 lb

## 2014-06-08 DIAGNOSIS — M7989 Other specified soft tissue disorders: Secondary | ICD-10-CM | POA: Insufficient documentation

## 2014-06-08 DIAGNOSIS — I10 Essential (primary) hypertension: Secondary | ICD-10-CM | POA: Insufficient documentation

## 2014-06-08 DIAGNOSIS — M79604 Pain in right leg: Secondary | ICD-10-CM | POA: Diagnosis not present

## 2014-06-08 MED ORDER — EPINEPHRINE 0.3 MG/0.3ML IJ SOAJ: 0.3000 mg | Freq: Once | INTRAMUSCULAR | Status: DC

## 2014-06-08 MED ORDER — RIVAROXABAN 15 MG PO TABS: 15.0000 mg | ORAL_TABLET | Freq: Two times a day (BID) | ORAL | Status: DC

## 2014-06-08 NOTE — Progress Notes (Signed)
   Subjective:    Patient ID: Boston Service, male    DOB: 09/27/1945, 69 y.o.   MRN: [id number]  HPI Here for 3 days of swelling and pain in the right leg. No recent trauma. He does note that he had spent a lot of time driving in his car for the 2 days before the swelling started. No SOB or chest pain.    Review of Systems  Constitutional: Negative.   Respiratory: Negative.   Cardiovascular: Positive for leg swelling. Negative for chest pain and palpitations.       Objective:   Physical Exam  Constitutional: He appears well-developed and well-nourished. No distress.  Cardiovascular: Normal rate, regular rhythm, normal heart sounds and intact distal pulses.   Pulmonary/Chest: Effort normal and breath sounds normal.  Musculoskeletal:  2+ edema in the right leg from the middle thigh to the foot. No cords are palpable but he is tender in the popliteal space and in the calf. Bevelyn Buckles is positive.           Assessment & Plan:  Probable DVT in the right leg. He is given samples for 7 days of Xarelto to take 15 mg bid. We will send him for a venous doppler today. If this is positive, we will refer him for a hypercoagulability workup.

## 2014-06-08 NOTE — Telephone Encounter (Signed)
Pt returned call. Informed pt of the recs per Melissa at [facility]. Pt verbalized understanding and denied any further questions or concerns at this time.

## 2014-06-08 NOTE — Progress Notes (Signed)
Unilateral Lower Extremity Venous Duplex Exam - Right - Negative for DVT

## 2014-06-08 NOTE — Progress Notes (Signed)
Pre visit review using our clinic review tool, if applicable. No additional management support is needed unless otherwise documented below in the visit note. 

## 2014-06-27 ENCOUNTER — Telehealth: Payer: Self-pay | Admitting: Family Medicine

## 2014-06-27 NOTE — Telephone Encounter (Signed)
I submitted a Tier Exception for patient's Epipen and it was denied.  Patient's plan states the medication at listed as a Tier 3 and that is the lowest tier available. Patient must pay Tier 3 cost.

## 2014-06-29 ENCOUNTER — Encounter: Payer: Self-pay | Admitting: Pulmonary Disease

## 2014-06-29 ENCOUNTER — Ambulatory Visit (INDEPENDENT_AMBULATORY_CARE_PROVIDER_SITE_OTHER): Payer: Medicare HMO | Admitting: Pulmonary Disease

## 2014-06-29 VITALS — BP 115/82 | HR 79 | Temp 99.0°F | Ht 74.5 in | Wt 226.6 lb

## 2014-06-29 DIAGNOSIS — R0609 Other forms of dyspnea: Secondary | ICD-10-CM

## 2014-06-29 DIAGNOSIS — G4737 Central sleep apnea in conditions classified elsewhere: Secondary | ICD-10-CM

## 2014-06-29 DIAGNOSIS — G4733 Obstructive sleep apnea (adult) (pediatric): Secondary | ICD-10-CM

## 2014-06-29 DIAGNOSIS — G4731 Primary central sleep apnea: Secondary | ICD-10-CM

## 2014-06-29 NOTE — Assessment & Plan Note (Signed)
The patient is wearing his ASV device compliantly, but is only getting about 5 hours of sleep a night. His download shows great control of his AHI and no significant mask leak, but he is not happy with his current mask. He is also having a lot of dryness, but has his heated humidifier turned up and is using water. I suspect this is related to his medication regimen. Although he has not totally satisfied with his device, and his wife states that his sleep is much improved and he has very little breakthrough snoring.

## 2014-06-29 NOTE — Patient Instructions (Signed)
Will refer you to advanced for your cpap per your request. Keep up with mask cushions and supplies, and can arrange for mask fitting at the sleep center if you continue to have issues.  If your shortness of breath continues despite working on conditioning, would discuss possible cardiac evaluation with your primary doctor. followup with Dr. Halford Chessman in one year.

## 2014-06-29 NOTE — Progress Notes (Signed)
   Subjective:    Patient ID: Brandon Schroeder, male    DOB: 1946/02/16, 69 y.o.   MRN: [id number]  HPI The patient comes in today for follow-up of his complex sleep apnea.  He was started on an ASV device, and his wife feels that his sleep is much improved with very little breakthrough snoring. He is wearing his device compliantly by his download, with good control of his AHI. He is not totally satisfied with his device because of mask fit issues and also dryness. He also continues to have dyspnea on exertion, but his pulmonary function studies and x-rays were unremarkable. I have recommended that he consider a cardiac evaluation with his primary care physician for completeness.   Review of Systems  Constitutional: Negative for fever and unexpected weight change.  HENT: Negative for congestion, dental problem, ear pain, nosebleeds, postnasal drip, rhinorrhea, sinus pressure, sneezing, sore throat and trouble swallowing.   Eyes: Negative for redness and itching.  Respiratory: Negative for cough, chest tightness, shortness of breath and wheezing.   Cardiovascular: Negative for palpitations and leg swelling.  Gastrointestinal: Negative for nausea and vomiting.  Genitourinary: Negative for dysuria.  Musculoskeletal: Negative for joint swelling.  Skin: Negative for rash.  Neurological: Negative for headaches.  Hematological: Does not bruise/bleed easily.  Psychiatric/Behavioral: Negative for dysphoric mood. The patient is not nervous/anxious.        Objective:   Physical Exam Well-developed male in no acute distress Nose without purulence or discharge noted No skin breakdown or pressure necrosis from the C Pap mask Neck without lymphadenopathy or thyromegaly Lower extremities without edema, no cyanosis Alert and oriented, moves all 4 extremities.       Assessment & Plan:

## 2014-06-29 NOTE — Assessment & Plan Note (Signed)
The patient continues to have some dyspnea on exertion, but his wife states that he leads a totally sedentary lifestyle. I have stressed to him the importance of an exercise program 3-4 days a week to see if his breathing will improve. If not, I would consider whether he needs a cardiac evaluation. Will leave that to his primary care physician.

## 2014-07-02 ENCOUNTER — Telehealth: Payer: Self-pay | Admitting: Pulmonary Disease

## 2014-07-02 DIAGNOSIS — G4731 Primary central sleep apnea: Secondary | ICD-10-CM

## 2014-07-02 NOTE — Telephone Encounter (Signed)
LMTCB

## 2014-07-02 NOTE — Telephone Encounter (Signed)
Return call from [place].Hillery Hunter

## 2014-07-03 NOTE — Telephone Encounter (Signed)
Called and spoke to [place]. The order that was placed on 5/13 for a DME switch did not contain why pt is switching and what device they are using. New order placed. Melissa verbalized understanding and denied any further questions or concerns at this time.

## 2014-07-04 ENCOUNTER — Telehealth: Payer: Self-pay | Admitting: Pulmonary Disease

## 2014-07-04 NOTE — Telephone Encounter (Signed)
What a racket!! Make sure pt knows what is going on.  Let him know he can also get his supplies on line for a significant discount from the DME companies.  He should consider this (CPAPr [place], cpap [place], etc. (

## 2014-07-04 NOTE — Telephone Encounter (Signed)
lmtcb X1 for Melissa at [facility]. 

## 2014-07-04 NOTE — Telephone Encounter (Signed)
Brandon Schroeder returned call [phone]

## 2014-07-04 NOTE — Telephone Encounter (Signed)
lmtcb X1 for pt to make him aware.  

## 2014-07-04 NOTE — Telephone Encounter (Signed)
Pt aware of below.  States he will order these supplies online at this time and reach out to [place] as needed.  Nothing further needed at this time.

## 2014-07-04 NOTE — Telephone Encounter (Signed)
Spoke with Melissa at [facility], states that pt is in a 63-month payment period for his cpap through [employer] and cannot switch DME companies until he pays off his unit.    Forwarding to [facility] to make aware.

## 2014-07-31 NOTE — Telephone Encounter (Signed)
error 

## 2014-08-02 ENCOUNTER — Encounter: Payer: Self-pay | Admitting: Adult Health

## 2014-08-02 ENCOUNTER — Ambulatory Visit (INDEPENDENT_AMBULATORY_CARE_PROVIDER_SITE_OTHER): Payer: Medicare HMO | Admitting: Adult Health

## 2014-08-02 VITALS — BP 126/78 | Temp 98.4°F | Ht 75.0 in | Wt 224.1 lb

## 2014-08-02 DIAGNOSIS — K4090 Unilateral inguinal hernia, without obstruction or gangrene, not specified as recurrent: Secondary | ICD-10-CM | POA: Diagnosis not present

## 2014-08-02 NOTE — Progress Notes (Signed)
   Subjective:    Patient ID: Brandon Schroeder, male    DOB: Mar 07, 1945, 69 y.o.   MRN: [id number]  HPI  Patient presents to the office today for a lump in his right groin that he first noticed 4 days ago. Over the week he has started to have increased discomfort, especially when straining.   Denies any rectal pain or blood in stool. No pain in testicles.   Review of Systems  Constitutional: Negative.   Gastrointestinal: Positive for abdominal pain and abdominal distention.  Genitourinary: Negative.   All other systems reviewed and are negative.      Objective:   Physical Exam  Constitutional: He is oriented to person, place, and time. He appears well-developed and well-nourished. No distress.  Genitourinary: Penis normal. No penile tenderness.  Golf ball size lump in right groin. Easily reducible. No bruising noted.   Neurological: He is alert and oriented to person, place, and time.  Skin: Skin is warm and dry. He is not diaphoretic.  Psychiatric: He has a normal mood and affect. His behavior is normal. Judgment and thought content normal.  Nursing note and vitals reviewed.       Assessment & Plan:  1. Unilateral inguinal hernia without obstruction or gangrene, recurrence not specified - Ambulatory referral to General Surgery - Go to ER if hernia is not reducible, if you have severe abdominal pain, if you notice any bruising/  - Do not strain to use bathroom - Use a fiber supplement - Do not lift anything heavy until seen by surgery.

## 2014-08-02 NOTE — Patient Instructions (Addendum)
Someone from general surgery will call you soon to set up an appointment. Please go to the  ER with severe pain or bruising to the abdomen.   Inguinal Hernia, Adult Muscles help keep everything in the body in its proper place. But if a weak spot in the muscles develops, something can poke through. That is called a hernia. When this happens in the lower part of the belly (abdomen), it is called an inguinal hernia. (It takes its name from a part of the body in this region called the inguinal canal.) A weak spot in the wall of muscles lets some fat or part of the small intestine bulge through. An inguinal hernia can develop at any age. Men get them more often than women. CAUSES  In adults, an inguinal hernia develops over time.  It can be triggered by:  Suddenly straining the muscles of the lower abdomen.  Lifting heavy objects.  Straining to have a bowel movement. Difficult bowel movements (constipation) can lead to this.  Constant coughing. This may be caused by smoking or lung disease.  Being overweight.  Being pregnant.  Working at a job that requires long periods of standing or heavy lifting.  Having had an inguinal hernia before. One type can be an emergency situation. It is called a strangulated inguinal hernia. It develops if part of the small intestine slips through the weak spot and cannot get back into the abdomen. The blood supply can be cut off. If that happens, part of the intestine may die. This situation requires emergency surgery. SYMPTOMS  Often, a small inguinal hernia has no symptoms. It is found when a healthcare provider does a physical exam. Larger hernias usually have symptoms.   In adults, symptoms may include:  A lump in the groin. This is easier to see when the person is standing. It might disappear when lying down.  In men, a lump in the scrotum.  Pain or burning in the groin. This occurs especially when lifting, straining or coughing.  A dull ache or  feeling of pressure in the groin.  Signs of a strangulated hernia can include:  A bulge in the groin that becomes very painful and tender to the touch.  A bulge that turns red or purple.  Fever, nausea and vomiting.  Inability to have a bowel movement or to pass gas. DIAGNOSIS  To decide if you have an inguinal hernia, a healthcare provider will probably do a physical examination.  This will include asking questions about any symptoms you have noticed.  The healthcare provider might feel the groin area and ask you to cough. If an inguinal hernia is felt, the healthcare provider may try to slide it back into the abdomen.  Usually no other tests are needed. TREATMENT  Treatments can vary. The size of the hernia makes a difference. Options include:  Watchful waiting. This is often suggested if the hernia is small and you have had no symptoms.  No medical procedure will be done unless symptoms develop.  You will need to watch closely for symptoms. If any occur, contact your healthcare provider right away.  Surgery. This is used if the hernia is larger or you have symptoms.  Open surgery. This is usually an outpatient procedure (you will not stay overnight in a hospital). An cut (incision) is made through the skin in the groin. The hernia is put back inside the abdomen. The weak area in the muscles is then repaired by herniorrhaphy or hernioplasty. Herniorrhaphy: in  this type of surgery, the weak muscles are sewn back together. Hernioplasty: a patch or mesh is used to close the weak area in the abdominal wall.  Laparoscopy. In this procedure, a surgeon makes small incisions. A thin tube with a tiny video camera (called a laparoscope) is put into the abdomen. The surgeon repairs the hernia with mesh by looking with the video camera and using two long instruments. HOME CARE INSTRUCTIONS   After surgery to repair an inguinal hernia:  You will need to take pain medicine prescribed by your  healthcare provider. Follow all directions carefully.  You will need to take care of the wound from the incision.  Your activity will be restricted for awhile. This will probably include no heavy lifting for several weeks. You also should not do anything too active for a few weeks. When you can return to work will depend on the type of job that you have.  During "watchful waiting" periods, you should:  Maintain a healthy weight.  Eat a diet high in fiber (fruits, vegetables and whole grains).  Drink plenty of fluids to avoid constipation. This means drinking enough water and other liquids to keep your urine clear or pale yellow.  Do not lift heavy objects.  Do not stand for long periods of time.  Quit smoking. This should keep you from developing a frequent cough. SEEK MEDICAL CARE IF:   A bulge develops in your groin area.  You feel pain, a burning sensation or pressure in the groin. This might be worse if you are lifting or straining.  You develop a fever of more than 100.5 F (38.1 C). SEEK IMMEDIATE MEDICAL CARE IF:   Pain in the groin increases suddenly.  A bulge in the groin gets bigger suddenly and does not go down.  For men, there is sudden pain in the scrotum. Or, the size of the scrotum increases.  A bulge in the groin area becomes red or purple and is painful to touch.  You have nausea or vomiting that does not go away.  You feel your heart beating much faster than normal.  You cannot have a bowel movement or pass gas.  You develop a fever of more than 102.0 F (38.9 C). Document Released: 06/21/2008 Document Revised: 04/27/2011 Document Reviewed: 06/21/2008 [facility] Patient Information 2015 [place], [place]. This information is not intended to replace advice given to you by your health care provider. Make sure you discuss any questions you have with your health care provider.

## 2014-08-02 NOTE — Progress Notes (Signed)
Pre visit review using our clinic review tool, if applicable. No additional management support is needed unless otherwise documented below in the visit note. 

## 2014-08-07 ENCOUNTER — Ambulatory Visit (INDEPENDENT_AMBULATORY_CARE_PROVIDER_SITE_OTHER): Payer: Medicare HMO | Admitting: Family Medicine

## 2014-08-07 ENCOUNTER — Telehealth: Payer: Self-pay | Admitting: Family Medicine

## 2014-08-07 ENCOUNTER — Encounter: Payer: Self-pay | Admitting: Family Medicine

## 2014-08-07 VITALS — BP 128/88 | HR 76 | Temp 98.3°F | Ht 75.0 in | Wt 220.0 lb

## 2014-08-07 DIAGNOSIS — K4090 Unilateral inguinal hernia, without obstruction or gangrene, not specified as recurrent: Secondary | ICD-10-CM | POA: Diagnosis not present

## 2014-08-07 MED ORDER — ONDANSETRON HCL 8 MG PO TABS: 8.0000 mg | ORAL_TABLET | Freq: Three times a day (TID) | ORAL | Status: DC | PRN

## 2014-08-07 NOTE — Progress Notes (Signed)
   Subjective:    Patient ID: Brandon Schroeder, male    DOB: 31-Jul-1945, 69 y.o.   MRN: [id number]  HPI Here to follow up on an inguinal hernia tat appeared about 2 weeks ago. He was seen here last week and the hernia was somewhat painful and it was reducible. Since then it has grown larger and it is more painful. He is also very nauseated much of the time. No fever. He was scheduled to see [facility] on 08-21-14 but he does not feel he can wait that long.   Review of Systems  Constitutional: Negative.   Gastrointestinal: Positive for nausea and abdominal pain. Negative for vomiting, blood in stool and abdominal distention.       Objective:   Physical Exam  Constitutional: He appears well-developed and well-nourished.  Cardiovascular: Normal rate, regular rhythm, normal heart sounds and intact distal pulses.   Pulmonary/Chest: Effort normal and breath sounds normal.  Abdominal: Soft. Bowel sounds are normal. He exhibits no distension. There is no rebound and no guarding.  Large tender non-reducible right direct inguinal hernia          Assessment & Plan:  His hernia is larger, more painful and is now non-reducible. We will arrange for Surgery to see him sooner.

## 2014-08-07 NOTE — Progress Notes (Signed)
Pre visit review using our clinic review tool, if applicable. No additional management support is needed unless otherwise documented below in the visit note. 

## 2014-08-07 NOTE — Telephone Encounter (Signed)
[facility] - Client [place] [facility] Patient Name: TSUNEO FAISON DOB: 07/27/1945 Initial Comment caller states her husband was dx with a hernia last week - his sx are getting worse and he is in pain - cannot sleep or eat - wife states he needs to be reevaluated Nurse Assessment Nurse: Erlene Quan, RN, Manuela Schwartz Date/Time Eilene Ghazi Time): 08/07/2014 10:39:42 AM Confirm and document reason for call. If symptomatic, describe symptoms. ---caller states her husband was dx with a hernia last week - his sx are getting worse and he is in pain - cannot sleep or eat - wife states he needs to be re- evaluated - they can not see a surgeon until July 5th and she really would like to see Dr Sarajane Jews if possible to see what they can do to get this moved along , she is afraid he can not wait till the 5th of July and then who knows how long before the surgery will be scheduled Has the patient traveled out of the country within the last 30 days? ---Not Applicable Does the patient require triage? ---Declined Triage Guidelines Guideline Title Affirmed Question Affirmed Notes Final Disposition User Comments appointment set for today at 2 pm with Dr Sarajane Jews

## 2014-08-13 ENCOUNTER — Other Ambulatory Visit: Payer: Self-pay

## 2014-08-13 ENCOUNTER — Encounter (HOSPITAL_COMMUNITY): Payer: Self-pay | Admitting: *Deleted

## 2014-08-13 NOTE — H&P (Signed)
History of Present Illness Ralene Ok MD; 08/09/2014 9:49 AM) Patient words: Inguinal Hernia.  The patient is a 69 year old male who presents with an inguinal hernia. 69 year old male who is referred by Dr. Sarajane Jews for evaluation of a right inguinal hernia. The patient states she's had this there for several weeks. He states there is been more symptomatic with the last several weeks. He states it minimally reducible. The patient works as an [name].   Other Problems Erasmo Leventhal, RN, BSN; 08/09/2014 9:31 AM) Anxiety Disorder Arthritis Back Pain Depression Enlarged Prostate Gastroesophageal Reflux Disease High blood pressure Inguinal Hernia Kidney Stone Sleep Apnea  Past Surgical History Erasmo Leventhal, RN, BSN; 08/09/2014 9:31 AM) Cataract Surgery Bilateral. Nephrectomy Right.  Diagnostic Studies History Erasmo Leventhal, RN, BSN; 08/09/2014 9:31 AM) Colonoscopy 1-5 years ago  Allergies Erasmo Leventhal, RN, BSN; 08/09/2014 9:33 AM) Penicillamine *ASSORTED CLASSES*  Medication History Erasmo Leventhal, RN, BSN; 08/09/2014 9:38 AM) Amlodipine-Atorvastatin (5-10MG  Tablet, Oral daily) Active. Zoloft (100MG  Tablet, Oral daily) Active. Temazepam (30MG  Capsule, Oral as needed) Active. Medications Reconciled  Review of Systems Occupational hygienist, BSN; 08/09/2014 9:31 AM) General Present- Fatigue and Weight Loss. Not Present- Appetite Loss, Chills, Fever, Night Sweats and Weight Gain. Skin Present- Dryness. Not Present- Change in Wart/Mole, Hives, Jaundice, New Lesions, Non-Healing Wounds, Rash and Ulcer. HEENT Present- Earache, Hearing Loss, Ringing in the Ears, Seasonal Allergies, Sinus Pain, Sore Throat and Wears glasses/contact lenses. Not Present- Hoarseness, Nose Bleed, Oral Ulcers, Visual Disturbances and Yellow Eyes. Respiratory Present- Snoring. Not Present- Bloody sputum, Chronic Cough, Difficulty Breathing and Wheezing. Breast Not Present- Breast  Mass, Breast Pain, Nipple Discharge and Skin Changes. Cardiovascular Present- Leg Cramps, Shortness of Breath and Swelling of Extremities. Not Present- Chest Pain, Difficulty Breathing Lying Down, Palpitations and Rapid Heart Rate. Gastrointestinal Present- Difficulty Swallowing, Indigestion and Nausea. Not Present- Abdominal Pain, Bloating, Bloody Stool, Change in Bowel Habits, Chronic diarrhea, Constipation, Excessive gas, Gets full quickly at meals, Hemorrhoids, Rectal Pain and Vomiting. Male Genitourinary Present- Blood in Urine, Frequency, Nocturia and Urgency. Not Present- Change in Urinary Stream, Impotence, Painful Urination and Urine Leakage. Musculoskeletal Present- Back Pain, Joint Pain, Joint Stiffness, Muscle Weakness and Swelling of Extremities. Not Present- Muscle Pain. Neurological Present- Decreased Memory, Headaches, Tingling and Tremor. Not Present- Fainting, Numbness, Seizures, Trouble walking and Weakness. Psychiatric Present- Anxiety and Depression. Not Present- Bipolar, Change in Sleep Pattern, Fearful and Frequent crying. Endocrine Present- Heat Intolerance. Not Present- Cold Intolerance, Excessive Hunger, Hair Changes, Hot flashes and New Diabetes. Hematology Present- Easy Bruising and Excessive bleeding. Not Present- Gland problems, HIV and Persistent Infections.   Vitals Occupational hygienist, BSN; 08/09/2014 9:32 AM) 08/09/2014 9:32 AM Weight: 222 lb Height: 74in Body Surface Area: 2.29 m Body Mass Index: 28.5 kg/m Temp.: 98.68F(Oral)  Pulse: 82 (Regular)  BP: 138/90 (Sitting, Left Arm, Standard)    Physical Exam Ralene Ok MD; 08/09/2014 9:49 AM) General Mental Status-Alert. General Appearance-Consistent with stated age. Hydration-Well hydrated. Voice-Normal.  Head and Neck Head-normocephalic, atraumatic with no lesions or palpable masses. Trachea-midline. Thyroid Gland Characteristics - normal size and consistency.  Chest and  Lung Exam Chest and lung exam reveals -quiet, even and easy respiratory effort with no use of accessory muscles and on auscultation, normal breath sounds, no adventitious sounds and normal vocal resonance. Inspection Chest Wall - Normal. Back - normal.  Cardiovascular Cardiovascular examination reveals -normal heart sounds, regular rate and rhythm with no murmurs and normal pedal pulses bilaterally.  Abdomen Inspection Skin - Scar -  no surgical scars. Hernias - Inguinal hernia - Right - Reducible(Moderate-sized). Palpation/Percussion Normal exam - Soft, Non Tender, No Rebound tenderness, No Rigidity (guarding) and No hepatosplenomegaly. Auscultation Normal exam - Bowel sounds normal.    Assessment & Plan Ralene Ok MD; 08/09/2014 9:51 AM) RIGHT INGUINAL HERNIA (550.90  K40.90) Impression: 69 year old male with a right inguinal hernia  1. The patient would like to proceed to the operating room for laparoscopic right inguinal hernia repair with mesh. 2. All risks and benefits were discussed with the patient to generally include, but not limited to: infection, bleeding, damage to surrounding structures, acute and chronic nerve pain, and recurrence. Alternatives were offered and described. All questions were answered and the patient voiced understanding of the procedure and wishes to proceed at this point with hernia repair.

## 2014-08-14 ENCOUNTER — Encounter (HOSPITAL_COMMUNITY)
Admission: RE | Disposition: A | Discharge status: Home / Self Care | Payer: Self-pay | Source: Ambulatory Visit | Attending: General Surgery

## 2014-08-14 ENCOUNTER — Ambulatory Visit (HOSPITAL_COMMUNITY): Payer: Medicare HMO | Admitting: Certified Registered Nurse Anesthetist

## 2014-08-14 ENCOUNTER — Encounter (HOSPITAL_COMMUNITY): Payer: Self-pay | Admitting: Certified Registered Nurse Anesthetist

## 2014-08-14 ENCOUNTER — Ambulatory Visit (HOSPITAL_COMMUNITY)
Admission: RE | Admit: 2014-08-14 | Discharge: 2014-08-14 | Disposition: A | Discharge status: Home / Self Care | Payer: Medicare HMO | Source: Ambulatory Visit | Attending: General Surgery | Admitting: General Surgery

## 2014-08-14 DIAGNOSIS — I10 Essential (primary) hypertension: Secondary | ICD-10-CM | POA: Diagnosis not present

## 2014-08-14 DIAGNOSIS — K219 Gastro-esophageal reflux disease without esophagitis: Secondary | ICD-10-CM | POA: Insufficient documentation

## 2014-08-14 DIAGNOSIS — N400 Enlarged prostate without lower urinary tract symptoms: Secondary | ICD-10-CM | POA: Insufficient documentation

## 2014-08-14 DIAGNOSIS — M1990 Unspecified osteoarthritis, unspecified site: Secondary | ICD-10-CM | POA: Insufficient documentation

## 2014-08-14 DIAGNOSIS — Z9989 Dependence on other enabling machines and devices: Secondary | ICD-10-CM | POA: Diagnosis not present

## 2014-08-14 DIAGNOSIS — K4090 Unilateral inguinal hernia, without obstruction or gangrene, not specified as recurrent: Secondary | ICD-10-CM | POA: Diagnosis present

## 2014-08-14 DIAGNOSIS — G4730 Sleep apnea, unspecified: Secondary | ICD-10-CM | POA: Insufficient documentation

## 2014-08-14 DIAGNOSIS — F419 Anxiety disorder, unspecified: Secondary | ICD-10-CM | POA: Insufficient documentation

## 2014-08-14 DIAGNOSIS — F329 Major depressive disorder, single episode, unspecified: Secondary | ICD-10-CM | POA: Insufficient documentation

## 2014-08-14 DIAGNOSIS — Z79899 Other long term (current) drug therapy: Secondary | ICD-10-CM | POA: Insufficient documentation

## 2014-08-14 HISTORY — DX: Anxiety disorder, unspecified: F41.9

## 2014-08-14 HISTORY — PX: INSERTION OF MESH: SHX5868

## 2014-08-14 HISTORY — DX: Personal history of urinary calculi: Z87.442

## 2014-08-14 HISTORY — PX: INGUINAL HERNIA REPAIR: SHX194

## 2014-08-14 HISTORY — DX: Reserved for inherently not codable concepts without codable children: IMO0001

## 2014-08-14 LAB — POCT I-STAT 4, (NA,K, GLUC, HGB,HCT)
GLUCOSE: 115 mg/dL — AB (ref 65–99)
HEMATOCRIT: 41 % (ref 39.0–52.0)
Hemoglobin: 13.9 g/dL (ref 13.0–17.0)
Potassium: 3.2 mmol/L — ABNORMAL LOW (ref 3.5–5.1)
Sodium: 140 mmol/L (ref 135–145)

## 2014-08-14 SURGERY — REPAIR, HERNIA, INGUINAL, LAPAROSCOPIC
Anesthesia: General | Site: Groin | Laterality: Right

## 2014-08-14 MED ORDER — HYDROMORPHONE HCL 1 MG/ML IJ SOLN
INTRAMUSCULAR | Status: AC
  Filled 2014-08-14: qty 1

## 2014-08-14 MED ORDER — PROPOFOL 10 MG/ML IV BOLUS
INTRAVENOUS | Status: DC | PRN
  Administered 2014-08-14: 150 mg via INTRAVENOUS

## 2014-08-14 MED ORDER — MIDAZOLAM HCL 5 MG/5ML IJ SOLN
INTRAMUSCULAR | Status: DC | PRN
  Administered 2014-08-14: 2 mg via INTRAVENOUS

## 2014-08-14 MED ORDER — FENTANYL CITRATE (PF) 250 MCG/5ML IJ SOLN
INTRAMUSCULAR | Status: AC
  Filled 2014-08-14: qty 5

## 2014-08-14 MED ORDER — ONDANSETRON HCL 4 MG/2ML IJ SOLN
INTRAMUSCULAR | Status: AC
  Filled 2014-08-14: qty 2

## 2014-08-14 MED ORDER — ARTIFICIAL TEARS OP OINT
TOPICAL_OINTMENT | OPHTHALMIC | Status: AC
  Filled 2014-08-14: qty 3.5

## 2014-08-14 MED ORDER — ACETAMINOPHEN 325 MG PO TABS: 650.0000 mg | ORAL_TABLET | ORAL | Status: DC | PRN

## 2014-08-14 MED ORDER — LACTATED RINGERS IV SOLN
INTRAVENOUS | Status: DC
  Administered 2014-08-14 (×2): via INTRAVENOUS

## 2014-08-14 MED ORDER — SUGAMMADEX SODIUM 200 MG/2ML IV SOLN
INTRAVENOUS | Status: AC
  Filled 2014-08-14: qty 2

## 2014-08-14 MED ORDER — FENTANYL CITRATE (PF) 100 MCG/2ML IJ SOLN
INTRAMUSCULAR | Status: DC | PRN
  Administered 2014-08-14 (×3): 50 ug via INTRAVENOUS
  Administered 2014-08-14: 100 ug via INTRAVENOUS

## 2014-08-14 MED ORDER — 0.9 % SODIUM CHLORIDE (POUR BTL) OPTIME
TOPICAL | Status: DC | PRN
  Administered 2014-08-14: 1000 mL

## 2014-08-14 MED ORDER — SODIUM CHLORIDE 0.9 % IJ SOLN: 3.0000 mL | Freq: Two times a day (BID) | INTRAMUSCULAR | Status: DC

## 2014-08-14 MED ORDER — BUPIVACAINE HCL (PF) 0.25 % IJ SOLN
INTRAMUSCULAR | Status: AC
  Filled 2014-08-14: qty 30

## 2014-08-14 MED ORDER — BUPIVACAINE HCL 0.25 % IJ SOLN
INTRAMUSCULAR | Status: DC | PRN
  Administered 2014-08-14: 5 mL

## 2014-08-14 MED ORDER — ROCURONIUM BROMIDE 100 MG/10ML IV SOLN
INTRAVENOUS | Status: DC | PRN
  Administered 2014-08-14: 50 mg via INTRAVENOUS

## 2014-08-14 MED ORDER — MIDAZOLAM HCL 2 MG/2ML IJ SOLN
INTRAMUSCULAR | Status: AC
  Filled 2014-08-14: qty 2

## 2014-08-14 MED ORDER — LIDOCAINE HCL (CARDIAC) 20 MG/ML IV SOLN
INTRAVENOUS | Status: DC | PRN
  Administered 2014-08-14: 60 mg via INTRATRACHEAL
  Administered 2014-08-14: 40 mg via INTRAVENOUS

## 2014-08-14 MED ORDER — ROCURONIUM BROMIDE 50 MG/5ML IV SOLN
INTRAVENOUS | Status: AC
  Filled 2014-08-14: qty 1

## 2014-08-14 MED ORDER — SODIUM CHLORIDE 0.9 % IJ SOLN: 3.0000 mL | INTRAMUSCULAR | Status: DC | PRN

## 2014-08-14 MED ORDER — OXYCODONE HCL 5 MG PO TABS
ORAL_TABLET | ORAL | Status: AC
  Filled 2014-08-14: qty 2

## 2014-08-14 MED ORDER — OXYCODONE HCL 5 MG PO TABS
5.0000 mg | ORAL_TABLET | ORAL | Status: DC | PRN
  Administered 2014-08-14: 10 mg via ORAL

## 2014-08-14 MED ORDER — LIDOCAINE HCL (CARDIAC) 20 MG/ML IV SOLN
INTRAVENOUS | Status: AC
  Filled 2014-08-14: qty 10

## 2014-08-14 MED ORDER — OXYCODONE-ACETAMINOPHEN 5-325 MG PO TABS: 1.0000 | ORAL_TABLET | ORAL | Status: DC | PRN

## 2014-08-14 MED ORDER — ACETAMINOPHEN 650 MG RE SUPP: 650.0000 mg | RECTAL | Status: DC | PRN

## 2014-08-14 MED ORDER — PROPOFOL 10 MG/ML IV BOLUS
INTRAVENOUS | Status: AC
  Filled 2014-08-14: qty 20

## 2014-08-14 MED ORDER — ONDANSETRON HCL 4 MG/2ML IJ SOLN
INTRAMUSCULAR | Status: DC | PRN
  Administered 2014-08-14: 4 mg via INTRAVENOUS

## 2014-08-14 MED ORDER — SODIUM CHLORIDE 0.9 % IV SOLN: 250.0000 mL | INTRAVENOUS | Status: DC | PRN

## 2014-08-14 MED ORDER — VANCOMYCIN HCL IN DEXTROSE 1-5 GM/200ML-% IV SOLN
INTRAVENOUS | Status: AC
  Administered 2014-08-14: 1000 mg via INTRAVENOUS
  Filled 2014-08-14: qty 200

## 2014-08-14 MED ORDER — DEXAMETHASONE SODIUM PHOSPHATE 4 MG/ML IJ SOLN
INTRAMUSCULAR | Status: DC | PRN
  Administered 2014-08-14: 4 mg via INTRAVENOUS

## 2014-08-14 MED ORDER — SUGAMMADEX SODIUM 200 MG/2ML IV SOLN
INTRAVENOUS | Status: DC | PRN
  Administered 2014-08-14: 200 mg via INTRAVENOUS

## 2014-08-14 MED ORDER — HYDROMORPHONE HCL 1 MG/ML IJ SOLN
0.2500 mg | INTRAMUSCULAR | Status: DC | PRN
  Administered 2014-08-14 (×2): 0.5 mg via INTRAVENOUS
  Administered 2014-08-14: 1 mg via INTRAVENOUS

## 2014-08-14 SURGICAL SUPPLY — 56 items
APL SKNCLS STERI-STRIP NONHPOA (GAUZE/BANDAGES/DRESSINGS) ×2
APPLIER CLIP 5 13 M/L LIGAMAX5 (MISCELLANEOUS)
APR CLP MED LRG 5 ANG JAW (MISCELLANEOUS)
BENZOIN TINCTURE PRP APPL 2/3 (GAUZE/BANDAGES/DRESSINGS) ×4 IMPLANT
CANISTER SUCTION 2500CC (MISCELLANEOUS) IMPLANT
CHLORAPREP W/TINT 26ML (MISCELLANEOUS) ×4 IMPLANT
CLIP APPLIE 5 13 M/L LIGAMAX5 (MISCELLANEOUS) IMPLANT
CLOSURE WOUND 1/2 X4 (GAUZE/BANDAGES/DRESSINGS) ×1
COVER SURGICAL LIGHT HANDLE (MISCELLANEOUS) ×4 IMPLANT
DISSECTOR BLUNT TIP ENDO 5MM (MISCELLANEOUS) IMPLANT
DRAPE LAPAROSCOPIC ABDOMINAL (DRAPES) ×4 IMPLANT
ELECT REM PT RETURN 9FT ADLT (ELECTROSURGICAL) ×4
ELECTRODE REM PT RTRN 9FT ADLT (ELECTROSURGICAL) ×2 IMPLANT
ENDOLOOP SUT PDS II  0 18 (SUTURE) ×2
ENDOLOOP SUT PDS II 0 18 (SUTURE) IMPLANT
GAUZE SPONGE 2X2 8PLY STRL LF (GAUZE/BANDAGES/DRESSINGS) ×2 IMPLANT
GLOVE BIO SURGEON STRL SZ 6.5 (GLOVE) ×1 IMPLANT
GLOVE BIO SURGEON STRL SZ7 (GLOVE) ×2 IMPLANT
GLOVE BIO SURGEON STRL SZ7.5 (GLOVE) ×4 IMPLANT
GLOVE BIO SURGEONS STRL SZ 6.5 (GLOVE) ×1
GLOVE BIOGEL PI IND STRL 7.0 (GLOVE) IMPLANT
GLOVE BIOGEL PI IND STRL 7.5 (GLOVE) IMPLANT
GLOVE BIOGEL PI INDICATOR 7.0 (GLOVE) ×4
GLOVE BIOGEL PI INDICATOR 7.5 (GLOVE) ×2
GLOVE ECLIPSE 7.5 STRL STRAW (GLOVE) ×2 IMPLANT
GOWN STRL REUS W/ TWL LRG LVL3 (GOWN DISPOSABLE) ×4 IMPLANT
GOWN STRL REUS W/ TWL XL LVL3 (GOWN DISPOSABLE) ×2 IMPLANT
GOWN STRL REUS W/TWL LRG LVL3 (GOWN DISPOSABLE) ×8
GOWN STRL REUS W/TWL XL LVL3 (GOWN DISPOSABLE) ×8
KIT BASIN OR (CUSTOM PROCEDURE TRAY) ×4 IMPLANT
KIT ROOM TURNOVER OR (KITS) ×4 IMPLANT
MESH 3DMAX 4X6 RT LRG (Mesh General) ×2 IMPLANT
NDL INSUFFLATION 14GA 120MM (NEEDLE) IMPLANT
NEEDLE INSUFFLATION 14GA 120MM (NEEDLE) IMPLANT
NS IRRIG 1000ML POUR BTL (IV SOLUTION) ×4 IMPLANT
PAD ARMBOARD 7.5X6 YLW CONV (MISCELLANEOUS) ×8 IMPLANT
RELOAD STAPLE 4.0 BLU F/HERNIA (INSTRUMENTS) ×2 IMPLANT
RELOAD STAPLE 4.8 BLK F/HERNIA (STAPLE) IMPLANT
RELOAD STAPLE HERNIA 4.0 BLUE (INSTRUMENTS) ×4 IMPLANT
RELOAD STAPLE HERNIA 4.8 BLK (STAPLE) IMPLANT
SCISSORS LAP 5X35 DISP (ENDOMECHANICALS) ×4 IMPLANT
SET IRRIG TUBING LAPAROSCOPIC (IRRIGATION / IRRIGATOR) IMPLANT
SET TROCAR LAP APPLE-HUNT 5MM (ENDOMECHANICALS) ×4 IMPLANT
SPONGE GAUZE 2X2 STER 10/PKG (GAUZE/BANDAGES/DRESSINGS) ×2
STAPLER HERNIA 12 8.5 360D (INSTRUMENTS) ×4 IMPLANT
STRIP CLOSURE SKIN 1/2X4 (GAUZE/BANDAGES/DRESSINGS) ×3 IMPLANT
SUT MNCRL AB 4-0 PS2 18 (SUTURE) ×6 IMPLANT
SUT VIC AB 1 CT1 27 (SUTURE)
SUT VIC AB 1 CT1 27XBRD ANBCTR (SUTURE) IMPLANT
TAPE CLOTH SURG 4X10 WHT LF (GAUZE/BANDAGES/DRESSINGS) ×2 IMPLANT
TOWEL OR 17X24 6PK STRL BLUE (TOWEL DISPOSABLE) ×4 IMPLANT
TOWEL OR 17X26 10 PK STRL BLUE (TOWEL DISPOSABLE) ×4 IMPLANT
TRAY FOLEY CATH 16FR SILVER (SET/KITS/TRAYS/PACK) ×4 IMPLANT
TRAY LAPAROSCOPIC (CUSTOM PROCEDURE TRAY) ×4 IMPLANT
TROCAR XCEL 12X100 BLDLESS (ENDOMECHANICALS) ×4 IMPLANT
TUBING INSUFFLATION (TUBING) ×4 IMPLANT

## 2014-08-14 NOTE — Discharge Instructions (Signed)
[facility] ______[facility] °LAPAROSCOPIC SURGERY: POST OP INSTRUCTIONS °Always review your discharge instruction sheet given to you by the facility where your surgery was performed. °IF YOU HAVE DISABILITY OR FAMILY LEAVE FORMS, YOU MUST BRING THEM TO THE OFFICE FOR PROCESSING.   °DO NOT GIVE THEM TO YOUR DOCTOR. ° °1. A prescription for pain medication may be given to you upon discharge.  Take your pain medication as prescribed, if needed.  If narcotic pain medicine is not needed, then you may take acetaminophen (Tylenol) or ibuprofen (Advil) as needed. °2. Take your usually prescribed medications unless otherwise directed. °3. If you need a refill on your pain medication, please contact your pharmacy.  They will contact our office to request authorization. Prescriptions will not be filled after 5pm or on week-ends. °4. You should follow a light diet the first few days after arrival home, such as soup and crackers, etc.  Be sure to include lots of fluids daily. °5. Most patients will experience some swelling and bruising in the area of the incisions.  Ice packs will help.  Swelling and bruising can take several days to resolve.  °6. It is common to experience some constipation if taking pain medication after surgery.  Increasing fluid intake and taking a stool softener (such as Colace) will usually help or prevent this problem from occurring.  A mild laxative (Milk of Magnesia or Miralax) should be taken according to package instructions if there are no bowel movements after 48 hours. °7. Unless discharge instructions indicate otherwise, you may remove your bandages 24-48 hours after surgery, and you may shower at that time.  You may have steri-strips (small skin tapes) in place directly over the incision.  These strips should be left on the skin for 7-10 days.  If your surgeon used skin glue on the incision, you may shower in 24 hours.  The glue will flake off over the next 2-3 weeks.  Any sutures or  staples will be removed at the office during your follow-up visit. °8. ACTIVITIES:  You may resume regular (light) daily activities beginning the next day--such as daily self-care, walking, climbing stairs--gradually increasing activities as tolerated.  You may have sexual intercourse when it is comfortable.  Refrain from any heavy lifting or straining until approved by your doctor. °a. You may drive when you are no longer taking prescription pain medication, you can comfortably wear a seatbelt, and you can safely maneuver your car and apply brakes. °b. RETURN TO WORK:  __________________________________________________________ °9. You should see your doctor in the office for a follow-up appointment approximately 2-3 weeks after your surgery.  Make sure that you call for this appointment within a day or two after you arrive home to insure a convenient appointment time. °10. OTHER INSTRUCTIONS: __________________________________________________________________________________________________________________________ __________________________________________________________________________________________________________________________ °WHEN TO CALL YOUR DOCTOR: °1. Fever over 101.0 °2. Inability to urinate °3. Continued bleeding from incision. °4. Increased pain, redness, or drainage from the incision. °5. Increasing abdominal pain ° °The clinic staff is available to answer your questions during regular business hours.  Please don’t hesitate to call and ask to speak to one of the nurses for clinical concerns.  If you have a medical emergency, go to the nearest emergency room or call 911.  A surgeon from [facility] is always on call at the hospital. °[address], [address], [place], [place]  [address] ? [address] [address], [place],    [address] °[phone] ? [phone] ? FAX [phone] °Web site:   [email or web address] °

## 2014-08-14 NOTE — Anesthesia Procedure Notes (Signed)
Procedure Name: Intubation Date/Time: 08/14/2014 12:57 PM Performed by: Maryland Pink Pre-anesthesia Checklist: Patient identified, Patient being monitored, Emergency Drugs available, Suction available and Timeout performed Patient Re-evaluated:Patient Re-evaluated prior to inductionOxygen Delivery Method: Circle system utilized Preoxygenation: Pre-oxygenation with 100% oxygen Intubation Type: IV induction Ventilation: Mask ventilation without difficulty Laryngoscope Size: Mac and 4 Grade View: Grade III Tube type: Oral Tube size: 7.5 mm Number of attempts: 1 Airway Equipment and Method: Stylet and LTA kit utilized Placement Confirmation: ETT inserted through vocal cords under direct vision,  positive ETCO2 and breath sounds checked- equal and bilateral Secured at: 21 cm Tube secured with: Tape Dental Injury: Teeth and Oropharynx as per pre-operative assessment

## 2014-08-14 NOTE — Op Note (Signed)
08/14/2014  1:43 PM  PATIENT:  Brandon Schroeder  69 y.o. male  PRE-OPERATIVE DIAGNOSIS:  RIGHT INGUINAL HERNIA  POST-OPERATIVE DIAGNOSIS:  RIGHT INDIRECT INGUINAL HERNIA  PROCEDURE:  Procedure(s): LAPAROSCOPIC RIGHT INGUINAL HERNIA REPAIR WITH MESH (Right) INSERTION OF MESH (Right)  SURGEON:  Surgeon(s) and Role:    * Ralene Ok, MD - Primary  ASSISTANTS: Jacqulyn Ducking, RN   ANESTHESIA:   local and general  EBL:<5cc   Total I/O In: 1000 [I.V.:1000] Out: -   BLOOD ADMINISTERED:none  DRAINS: none   LOCAL MEDICATIONS USED:  BUPIVICAINE   SPECIMEN:  No Specimen  DISPOSITION OF SPECIMEN:  N/A  COUNTS:  YES  TOURNIQUET:  * No tourniquets in log *  DICTATION: .Dragon Dictation Counts: reported as correct x 2  Findings:  The patient had a small right indirect hernia  Indications for procedure:  The patient is a 69 year old male with a right hernia for several months. Patient complained of symptomatology to his right inguinal area. The patient was taken back for elective inguinal hernia repair.  Details of the procedure: The patient was taken back to the operating room. The patient was placed in supine position with bilateral SCDs in place.  The patient was prepped and draped in the usual sterile fashion.  After appropriate anitbiotics were confirmed, a time-out was confirmed and all facts were verified.  0.25% Marcaine was used to infiltrate the umbilical area. A 11-blade was used to cut down the skin and blunt dissection was used to get the anterior fashion.  The anterior fascia was incised approximately 1 cm and the muscles were retracted laterally. Blunt dissection was then used to create a space in the preperitoneal area. At this time a 10 mm camera was then introduced into the space and advanced the pubic tubercle and a 12 mm trocar was placed over this and insufflation was started.  At this time and space was created from medial to laterally the preperitoneal space.   Cooper's ligament was initially cleaned off.  The hernia sac was identified in the indirect space. Dissection of the hernia sac was undertaken the vas deferens was identified and protected in all parts of the case.  There was a small tear into the hernia sac. A Veress needle right upper quadrant to help evacuate the intraperitoneal air.  The peritoneum was reapproximated with an Endoloop.  Once the hernia sac was taken down to approximately the umbilicus a Bard 3D Max mesh, size: Large, was  introduced into the preperitoneal space.  The mesh was brought over to cover the direct and indirect hernia spaces.  This was anchored into place and secured to Cooper's ligament with 4.98mm staples from a Coviden hernia stapler. It was anchored to the anterior abdominal wall with 4.8 mm staples. The hernia sac was seen lying posterior to the mesh. There was no staples placed laterally. The insufflation was evacuated and the peritoneum was seen posterior to the mesh. The trochars were removed. The anterior fascia was reapproximated using #1 Vicryl on a UR- 6.  Intra-abdominal air was evacuated and the Veress needle removed. The skin was reapproximated using 4-0 Monocryl subcuticular fashion the patient was awakened from general anesthesia and taken to recovery in stable condition.   PLAN OF CARE: Discharge to home after PACU  PATIENT DISPOSITION:  PACU - hemodynamically stable.   Delay start of Pharmacological VTE agent (>24hrs) due to surgical blood loss or risk of bleeding: not applicable

## 2014-08-14 NOTE — Transfer of Care (Signed)
Immediate Anesthesia Transfer of Care Note  [name]  Procedure(s) Performed: Procedure(s): LAPAROSCOPIC RIGHT INGUINAL HERNIA REPAIR WITH MESH (Right) INSERTION OF MESH (Right)  Patient Location: PACU  Anesthesia Type:General  Level of Consciousness: awake, alert  and oriented  Airway & Oxygen Therapy: Patient Spontanous Breathing and Patient connected to nasal cannula oxygen  Post-op Assessment: Report given to RN and Post -op Vital signs reviewed and stable  Post vital signs: Reviewed and stable  Last Vitals:  Filed Vitals:   08/14/14 1201  BP: 175/104  Pulse: 80  Temp: 36.9 C  Resp: 18    Complications: No apparent anesthesia complications

## 2014-08-14 NOTE — Anesthesia Postprocedure Evaluation (Signed)
Anesthesia Post Note  [name]  Procedure(s) Performed: Procedure(s) (LRB): LAPAROSCOPIC RIGHT INGUINAL HERNIA REPAIR WITH MESH (Right) INSERTION OF MESH (Right)  Anesthesia type: general  Patient location: PACU  Post pain: Pain level controlled  Post assessment: Patient's Cardiovascular Status Stable  Last Vitals:  Filed Vitals:   08/14/14 1357  BP:   Pulse:   Temp: 36.5 C  Resp: 16    Post vital signs: Reviewed and stable  Level of consciousness: sedated  Complications: No apparent anesthesia complications

## 2014-08-14 NOTE — Anesthesia Preprocedure Evaluation (Addendum)
Anesthesia Evaluation  Patient identified by MRN, date of birth, ID band Patient awake    Reviewed: Allergy & Precautions, H&P , NPO status , Patient's Chart, lab work & pertinent test results  Airway Mallampati: II  TM Distance: >3 FB Neck ROM: Full    Dental no notable dental hx. (+) Teeth Intact, Dental Advisory Given   Pulmonary sleep apnea and Continuous Positive Airway Pressure Ventilation ,  breath sounds clear to auscultation  Pulmonary exam normal       Cardiovascular hypertension, Pt. on medications + DOE Rhythm:Regular Rate:Normal     Neuro/Psych Anxiety Depression negative neurological ROS     GI/Hepatic negative GI ROS, Neg liver ROS, Controlled,  Endo/Other  negative endocrine ROS  Renal/GU negative Renal ROS  negative genitourinary   Musculoskeletal  (+) Arthritis -, Osteoarthritis,    Abdominal   Peds  Hematology negative hematology ROS (+)   Anesthesia Other Findings   Reproductive/Obstetrics negative OB ROS                            Anesthesia Physical Anesthesia Plan  ASA: III  Anesthesia Plan: General   Post-op Pain Management:    Induction: Intravenous  Airway Management Planned: Oral ETT  Additional Equipment:   Intra-op Plan:   Post-operative Plan: Extubation in OR  Informed Consent: I have reviewed the patients History and Physical, chart, labs and discussed the procedure including the risks, benefits and alternatives for the proposed anesthesia with the patient or authorized representative who has indicated his/her understanding and acceptance.   Dental advisory given  Plan Discussed with: CRNA  Anesthesia Plan Comments:         Anesthesia Quick Evaluation

## 2014-08-14 NOTE — Interval H&P Note (Signed)
History and Physical Interval Note:  08/14/2014 7:41 AM  Boston Service  has presented today for surgery, with the diagnosis of RIGHT INGUINAL HERNIA  The various methods of treatment have been discussed with the patient and family. After consideration of risks, benefits and other options for treatment, the patient has consented to  Procedure(s): LAPAROSCOPIC RIGHT INGUINAL HERNIA REPAIR WITH MESH (Right) INSERTION OF MESH (Right) as a surgical intervention .  The patient's history has been reviewed, patient examined, no change in status, stable for surgery.  I have reviewed the patient's chart and labs.  Questions were answered to the patient's satisfaction.     Rosario Jacks., Anne Hahn

## 2014-08-15 ENCOUNTER — Encounter (HOSPITAL_COMMUNITY): Payer: Self-pay | Admitting: General Surgery

## 2014-08-23 ENCOUNTER — Telehealth: Payer: Self-pay | Admitting: Family Medicine

## 2014-08-23 MED ORDER — TAMSULOSIN HCL 0.4 MG PO CAPS: 0.4000 mg | ORAL_CAPSULE | Freq: Every day | ORAL | Status: DC

## 2014-08-23 NOTE — Telephone Encounter (Signed)
I sent script e-scribe. 

## 2014-08-23 NOTE — Telephone Encounter (Signed)
Pt request refill of the following: tamsulosin (FLOMAX) 0.4 MG CAPS capsule   Phamacy: [facility]

## 2014-09-04 ENCOUNTER — Encounter (HOSPITAL_COMMUNITY): Payer: Self-pay | Admitting: General Surgery

## 2014-09-04 NOTE — Addendum Note (Signed)
Addendum  created 09/04/14 0755 by Lillia Abed, MD   Modules edited: Anesthesia Events

## 2014-09-20 ENCOUNTER — Other Ambulatory Visit: Payer: Self-pay | Admitting: Family Medicine

## 2014-10-01 ENCOUNTER — Telehealth: Payer: Self-pay | Admitting: Family Medicine

## 2014-10-01 NOTE — Telephone Encounter (Signed)
Pt is having flu like symptoms and decline to see another provider today. Can I work pt in today?

## 2014-10-01 NOTE — Telephone Encounter (Signed)
S/w pt he said he will try something over the counter. Did not make an appt

## 2014-10-01 NOTE — Telephone Encounter (Signed)
Per Dr. Sarajane Jews if pt does not want to see another provider today, then we can see him tomorrow.

## 2014-10-01 NOTE — Telephone Encounter (Signed)
There is no opening I was asking to create/double book md

## 2014-10-01 NOTE — Telephone Encounter (Signed)
Yes, if we have any openings.

## 2014-12-03 ENCOUNTER — Other Ambulatory Visit: Payer: Self-pay | Admitting: Family Medicine

## 2014-12-04 NOTE — Telephone Encounter (Signed)
Refill for 6 months. 

## 2015-01-08 ENCOUNTER — Other Ambulatory Visit: Payer: Self-pay | Admitting: Family Medicine

## 2015-01-21 ENCOUNTER — Encounter: Payer: Self-pay | Admitting: Family Medicine

## 2015-01-21 ENCOUNTER — Ambulatory Visit (INDEPENDENT_AMBULATORY_CARE_PROVIDER_SITE_OTHER): Payer: Medicare HMO | Admitting: Family Medicine

## 2015-01-21 VITALS — BP 159/98 | HR 76 | Temp 98.4°F | Ht 75.0 in | Wt 206.0 lb

## 2015-01-21 DIAGNOSIS — I10 Essential (primary) hypertension: Secondary | ICD-10-CM

## 2015-01-21 DIAGNOSIS — R634 Abnormal weight loss: Secondary | ICD-10-CM

## 2015-01-21 DIAGNOSIS — G589 Mononeuropathy, unspecified: Secondary | ICD-10-CM

## 2015-01-21 LAB — HEPATIC FUNCTION PANEL
ALBUMIN: 4.2 g/dL (ref 3.5–5.2)
ALT: 12 U/L (ref 0–53)
AST: 15 U/L (ref 0–37)
Alkaline Phosphatase: 72 U/L (ref 39–117)
Bilirubin, Direct: 0.2 mg/dL (ref 0.0–0.3)
Total Bilirubin: 0.7 mg/dL (ref 0.2–1.2)
Total Protein: 6.8 g/dL (ref 6.0–8.3)

## 2015-01-21 LAB — POCT URINALYSIS DIPSTICK
Bilirubin, UA: NEGATIVE
Glucose, UA: NEGATIVE
Ketones, UA: NEGATIVE
Nitrite, UA: NEGATIVE
Spec Grav, UA: 1.02
Urobilinogen, UA: 0.2
pH, UA: 6.5

## 2015-01-21 LAB — CBC WITH DIFFERENTIAL/PLATELET
BASOS ABS: 0 10*3/uL (ref 0.0–0.1)
Basophils Relative: 0.4 % (ref 0.0–3.0)
Eosinophils Absolute: 0.1 10*3/uL (ref 0.0–0.7)
Eosinophils Relative: 2 % (ref 0.0–5.0)
HEMATOCRIT: 43.5 % (ref 39.0–52.0)
HEMOGLOBIN: 14.4 g/dL (ref 13.0–17.0)
LYMPHS PCT: 27 % (ref 12.0–46.0)
Lymphs Abs: 1.3 10*3/uL (ref 0.7–4.0)
MCHC: 33.2 g/dL (ref 30.0–36.0)
MCV: 95.8 fl (ref 78.0–100.0)
MONO ABS: 0.4 10*3/uL (ref 0.1–1.0)
Monocytes Relative: 8.9 % (ref 3.0–12.0)
Neutro Abs: 3 10*3/uL (ref 1.4–7.7)
Neutrophils Relative %: 61.7 % (ref 43.0–77.0)
PLATELETS: 164 10*3/uL (ref 150.0–400.0)
RBC: 4.54 Mil/uL (ref 4.22–5.81)
RDW: 13.8 % (ref 11.5–15.5)
WBC: 4.9 10*3/uL (ref 4.0–10.5)

## 2015-01-21 LAB — BASIC METABOLIC PANEL
BUN: 13 mg/dL (ref 6–23)
CO2: 33 meq/L — AB (ref 19–32)
Calcium: 9.5 mg/dL (ref 8.4–10.5)
Chloride: 102 mEq/L (ref 96–112)
Creatinine, Ser: 1.18 mg/dL (ref 0.40–1.50)
GFR: 65.21 mL/min (ref >60.00)
GLUCOSE: 93 mg/dL (ref 70–99)
POTASSIUM: 4.3 meq/L (ref 3.5–5.1)
Sodium: 143 mEq/L (ref 135–145)

## 2015-01-21 LAB — VITAMIN B12: Vitamin B-12: 225 pg/mL (ref 211–911)

## 2015-01-21 LAB — TSH: TSH: 1.42 u[IU]/mL (ref 0.35–4.50)

## 2015-01-21 MED ORDER — GABAPENTIN 100 MG PO CAPS: 100.0000 mg | ORAL_CAPSULE | Freq: Three times a day (TID) | ORAL | Status: DC

## 2015-01-21 MED ORDER — AMLODIPINE BESYLATE 10 MG PO TABS: 10.0000 mg | ORAL_TABLET | Freq: Every day | ORAL | Status: DC
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
## 2015-01-21 NOTE — Progress Notes (Signed)
Pre visit review using our clinic review tool, if applicable. No additional management support is needed unless otherwise documented below in the visit note. 

## 2015-01-25 ENCOUNTER — Other Ambulatory Visit: Payer: Self-pay | Admitting: Family Medicine

## 2015-01-25 ENCOUNTER — Other Ambulatory Visit (INDEPENDENT_AMBULATORY_CARE_PROVIDER_SITE_OTHER): Payer: Medicare HMO

## 2015-01-25 DIAGNOSIS — E785 Hyperlipidemia, unspecified: Secondary | ICD-10-CM | POA: Diagnosis not present

## 2015-01-25 DIAGNOSIS — E538 Deficiency of other specified B group vitamins: Secondary | ICD-10-CM

## 2015-01-25 LAB — LIPID PANEL
CHOLESTEROL: 170 mg/dL (ref 0–200)
HDL: 60.3 mg/dL (ref >39.00)
LDL Cholesterol: 97 mg/dL (ref 0–99)
NonHDL: 109.49
TRIGLYCERIDES: 62 mg/dL (ref 0.0–149.0)
Total CHOL/HDL Ratio: 3
VLDL: 12.4 mg/dL (ref 0.0–40.0)

## 2015-01-25 MED ORDER — CYANOCOBALAMIN 1000 MCG/ML IJ SOLN: INTRAMUSCULAR | Status: DC

## 2015-01-25 MED ORDER — "INSULIN SYRINGE 28G X 1/2"" 1 ML MISC": Status: DC

## 2015-02-06 ENCOUNTER — Other Ambulatory Visit: Payer: Self-pay | Admitting: Family Medicine

## 2015-02-20 ENCOUNTER — Other Ambulatory Visit: Payer: Self-pay | Admitting: Family Medicine

## 2015-02-20 MED ORDER — TRAZODONE HCL 50 MG PO TABS: 50.0000 mg | ORAL_TABLET | Freq: Every day | ORAL | Status: DC

## 2015-02-20 NOTE — Telephone Encounter (Signed)
Rx sent to pharmacy   

## 2015-02-20 NOTE — Telephone Encounter (Signed)
Call in #90 with 3 rf  

## 2015-02-20 NOTE — Telephone Encounter (Signed)
Pt needs refill on trazodone 50 mg send to [facility] [address]

## 2015-03-25 ENCOUNTER — Other Ambulatory Visit (INDEPENDENT_AMBULATORY_CARE_PROVIDER_SITE_OTHER): Payer: Medicare HMO

## 2015-03-25 ENCOUNTER — Ambulatory Visit (INDEPENDENT_AMBULATORY_CARE_PROVIDER_SITE_OTHER): Payer: Medicare HMO | Admitting: Adult Health

## 2015-03-25 ENCOUNTER — Encounter: Payer: Self-pay | Admitting: Adult Health

## 2015-03-25 VITALS — BP 118/82 | Temp 97.9°F | Ht 75.0 in | Wt 211.0 lb

## 2015-03-25 DIAGNOSIS — E538 Deficiency of other specified B group vitamins: Secondary | ICD-10-CM | POA: Diagnosis not present

## 2015-03-25 DIAGNOSIS — H578 Other specified disorders of eye and adnexa: Secondary | ICD-10-CM | POA: Diagnosis not present

## 2015-03-25 DIAGNOSIS — H5789 Other specified disorders of eye and adnexa: Secondary | ICD-10-CM

## 2015-03-25 LAB — VITAMIN B12: VITAMIN B 12: 464 pg/mL (ref 211–911)

## 2015-03-25 MED ORDER — METHYLPREDNISOLONE ACETATE 80 MG/ML IJ SUSP
160.0000 mg | Freq: Once | INTRAMUSCULAR | Status: AC
  Administered 2015-03-25: 160 mg via INTRAMUSCULAR

## 2015-03-25 NOTE — Progress Notes (Signed)
Pre visit review using our clinic review tool, if applicable. No additional management support is needed unless otherwise documented below in the visit note. 

## 2015-03-25 NOTE — Progress Notes (Signed)
   Subjective:    Patient ID: Brandon Schroeder, male    DOB: [birth year], 71 y.o.   MRN: [id number]  HPI  69 year old male who presents to the office today for an acute complaint of swelling to left eye lid. His eye started swelling 2-3 day ago and the swelling has become worse. He denies any pain or itching. The swelling has started to effect his vision. He does have photophobia but this is a chronic issue after having eye surgery on his right eye.   He denies any drainage or discharge from the eye  Denies trauma, insect bites, new lotions or soaps. The only thing he can think of would be that he was moving plants and he may have come in contact with one?     Review of Systems  HENT: Positive for facial swelling. Negative for ear discharge, ear pain, postnasal drip, rhinorrhea, sinus pressure, trouble swallowing and voice change.   Eyes: Positive for photophobia and visual disturbance. Negative for discharge, redness and itching.       Swelling  Respiratory: Negative.   Cardiovascular: Negative.   Skin: Positive for color change (redness).  Neurological: Negative.   All other systems reviewed and are negative.      Objective:   Physical Exam  Constitutional: He is oriented to person, place, and time. He appears well-developed and well-nourished. No distress.  Eyes: Conjunctivae, EOM and lids are normal. Pupils are equal, round, and reactive to light. Lids are everted and swept, no foreign bodies found. Right eye exhibits no discharge, no exudate and no hordeolum. No foreign body present in the right eye. Left eye exhibits no discharge, no exudate and no hordeolum. No foreign body present in the left eye.    Edema noted under left eye. No masses felt  Neurological: He is alert and oriented to person, place, and time.  Skin: Skin is warm and dry. No rash noted. He is not diaphoretic. There is erythema (left eye). No pallor.  Psychiatric: He has a normal mood and affect. His behavior is  normal. Judgment and thought content normal.  Nursing note and vitals reviewed.     Assessment & Plan:   1. Eye swelling, left - Appears as an allergic reaction. Less likely glaucoma or iritis.  - methylPREDNISolone acetate (DEPO-MEDROL) injection 160 mg; Inject 2 mLs (160 mg total) into the muscle once. - Ice for 20 minutes at a time - Follow up in one day if no improvement or see eye doctor

## 2015-03-25 NOTE — Patient Instructions (Signed)
It was great seeing you again!   It appears as though you are having an allergic reaction.   You should start to notice a difference in the swelling by tomorrow. If you do not, please follow up with me. If you have any increased blurred vision or if the light hurts your eyes, please follow up with your eye doctor.

## 2015-03-26 ENCOUNTER — Ambulatory Visit (INDEPENDENT_AMBULATORY_CARE_PROVIDER_SITE_OTHER): Payer: Medicare HMO | Admitting: Family Medicine

## 2015-03-26 ENCOUNTER — Encounter: Payer: Self-pay | Admitting: Family Medicine

## 2015-03-26 VITALS — BP 123/81 | HR 77 | Temp 98.5°F | Ht 75.0 in | Wt 210.0 lb

## 2015-03-26 DIAGNOSIS — L259 Unspecified contact dermatitis, unspecified cause: Secondary | ICD-10-CM | POA: Diagnosis not present

## 2015-03-26 MED ORDER — PREDNISONE 10 MG PO TABS: ORAL_TABLET | ORAL | Status: DC

## 2015-03-26 NOTE — Progress Notes (Signed)
Pre visit review using our clinic review tool, if applicable. No additional management support is needed unless otherwise documented below in the visit note. 

## 2015-03-27 ENCOUNTER — Encounter: Payer: Self-pay | Admitting: Family Medicine

## 2015-03-27 ENCOUNTER — Telehealth: Payer: Self-pay | Admitting: Family Medicine

## 2015-03-27 NOTE — Progress Notes (Signed)
   Subjective:    Patient ID: Brandon Schroeder, male    DOB: Sep 21, 1945, 70 y.o.   MRN: [id number]  HPI Here to follow up on facial swelling. He was seen yesterday for the beginning of swelling and itching around the left eye. It was felt that this was an allergic reaction and he was given a shot of Solumedrol. Since then he has gotten worse and now has swelling around both eyes and the entire face. The face itches but is not painful. He has also developed widespread hives patches on the trunk. He is taking Benadryl. He denies any tongue or mouth swelling, no wheezing or SOB. He has had no recent change in medications, soaps, detergents, etc. He does note that he is very sensitive to poison ivy and that he was home working in his yard the day before this appeared. He even lost his balance and fell that day, landing on his right arm.    Review of Systems  Constitutional: Negative.   HENT: Positive for facial swelling.   Respiratory: Negative.   Cardiovascular: Negative.   Skin: Positive for rash.  Neurological: Negative.        Objective:   Physical Exam  Constitutional: He is oriented to person, place, and time. He appears well-developed and well-nourished. No distress.  HENT:  There is extensive swelling and erythema around both eyes and to a lesser degree on the cheeks and chin.   Eyes: Conjunctivae and EOM are normal. Pupils are equal, round, and reactive to light.  Neck: Normal range of motion. Neck supple. No thyromegaly present.  Cardiovascular: Normal rate, regular rhythm, normal heart sounds and intact distal pulses.   Pulmonary/Chest: Effort normal and breath sounds normal.  Lymphadenopathy:    He has no cervical adenopathy.  Neurological: He is alert and oriented to person, place, and time.  Skin:  Patches of erythematous wheals and flares on the trunk and the right arm          Assessment & Plan:  This appears to be an allergic reaction, likely to plants in his yard when  he fell last weekend. He is given a taper of oral prednisone, he will take Benadryl 25-50 mg every 4 hours, and he will take Zantac 300 mg bid. Recheck prn

## 2015-03-27 NOTE — Telephone Encounter (Signed)
Pt was seen yesterday and swelling in left eye is 50% and right ear is stable. Pt is still having redness in face.

## 2015-03-27 NOTE — Telephone Encounter (Signed)
Noted  

## 2015-04-16 ENCOUNTER — Telehealth: Payer: Self-pay | Admitting: Family Medicine

## 2015-04-16 NOTE — Telephone Encounter (Signed)
Call in Gabapentin 100 mg 4 times a day, #120 with 11 rf

## 2015-04-16 NOTE — Telephone Encounter (Signed)
Spoke with pharmacy and pt is taking the medication 4 times a day and as Dr Sarajane Jews last note said GABAPENTIN 100MG . Tried to get in contact with pt but unable voicemail was left

## 2015-04-16 NOTE — Telephone Encounter (Signed)
Pt states his rx was called in incorrectly. gabapentin (NEURONTIN) 300 MG capsule  Pt states he takes 2 in the am and 2 in pm (4 /day)  The pharmacy would not give him but 15 tabs until lastest rx called in was corrected. rx was changed 08/31/14  To 360 tabs /90 day  [facility] / [address]  Pt states he is out and hopes someone can call in asap.

## 2015-04-17 NOTE — Telephone Encounter (Signed)
Patient notified that Rx was sent in. 

## 2015-04-17 NOTE — Telephone Encounter (Signed)
Rx called in as directed.   

## 2015-05-09 ENCOUNTER — Other Ambulatory Visit: Payer: Self-pay | Admitting: Family Medicine

## 2015-06-01 ENCOUNTER — Other Ambulatory Visit: Payer: Self-pay | Admitting: Family Medicine

## 2015-06-10 ENCOUNTER — Other Ambulatory Visit: Payer: Self-pay | Admitting: Family Medicine

## 2015-06-20 ENCOUNTER — Other Ambulatory Visit: Payer: Self-pay | Admitting: Family Medicine

## 2015-06-20 NOTE — Telephone Encounter (Signed)
Call in #[phone] with 5 rf 

## 2015-09-11 ENCOUNTER — Telehealth: Payer: Self-pay | Admitting: Pulmonary Disease

## 2015-09-11 NOTE — Telephone Encounter (Signed)
Records requested printed and left at front desk for pick up. Left VM stating they can be picked up any day by 5pm. Nothing further needed.

## 2015-09-24 ENCOUNTER — Ambulatory Visit (INDEPENDENT_AMBULATORY_CARE_PROVIDER_SITE_OTHER): Payer: Medicare HMO | Admitting: Adult Health

## 2015-09-24 ENCOUNTER — Encounter: Payer: Self-pay | Admitting: Adult Health

## 2015-09-24 DIAGNOSIS — G4737 Central sleep apnea in conditions classified elsewhere: Secondary | ICD-10-CM

## 2015-09-24 DIAGNOSIS — G4731 Primary central sleep apnea: Secondary | ICD-10-CM

## 2015-09-24 DIAGNOSIS — G4733 Obstructive sleep apnea (adult) (pediatric): Secondary | ICD-10-CM | POA: Diagnosis not present

## 2015-09-24 NOTE — Patient Instructions (Signed)
Continue on ASV At bedtime   Wear for at least 4-6 hr each night.  Follow up Dr. Corrie Dandy in 1 year and As needed

## 2015-09-24 NOTE — Assessment & Plan Note (Signed)
Well controlled on ASV device   Plan  Patient Instructions  Continue on ASV At bedtime   Wear for at least 4-6 hr each night.  Follow up Dr. Corrie Dandy in 1 year and As needed

## 2015-09-24 NOTE — Addendum Note (Signed)
Addended by: Osa Craver on: 09/24/2015 03:15 PM   Modules accepted: Orders
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
## 2015-10-01 ENCOUNTER — Other Ambulatory Visit: Payer: Self-pay | Admitting: Orthopaedic Surgery

## 2015-10-01 DIAGNOSIS — M545 Low back pain: Secondary | ICD-10-CM

## 2015-10-04 ENCOUNTER — Encounter: Payer: Self-pay | Admitting: Adult Health

## 2015-10-14 ENCOUNTER — Ambulatory Visit (HOSPITAL_COMMUNITY)
Admission: RE | Admit: 2015-10-14 | Discharge: 2015-10-14 | Disposition: A | Discharge status: Home / Self Care | Payer: Medicare HMO | Source: Ambulatory Visit | Attending: Orthopaedic Surgery | Admitting: Orthopaedic Surgery

## 2015-10-14 DIAGNOSIS — M5136 Other intervertebral disc degeneration, lumbar region: Secondary | ICD-10-CM | POA: Diagnosis not present

## 2015-10-14 DIAGNOSIS — M4856XA Collapsed vertebra, not elsewhere classified, lumbar region, initial encounter for fracture: Secondary | ICD-10-CM | POA: Diagnosis not present

## 2015-10-14 DIAGNOSIS — M4806 Spinal stenosis, lumbar region: Secondary | ICD-10-CM | POA: Insufficient documentation

## 2015-10-14 DIAGNOSIS — M4854XA Collapsed vertebra, not elsewhere classified, thoracic region, initial encounter for fracture: Secondary | ICD-10-CM | POA: Diagnosis not present

## 2015-10-14 DIAGNOSIS — M545 Low back pain: Secondary | ICD-10-CM | POA: Diagnosis present

## 2015-12-09 ENCOUNTER — Other Ambulatory Visit: Payer: Self-pay | Admitting: Family Medicine

## 2015-12-10 NOTE — Telephone Encounter (Signed)
Call in Zolpidem #30 with 5 rf, also Tamsulosin #30 with 11 rf

## 2015-12-11 ENCOUNTER — Telehealth (INDEPENDENT_AMBULATORY_CARE_PROVIDER_SITE_OTHER): Payer: Self-pay | Admitting: Physical Medicine and Rehabilitation

## 2015-12-11 NOTE — Telephone Encounter (Signed)
Patient left message wanting another appointment for back pain. We need to find out if his last injection helped to determine what to scheduled him for per Dr. Romona Curls last note in [facility]. I called the patient and left a message asking him to call us.

## 2015-12-12 ENCOUNTER — Telehealth: Payer: Self-pay

## 2015-12-12 ENCOUNTER — Telehealth (INDEPENDENT_AMBULATORY_CARE_PROVIDER_SITE_OTHER): Payer: Self-pay | Admitting: Physical Medicine and Rehabilitation

## 2015-12-12 NOTE — Telephone Encounter (Signed)
Received PA request from [facility] for Ambien 10 mg. PA submitted & is pending. Key: Tery Sanfilippo

## 2015-12-12 NOTE — Telephone Encounter (Signed)
I spoke with the patient and scheduled him for an office visit on 12/17/15.

## 2015-12-12 NOTE — Telephone Encounter (Signed)
Office visit

## 2015-12-16 NOTE — Telephone Encounter (Signed)
PA approved. Pharmacy aware & will fill.

## 2015-12-17 ENCOUNTER — Encounter (INDEPENDENT_AMBULATORY_CARE_PROVIDER_SITE_OTHER): Payer: Self-pay | Admitting: Physical Medicine and Rehabilitation

## 2015-12-17 ENCOUNTER — Ambulatory Visit (INDEPENDENT_AMBULATORY_CARE_PROVIDER_SITE_OTHER): Payer: Medicare HMO | Admitting: Physical Medicine and Rehabilitation

## 2015-12-17 VITALS — BP 140/93 | HR 64

## 2015-12-17 DIAGNOSIS — M47816 Spondylosis without myelopathy or radiculopathy, lumbar region: Secondary | ICD-10-CM | POA: Diagnosis not present

## 2015-12-17 DIAGNOSIS — M4155 Other secondary scoliosis, thoracolumbar region: Secondary | ICD-10-CM

## 2015-12-17 DIAGNOSIS — G8929 Other chronic pain: Secondary | ICD-10-CM | POA: Diagnosis not present

## 2015-12-17 DIAGNOSIS — M545 Low back pain, unspecified: Secondary | ICD-10-CM

## 2015-12-17 DIAGNOSIS — M25551 Pain in right hip: Secondary | ICD-10-CM | POA: Diagnosis not present

## 2015-12-17 MED ORDER — BACLOFEN 10 MG PO TABS: 10.0000 mg | ORAL_TABLET | Freq: Three times a day (TID) | ORAL | 0 refills | Status: DC | PRN
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
## 2015-12-23 ENCOUNTER — Telehealth (INDEPENDENT_AMBULATORY_CARE_PROVIDER_SITE_OTHER): Payer: Self-pay

## 2015-12-23 NOTE — Telephone Encounter (Signed)
Bil RFA approved. [id number]. I called pt and scheduled him for 12/30/15 @ 3:00 and 01/06/16 @ 3:30 with driver.

## 2015-12-30 ENCOUNTER — Telehealth (INDEPENDENT_AMBULATORY_CARE_PROVIDER_SITE_OTHER): Payer: Self-pay

## 2015-12-30 ENCOUNTER — Ambulatory Visit (INDEPENDENT_AMBULATORY_CARE_PROVIDER_SITE_OTHER): Payer: Medicare HMO | Admitting: Physical Medicine and Rehabilitation

## 2015-12-30 ENCOUNTER — Encounter (INDEPENDENT_AMBULATORY_CARE_PROVIDER_SITE_OTHER): Payer: Self-pay | Admitting: Physical Medicine and Rehabilitation

## 2015-12-30 VITALS — BP 129/87 | HR 82

## 2015-12-30 DIAGNOSIS — G8929 Other chronic pain: Secondary | ICD-10-CM | POA: Diagnosis not present

## 2015-12-30 DIAGNOSIS — M47816 Spondylosis without myelopathy or radiculopathy, lumbar region: Secondary | ICD-10-CM | POA: Diagnosis not present

## 2015-12-30 DIAGNOSIS — M545 Low back pain, unspecified: Secondary | ICD-10-CM

## 2015-12-30 MED ORDER — LIDOCAINE HCL (PF) 1 % IJ SOLN
0.3300 mL | Freq: Once | INTRAMUSCULAR | Status: AC
  Administered 2015-12-30: 0.3 mL

## 2015-12-30 MED ORDER — METHYLPREDNISOLONE ACETATE 80 MG/ML IJ SUSP
80.0000 mg | Freq: Once | INTRAMUSCULAR | Status: AC
  Administered 2015-12-30: 80 mg

## 2015-12-30 NOTE — Telephone Encounter (Signed)
Called [employer] to ? Auth. Pts wife stated auth they received said only effective for 12/30/15. I called [employer] and spoke with Lucky Rathke. Who said that 2 separate auth request should have been submitted since 2 different days. Started new auth and I answered questions pertaining to his treatment and [phone] and [phone] were auto approved. [id number] for 01/06/16.

## 2015-12-30 NOTE — Patient Instructions (Signed)

## 2015-12-30 NOTE — Procedures (Signed)
Lumbar Facet Joint Nerve Denervation  [name]      Date of Birth: April 17, 1945 MRN: [id number] PCP: Laurey Morale, MD      Visit Date: 12/30/2015   Universal Protocol:    Date/Time: 11/13/173:41 PM  Consent Given By: the patient  Position: PRONE  Additional Comments: Vital signs were monitored before and after the procedure. Patient was prepped and draped in the usual sterile fashion. The correct patient, procedure, and site was verified.   Injection Procedure Details:  Procedure Site One Meds Administered:  Meds ordered this encounter  Medications  . lidocaine (PF) (XYLOCAINE) 1 % injection 0.3 mL  . methylPREDNISolone acetate (DEPO-MEDROL) injection 80 mg     Laterality: Right  Location/Site:  L4-L5 L5-S1  Needle size: 18 G  Needle type: Radiofrequency cannula  Needle Placement: Along juncture of superior articular process and transverse pocess  Findings:  -Comments:  Procedure Details: For each desired target nerve, the corresponding transverse process (sacral ala for the L5 dorsal rami) was identified and the fluoroscope was positioned to square off the endplates of the corresponding vertebral body to achieve a true AP midline view.  The beam was then obliqued 15 to 20 degrees and caudally tilted 15 to 20 degrees to line up a trajectory along the target nerves. The skin over the target of the junction of superior articulating process and transverse process (sacral ala for the L5 dorsal rami) was infiltrated with 15ml of 1% Lidocaine without Epinephrine.  The 18 gauge 39mm active tip outer cannula was advanced in trajectory view to the target.  This procedure was repeated for each target nerve.  Then, for all levels, the outer cannula placement was fine-tuned and the position was then confirmed with bi-planar imaging.    Test stimulation was done both at sensory and motor levels to ensure there was no radicular stimulation. The target tissues were then  infiltrated with 1 ml of 1% Lidocaine without Epinephrine. Subsequently, a percutaneous neurotomy was carried out for 60 seconds at 80 degrees Celsius. The procedure was repeated with the cannula rotated 90 degrees, for duration of 60 seconds, one additional time at each level for a total of two lesions per level.  After the completion of the two lesions, 1 ml of injectate was delivered. It was then repeated for each facet joint nerve mentioned above. Appropriate radiographs were obtained to verify the probe placement during the neurotomy.   Additional Comments:  The patient tolerated the procedure well Dressing: Band-Aid    Post-procedure details: Patient was observed during the procedure. Post-procedure instructions were reviewed.  Patient left the clinic in stable condition.
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
## 2016-01-06 ENCOUNTER — Encounter (INDEPENDENT_AMBULATORY_CARE_PROVIDER_SITE_OTHER): Payer: Self-pay | Admitting: Physical Medicine and Rehabilitation

## 2016-01-06 ENCOUNTER — Ambulatory Visit (INDEPENDENT_AMBULATORY_CARE_PROVIDER_SITE_OTHER): Payer: Medicare HMO | Admitting: Physical Medicine and Rehabilitation

## 2016-01-06 VITALS — BP 135/94 | HR 85 | Temp 98.1°F

## 2016-01-06 DIAGNOSIS — M545 Low back pain: Secondary | ICD-10-CM

## 2016-01-06 DIAGNOSIS — G8929 Other chronic pain: Secondary | ICD-10-CM | POA: Diagnosis not present

## 2016-01-06 DIAGNOSIS — M47816 Spondylosis without myelopathy or radiculopathy, lumbar region: Secondary | ICD-10-CM

## 2016-01-06 MED ORDER — LIDOCAINE HCL (PF) 1 % IJ SOLN
0.3300 mL | Freq: Once | INTRAMUSCULAR | Status: AC
  Administered 2016-01-06: 0.3 mL

## 2016-01-06 MED ORDER — METHYLPREDNISOLONE ACETATE 80 MG/ML IJ SUSP
80.0000 mg | Freq: Once | INTRAMUSCULAR | Status: AC
  Administered 2016-01-06: 80 mg

## 2016-01-06 NOTE — Procedures (Signed)
Lumbar Facet Joint Nerve Denervation  [name]      Date of Birth: May 28, 1945 MRN: [id number] PCP: Laurey Morale, MD      Visit Date: 01/06/2016   Universal Protocol:    Date/Time: 11/20/173:41 PM  Consent Given By: the patient  Position: PRONE  Additional Comments: Vital signs were monitored before and after the procedure. Patient was prepped and draped in the usual sterile fashion. The correct patient, procedure, and site was verified.   Injection Procedure Details:  Procedure Site One Meds Administered:  Meds ordered this encounter  Medications  . lidocaine (PF) (XYLOCAINE) 1 % injection 0.3 mL  . methylPREDNISolone acetate (DEPO-MEDROL) injection 80 mg     Laterality: Left  Location/Site:  L4-L5 L5-S1  Needle size: 18 G  Needle type: Radiofrequency cannula  Needle Placement: Along juncture of superior articular process and transverse pocess  Findings:  -Comments:  Procedure Details: For each desired target nerve, the corresponding transverse process (sacral ala for the L5 dorsal rami) was identified and the fluoroscope was positioned to square off the endplates of the corresponding vertebral body to achieve a true AP midline view.  The beam was then obliqued 15 to 20 degrees and caudally tilted 15 to 20 degrees to line up a trajectory along the target nerves. The skin over the target of the junction of superior articulating process and transverse process (sacral ala for the L5 dorsal rami) was infiltrated with 23ml of 1% Lidocaine without Epinephrine.  The 18 gauge 7mm active tip outer cannula was advanced in trajectory view to the target.  This procedure was repeated for each target nerve.  Then, for all levels, the outer cannula placement was fine-tuned and the position was then confirmed with bi-planar imaging.    Test stimulation was done both at sensory and motor levels to ensure there was no radicular stimulation. The target tissues were then  infiltrated with 1 ml of 1% Lidocaine without Epinephrine. Subsequently, a percutaneous neurotomy was carried out for 60 seconds at 80 degrees Celsius. The procedure was repeated with the cannula rotated 90 degrees, for duration of 60 seconds, one additional time at each level for a total of two lesions per level.  After the completion of the two lesions, 1 ml of injectate was delivered. It was then repeated for each facet joint nerve mentioned above. Appropriate radiographs were obtained to verify the probe placement during the neurotomy.   Additional Comments:  The patient tolerated the procedure well Dressing: Band-Aid    Post-procedure details: Patient was observed during the procedure. Post-procedure instructions were reviewed.  Patient left the clinic in stable condition.

## 2016-01-06 NOTE — Patient Instructions (Signed)
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
## 2016-01-28 ENCOUNTER — Ambulatory Visit (INDEPENDENT_AMBULATORY_CARE_PROVIDER_SITE_OTHER): Payer: Medicare HMO | Admitting: Physical Medicine and Rehabilitation

## 2016-02-06 ENCOUNTER — Other Ambulatory Visit: Payer: Self-pay | Admitting: Family Medicine

## 2016-02-06 NOTE — Telephone Encounter (Signed)
Patient's was seen for CPE 01/21/15 -  Patient was last seen 04/05/15 for an acute issue. Ok to refill?

## 2016-02-19 ENCOUNTER — Other Ambulatory Visit (INDEPENDENT_AMBULATORY_CARE_PROVIDER_SITE_OTHER): Payer: PRIVATE HEALTH INSURANCE

## 2016-02-19 DIAGNOSIS — Z0000 Encounter for general adult medical examination without abnormal findings: Secondary | ICD-10-CM

## 2016-02-19 LAB — HEPATIC FUNCTION PANEL
ALT: 17 U/L (ref 0–53)
AST: 16 U/L (ref 0–37)
Albumin: 4.2 g/dL (ref 3.5–5.2)
Alkaline Phosphatase: 64 U/L (ref 39–117)
Bilirubin, Direct: 0.1 mg/dL (ref 0.0–0.3)
Total Bilirubin: 0.6 mg/dL (ref 0.2–1.2)
Total Protein: 6.2 g/dL (ref 6.0–8.3)

## 2016-02-19 LAB — POC URINALSYSI DIPSTICK (AUTOMATED)
Bilirubin, UA: NEGATIVE
Glucose, UA: NEGATIVE
KETONES UA: NEGATIVE
NITRITE UA: NEGATIVE
PH UA: 6.5
Spec Grav, UA: 1.02
Urobilinogen, UA: 0.2

## 2016-02-19 LAB — CBC WITH DIFFERENTIAL/PLATELET
Basophils Absolute: 0 10*3/uL (ref 0.0–0.1)
Basophils Relative: 0.2 % (ref 0.0–3.0)
Eosinophils Absolute: 0.1 10*3/uL (ref 0.0–0.7)
Eosinophils Relative: 1.7 % (ref 0.0–5.0)
HCT: 40.8 % (ref 39.0–52.0)
Hemoglobin: 14.1 g/dL (ref 13.0–17.0)
Lymphocytes Relative: 26.9 % (ref 12.0–46.0)
Lymphs Abs: 1.2 10*3/uL (ref 0.7–4.0)
MCHC: 34.5 g/dL (ref 30.0–36.0)
MCV: 96.4 fl (ref 78.0–100.0)
Monocytes Absolute: 0.4 10*3/uL (ref 0.1–1.0)
Monocytes Relative: 8.2 % (ref 3.0–12.0)
Neutro Abs: 2.9 10*3/uL (ref 1.4–7.7)
Neutrophils Relative %: 63 % (ref 43.0–77.0)
Platelets: 140 10*3/uL — ABNORMAL LOW (ref 150.0–400.0)
RBC: 4.23 Mil/uL (ref 4.22–5.81)
RDW: 13.4 % (ref 11.5–15.5)
WBC: 4.5 10*3/uL (ref 4.0–10.5)

## 2016-02-19 LAB — PSA: PSA: 6.03 ng/mL — ABNORMAL HIGH (ref 0.10–4.00)

## 2016-02-19 LAB — BASIC METABOLIC PANEL WITH GFR
BUN: 22 mg/dL (ref 6–23)
CO2: 35 meq/L — ABNORMAL HIGH (ref 19–32)
Calcium: 9.2 mg/dL (ref 8.4–10.5)
Chloride: 101 meq/L (ref 96–112)
Creatinine, Ser: 1.2 mg/dL (ref 0.40–1.50)
GFR: 63.76 mL/min
Glucose, Bld: 108 mg/dL — ABNORMAL HIGH (ref 70–99)
Potassium: 3.6 meq/L (ref 3.5–5.1)
Sodium: 143 meq/L (ref 135–145)

## 2016-02-19 LAB — LIPID PANEL
Cholesterol: 167 mg/dL (ref 0–200)
HDL: 60.5 mg/dL
LDL Cholesterol: 95 mg/dL (ref 0–99)
NonHDL: 106.97
Total CHOL/HDL Ratio: 3
Triglycerides: 61 mg/dL (ref 0.0–149.0)
VLDL: 12.2 mg/dL (ref 0.0–40.0)

## 2016-02-19 LAB — TSH: TSH: 1.21 u[IU]/mL (ref 0.35–4.50)

## 2016-02-21 ENCOUNTER — Encounter: Payer: Self-pay | Admitting: Adult Health

## 2016-02-21 ENCOUNTER — Ambulatory Visit (INDEPENDENT_AMBULATORY_CARE_PROVIDER_SITE_OTHER): Payer: PRIVATE HEALTH INSURANCE | Admitting: Adult Health

## 2016-02-21 VITALS — BP 132/84 | Temp 97.9°F | Ht 75.0 in | Wt 232.0 lb

## 2016-02-21 DIAGNOSIS — J0101 Acute recurrent maxillary sinusitis: Secondary | ICD-10-CM | POA: Diagnosis not present

## 2016-02-21 MED ORDER — DOXYCYCLINE HYCLATE 100 MG PO CAPS: 100.0000 mg | ORAL_CAPSULE | Freq: Two times a day (BID) | ORAL | 0 refills | Status: DC
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
## 2016-02-24 ENCOUNTER — Ambulatory Visit (INDEPENDENT_AMBULATORY_CARE_PROVIDER_SITE_OTHER): Payer: Medicare HMO | Admitting: Orthopaedic Surgery

## 2016-02-24 ENCOUNTER — Encounter (INDEPENDENT_AMBULATORY_CARE_PROVIDER_SITE_OTHER): Payer: Self-pay | Admitting: Orthopaedic Surgery

## 2016-02-24 ENCOUNTER — Ambulatory Visit (INDEPENDENT_AMBULATORY_CARE_PROVIDER_SITE_OTHER): Payer: PPO | Admitting: Orthopaedic Surgery

## 2016-02-24 ENCOUNTER — Encounter: Payer: Medicare HMO | Admitting: Family Medicine

## 2016-02-24 VITALS — BP 128/94 | HR 87 | Ht 75.0 in | Wt 220.0 lb

## 2016-02-24 DIAGNOSIS — R3911 Hesitancy of micturition: Secondary | ICD-10-CM | POA: Diagnosis not present

## 2016-02-24 DIAGNOSIS — G8929 Other chronic pain: Secondary | ICD-10-CM

## 2016-02-24 DIAGNOSIS — N200 Calculus of kidney: Secondary | ICD-10-CM | POA: Diagnosis not present

## 2016-02-24 DIAGNOSIS — M25561 Pain in right knee: Secondary | ICD-10-CM | POA: Diagnosis not present

## 2016-02-24 DIAGNOSIS — N401 Enlarged prostate with lower urinary tract symptoms: Secondary | ICD-10-CM | POA: Diagnosis not present

## 2016-02-24 DIAGNOSIS — R972 Elevated prostate specific antigen [PSA]: Secondary | ICD-10-CM | POA: Diagnosis not present

## 2016-02-24 MED ORDER — METHYLPREDNISOLONE ACETATE 40 MG/ML IJ SUSP
80.0000 mg | INTRAMUSCULAR | Status: AC | PRN
  Administered 2016-02-24: 80 mg

## 2016-02-24 MED ORDER — BUPIVACAINE HCL 0.5 % IJ SOLN
3.0000 mL | INTRAMUSCULAR | Status: AC | PRN
  Administered 2016-02-24: 3 mL via INTRA_ARTICULAR

## 2016-02-24 MED ORDER — LIDOCAINE HCL 1 % IJ SOLN
5.0000 mL | INTRAMUSCULAR | Status: AC | PRN
  Administered 2016-02-24: 5 mL
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
## 2016-02-26 ENCOUNTER — Ambulatory Visit (INDEPENDENT_AMBULATORY_CARE_PROVIDER_SITE_OTHER): Payer: PPO | Admitting: Family Medicine

## 2016-02-26 ENCOUNTER — Encounter: Payer: Self-pay | Admitting: Family Medicine

## 2016-02-26 VITALS — BP 132/88 | HR 74 | Temp 98.1°F | Ht 75.0 in | Wt 222.0 lb

## 2016-02-26 DIAGNOSIS — Z23 Encounter for immunization: Secondary | ICD-10-CM | POA: Diagnosis not present

## 2016-02-26 DIAGNOSIS — Z0000 Encounter for general adult medical examination without abnormal findings: Secondary | ICD-10-CM

## 2016-02-26 MED ORDER — SERTRALINE HCL 100 MG PO TABS: 100.0000 mg | ORAL_TABLET | Freq: Two times a day (BID) | ORAL | 3 refills | Status: AC

## 2016-02-26 MED ORDER — AMLODIPINE BESYLATE 10 MG PO TABS: 10.0000 mg | ORAL_TABLET | Freq: Every day | ORAL | 3 refills | Status: AC

## 2016-02-26 MED ORDER — CEFUROXIME AXETIL 500 MG PO TABS: 500.0000 mg | ORAL_TABLET | Freq: Two times a day (BID) | ORAL | 0 refills | Status: DC

## 2016-02-26 MED ORDER — TAMSULOSIN HCL 0.4 MG PO CAPS: 0.4000 mg | ORAL_CAPSULE | Freq: Every day | ORAL | 3 refills | Status: AC

## 2016-02-26 MED ORDER — HYDROCHLOROTHIAZIDE 25 MG PO TABS: 25.0000 mg | ORAL_TABLET | Freq: Every day | ORAL | 3 refills | Status: DC

## 2016-02-26 MED ORDER — TRAZODONE HCL 100 MG PO TABS: 100.0000 mg | ORAL_TABLET | Freq: Every day | ORAL | 3 refills | Status: DC

## 2016-02-26 MED ORDER — GABAPENTIN 100 MG PO CAPS: 100.0000 mg | ORAL_CAPSULE | Freq: Four times a day (QID) | ORAL | 5 refills | Status: DC

## 2016-02-26 NOTE — Progress Notes (Signed)
Pre visit review using our clinic review tool, if applicable. No additional management support is needed unless otherwise documented below in the visit note. 

## 2016-02-26 NOTE — Progress Notes (Signed)
   Subjective:    Patient ID: Brandon Schroeder, male    DOB: 1945/06/02, 71 y.o.   MRN: [id number]  HPI 71 yr old male for a well exam. He has a few issues to discuss. First he was seen here on 02-21-16 for a sinusitis and was given Doxycycline. This has not helped and he still has sinus pressure, PND, and a dry cough. No fever. Also he he trouble sleeping. He had tried Trazodone 50 mg for awhile with poor results, then he tried Ambien. This also gave him mixed results. On his recent labs here his PSA had jumped up to over 6. So we asked him to follow up with Dr. Jeffie Pollock, his Urologist. In fact he did see Dr. Jeffie Pollock 2 days ago and his DRE was normal. A PSA in that office was 4.25, so apparently Dr. Jeffie Pollock was not concerned. They will simply repeat this at some point.    Review of Systems  Constitutional: Negative.   HENT: Positive for congestion, postnasal drip, sinus pain and sinus pressure. Negative for dental problem, drooling, ear discharge, ear pain, facial swelling, hearing loss, mouth sores, nosebleeds, rhinorrhea, sneezing, sore throat, trouble swallowing and voice change.   Eyes: Negative.   Respiratory: Positive for cough. Negative for apnea, choking, chest tightness, shortness of breath, wheezing and stridor.   Cardiovascular: Negative.   Gastrointestinal: Negative.   Genitourinary: Negative.   Musculoskeletal: Negative.   Skin: Negative.   Neurological: Negative.   Psychiatric/Behavioral: Negative.        Objective:   Physical Exam  Constitutional: He is oriented to person, place, and time. He appears well-developed and well-nourished. No distress.  HENT:  Head: Normocephalic and atraumatic.  Right Ear: External ear normal.  Left Ear: External ear normal.  Nose: Nose normal.  Mouth/Throat: Oropharynx is clear and moist. No oropharyngeal exudate.  Eyes: Conjunctivae and EOM are normal. Pupils are equal, round, and reactive to light. Right eye exhibits no discharge. Left eye exhibits  no discharge. No scleral icterus.  Neck: Neck supple. No JVD present. No tracheal deviation present. No thyromegaly present.  Cardiovascular: Normal rate, regular rhythm, normal heart sounds and intact distal pulses.  Exam reveals no gallop and no friction rub.   No murmur heard. Pulmonary/Chest: Effort normal and breath sounds normal. No respiratory distress. He has no wheezes. He has no rales. He exhibits no tenderness.  Abdominal: Soft. Bowel sounds are normal. He exhibits no distension and no mass. There is no tenderness. There is no rebound and no guarding.  Musculoskeletal: Normal range of motion. He exhibits no edema or tenderness.  Lymphadenopathy:    He has no cervical adenopathy.  Neurological: He is alert and oriented to person, place, and time. He has normal reflexes. No cranial nerve deficit. He exhibits normal muscle tone. Coordination normal.  Skin: Skin is warm and dry. No rash noted. He is not diaphoretic. No erythema. No pallor.  Psychiatric: He has a normal mood and affect. His behavior is normal. Judgment and thought content normal.          Assessment & Plan:  Well exam. We discussed diet and exercise. He still has a sinusitis, so we will treat with Ceftin 500 mg bid. For sleep he will get back on Trazodone, but we will increase the dose to 100 mg qhs. He is due for another colonoscopy so we will set this up.  Alysia Penna, MD

## 2016-02-27 ENCOUNTER — Encounter: Payer: Self-pay | Admitting: Internal Medicine

## 2016-03-02 ENCOUNTER — Other Ambulatory Visit: Payer: Self-pay | Admitting: Urology

## 2016-03-02 DIAGNOSIS — N200 Calculus of kidney: Secondary | ICD-10-CM

## 2016-03-04 ENCOUNTER — Ambulatory Visit: Payer: Medicare HMO | Admitting: Pulmonary Disease

## 2016-03-05 ENCOUNTER — Other Ambulatory Visit: Payer: Self-pay | Admitting: Urology

## 2016-03-06 ENCOUNTER — Encounter: Payer: Self-pay | Admitting: Family Medicine

## 2016-03-06 DIAGNOSIS — M1990 Unspecified osteoarthritis, unspecified site: Secondary | ICD-10-CM

## 2016-03-06 MED ORDER — ZOLPIDEM TARTRATE 10 MG PO TABS: ORAL_TABLET | ORAL | 5 refills | Status: DC

## 2016-03-06 NOTE — Telephone Encounter (Signed)
I took the Trazodone off his list. I printed out a new rx for Zolpidem. Other than that I am not sure what else to suggest

## 2016-03-09 DIAGNOSIS — L400 Psoriasis vulgaris: Secondary | ICD-10-CM | POA: Diagnosis not present

## 2016-03-09 DIAGNOSIS — D235 Other benign neoplasm of skin of trunk: Secondary | ICD-10-CM | POA: Diagnosis not present

## 2016-03-09 DIAGNOSIS — L821 Other seborrheic keratosis: Secondary | ICD-10-CM | POA: Diagnosis not present

## 2016-03-09 DIAGNOSIS — L814 Other melanin hyperpigmentation: Secondary | ICD-10-CM | POA: Diagnosis not present

## 2016-03-09 NOTE — Telephone Encounter (Signed)
I called in new script to [facility], left a voice message for pt and sent a my chart message with this information, also I did cancel any remaining refills left on Trazodone.

## 2016-03-11 NOTE — Telephone Encounter (Signed)
I did the referral 

## 2016-03-12 ENCOUNTER — Encounter: Payer: Self-pay | Admitting: Family Medicine

## 2016-03-19 ENCOUNTER — Telehealth: Payer: Self-pay

## 2016-03-19 NOTE — Telephone Encounter (Signed)
Received PA request for Zolpidem 10 mg tablets. Pa submitted & is pending. Key: [id number]

## 2016-03-20 ENCOUNTER — Encounter: Payer: Self-pay | Admitting: Family Medicine

## 2016-03-20 NOTE — Telephone Encounter (Signed)
PA approved, form faxed back to pharmacy. 

## 2016-03-23 NOTE — Telephone Encounter (Signed)
Call in Ceftin 500 mg bid for 10 days  

## 2016-03-24 ENCOUNTER — Other Ambulatory Visit: Payer: Self-pay | Admitting: Family Medicine

## 2016-03-24 MED ORDER — CEFUROXIME AXETIL 500 MG PO TABS: 500.0000 mg | ORAL_TABLET | Freq: Two times a day (BID) | ORAL | 0 refills | Status: DC

## 2016-03-30 ENCOUNTER — Encounter: Payer: Self-pay | Admitting: Family Medicine

## 2016-04-01 ENCOUNTER — Ambulatory Visit (INDEPENDENT_AMBULATORY_CARE_PROVIDER_SITE_OTHER): Payer: PPO | Admitting: Orthopedic Surgery

## 2016-04-01 ENCOUNTER — Other Ambulatory Visit: Payer: Self-pay | Admitting: Family Medicine

## 2016-04-01 ENCOUNTER — Encounter (INDEPENDENT_AMBULATORY_CARE_PROVIDER_SITE_OTHER): Payer: Self-pay | Admitting: Orthopedic Surgery

## 2016-04-01 VITALS — BP 146/92 | HR 86 | Ht 75.0 in | Wt 220.0 lb

## 2016-04-01 DIAGNOSIS — M1711 Unilateral primary osteoarthritis, right knee: Secondary | ICD-10-CM

## 2016-04-01 MED ORDER — LIDOCAINE HCL 1 % IJ SOLN
3.0000 mL | INTRAMUSCULAR | Status: AC | PRN
  Administered 2016-04-01: 3 mL

## 2016-04-01 MED ORDER — SODIUM HYALURONATE (VISCOSUP) 20 MG/2ML IX SOSY
20.0000 mg | PREFILLED_SYRINGE | INTRA_ARTICULAR | Status: AC | PRN
  Administered 2016-04-01: 20 mg via INTRA_ARTICULAR

## 2016-04-01 MED ORDER — GABAPENTIN 100 MG PO CAPS: 100.0000 mg | ORAL_CAPSULE | Freq: Four times a day (QID) | ORAL | 1 refills | Status: DC

## 2016-04-01 NOTE — Telephone Encounter (Signed)
Refill request for Gabapentin 100 mg take 1 po qid and a 90 day supply to [facility].
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
## 2016-04-01 NOTE — Telephone Encounter (Signed)
I printed script for 90 day supply and faxed to below number.

## 2016-04-06 DIAGNOSIS — M2550 Pain in unspecified joint: Secondary | ICD-10-CM | POA: Diagnosis not present

## 2016-04-06 DIAGNOSIS — Z6829 Body mass index (BMI) 29.0-29.9, adult: Secondary | ICD-10-CM | POA: Diagnosis not present

## 2016-04-06 DIAGNOSIS — L400 Psoriasis vulgaris: Secondary | ICD-10-CM | POA: Diagnosis not present

## 2016-04-06 DIAGNOSIS — E663 Overweight: Secondary | ICD-10-CM | POA: Diagnosis not present

## 2016-04-06 DIAGNOSIS — M7989 Other specified soft tissue disorders: Secondary | ICD-10-CM | POA: Diagnosis not present

## 2016-04-06 DIAGNOSIS — R5383 Other fatigue: Secondary | ICD-10-CM | POA: Diagnosis not present

## 2016-04-08 ENCOUNTER — Telehealth (INDEPENDENT_AMBULATORY_CARE_PROVIDER_SITE_OTHER): Payer: Self-pay | Admitting: Orthopedic Surgery

## 2016-04-08 ENCOUNTER — Ambulatory Visit (INDEPENDENT_AMBULATORY_CARE_PROVIDER_SITE_OTHER): Payer: PPO | Admitting: Orthopedic Surgery

## 2016-04-08 ENCOUNTER — Encounter (INDEPENDENT_AMBULATORY_CARE_PROVIDER_SITE_OTHER): Payer: Self-pay | Admitting: Orthopedic Surgery

## 2016-04-08 VITALS — BP 122/89 | HR 93 | Resp 14 | Ht 74.0 in | Wt 200.0 lb

## 2016-04-08 DIAGNOSIS — M1711 Unilateral primary osteoarthritis, right knee: Secondary | ICD-10-CM | POA: Diagnosis not present

## 2016-04-08 MED ORDER — LIDOCAINE HCL 1 % IJ SOLN
3.0000 mL | INTRAMUSCULAR | Status: AC | PRN
  Administered 2016-04-08: 3 mL

## 2016-04-08 MED ORDER — SODIUM HYALURONATE (VISCOSUP) 20 MG/2ML IX SOSY
20.0000 mg | PREFILLED_SYRINGE | INTRA_ARTICULAR | Status: AC | PRN
  Administered 2016-04-08: 20 mg via INTRA_ARTICULAR

## 2016-04-08 NOTE — Telephone Encounter (Signed)
Patient is requesting Aaron Edelman to please give him a call back today please. (he did not specify what the call was about when I asked him). Thanks

## 2016-04-08 NOTE — Telephone Encounter (Signed)
CALLED HIM,,,,HE ASKED IF ANY STEROIDS IN THE EUFLEXXA INJECTIONS
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
## 2016-04-08 NOTE — Telephone Encounter (Signed)
See note below

## 2016-04-09 NOTE — Patient Instructions (Signed)
Brandon Schroeder  04/09/2016   Your procedure is scheduled on: 04/16/2016    Report to [facility] Main  Entrance  Go to Radiology at 0730am to check in then to Short Stay where the nurses will get you ready for your procedure.  To get to short stay after you check in at Radiology on the First Floor  take [facility]  elevators to 3rd floor   Call this number if you have problems the morning of surgery [phone]   Remember: ONLY 1 PERSON MAY GO WITH YOU TO SHORT STAY TO GET  READY MORNING OF [place].  Do not eat food or drink liquids :After Midnight.     Take these medicines the morning of surgery with A SIP OF WATER: nasacort, Flomax, Sertraline ( Zoloft), Gabapentin ( Neurontin), amlodipine ( norvasc)                                 You may not have any metal on your body including hair pins and              piercings  Do not wear jewelry,  lotions, powders or perfumes, deodorant                          Men may shave face and neck.   Do not bring valuables to the hospital. [place].  Contacts, dentures or bridgework may not be worn into surgery.  Leave suitcase in the car. After surgery it may be brought to your room.                     Please read over the following fact sheets you were given: _____________________________________________________________________             [facility] - Preparing for Surgery Before surgery, you can play an important role.  Because skin is not sterile, your skin needs to be as free of germs as possible.  You can reduce the number of germs on your skin by washing with CHG (chlorahexidine gluconate) soap before surgery.  CHG is an antiseptic cleaner which kills germs and bonds with the skin to continue killing germs even after washing. Please DO NOT use if you have an allergy to CHG or antibacterial soaps.  If your skin becomes reddened/irritated stop using the CHG and  inform your nurse when you arrive at Short Stay. Do not shave (including legs and underarms) for at least 48 hours prior to the first CHG shower.  You may shave your face/neck. Please follow these instructions carefully:  1.  Shower with CHG Soap the night before surgery and the  morning of Surgery.  2.  If you choose to wash your hair, wash your hair first as usual with your  normal  shampoo.  3.  After you shampoo, rinse your hair and body thoroughly to remove the  shampoo.                           4.  Use CHG as you would any other liquid soap.  You can apply chg directly  to the skin and wash  Gently with a scrungie or clean washcloth.  5.  Apply the CHG Soap to your body ONLY FROM THE NECK DOWN.   Do not use on face/ open                           Wound or open sores. Avoid contact with eyes, ears mouth and genitals (private parts).                       Wash face,  Genitals (private parts) with your normal soap.             6.  Wash thoroughly, paying special attention to the area where your surgery  will be performed.  7.  Thoroughly rinse your body with warm water from the neck down.  8.  DO NOT shower/wash with your normal soap after using and rinsing off  the CHG Soap.                9.  Pat yourself dry with a clean towel.            10.  Wear clean pajamas.            11.  Place clean sheets on your bed the night of your first shower and do not  sleep with pets. Day of Surgery : Do not apply any lotions/deodorants the morning of surgery.  Please wear clean clothes to the hospital/surgery center.  FAILURE TO FOLLOW THESE INSTRUCTIONS MAY RESULT IN THE CANCELLATION OF YOUR SURGERY PATIENT SIGNATURE_________________________________  NURSE SIGNATURE__________________________________  ________________________________________________________________________  WHAT IS A BLOOD TRANSFUSION? Blood Transfusion Information  A transfusion is the replacement of blood or  some of its parts. Blood is made up of multiple cells which provide different functions.  Red blood cells carry oxygen and are used for blood loss replacement.  White blood cells fight against infection.  Platelets control bleeding.  Plasma helps clot blood.  Other blood products are available for specialized needs, such as hemophilia or other clotting disorders. BEFORE THE TRANSFUSION  Who gives blood for transfusions?   Healthy volunteers who are fully evaluated to make sure their blood is safe. This is blood bank blood. Transfusion therapy is the safest it has ever been in the practice of medicine. Before blood is taken from a donor, a complete history is taken to make sure that person has no history of diseases nor engages in risky social behavior (examples are intravenous drug use or sexual activity with multiple partners). The donor's travel history is screened to minimize risk of transmitting infections, such as malaria. The donated blood is tested for signs of infectious diseases, such as HIV and hepatitis. The blood is then tested to be sure it is compatible with you in order to minimize the chance of a transfusion reaction. If you or a relative donates blood, this is often done in anticipation of surgery and is not appropriate for emergency situations. It takes many days to process the donated blood. RISKS AND COMPLICATIONS Although transfusion therapy is very safe and saves many lives, the main dangers of transfusion include:   Getting an infectious disease.  Developing a transfusion reaction. This is an allergic reaction to something in the blood you were given. Every precaution is taken to prevent this. The decision to have a blood transfusion has been considered carefully by your caregiver before blood is given. Blood is not given unless the benefits outweigh  the risks. AFTER THE TRANSFUSION  Right after receiving a blood transfusion, you will usually feel much better and more  energetic. This is especially true if your red blood cells have gotten low (anemic). The transfusion raises the level of the red blood cells which carry oxygen, and this usually causes an energy increase.  The nurse administering the transfusion will monitor you carefully for complications. HOME CARE INSTRUCTIONS  No special instructions are needed after a transfusion. You may find your energy is better. Speak with your caregiver about any limitations on activity for underlying diseases you may have. SEEK MEDICAL CARE IF:   Your condition is not improving after your transfusion.  You develop redness or irritation at the intravenous (IV) site. SEEK IMMEDIATE MEDICAL CARE IF:  Any of the following symptoms occur over the next 12 hours:  Shaking chills.  You have a temperature by mouth above 102 F (38.9 C), not controlled by medicine.  Chest, back, or muscle pain.  People around you feel you are not acting correctly or are confused.  Shortness of breath or difficulty breathing.  Dizziness and fainting.  You get a rash or develop hives.  You have a decrease in urine output.  Your urine turns a dark color or changes to pink, red, or brown. Any of the following symptoms occur over the next 10 days:  You have a temperature by mouth above 102 F (38.9 C), not controlled by medicine.  Shortness of breath.  Weakness after normal activity.  The white part of the eye turns yellow (jaundice).  You have a decrease in the amount of urine or are urinating less often.  Your urine turns a dark color or changes to pink, red, or brown. Document Released: 01/31/2000 Document Revised: 04/27/2011 Document Reviewed: 09/19/2007 [facility] Patient Information 2014 [place], [place].  _______________________________________________________________________

## 2016-04-10 ENCOUNTER — Encounter (HOSPITAL_COMMUNITY)
Admission: RE | Admit: 2016-04-10 | Discharge: 2016-04-10 | Disposition: A | Discharge status: Home / Self Care | Payer: PPO | Source: Ambulatory Visit | Attending: Urology | Admitting: Urology

## 2016-04-10 ENCOUNTER — Encounter (HOSPITAL_COMMUNITY): Payer: Self-pay

## 2016-04-10 DIAGNOSIS — M1711 Unilateral primary osteoarthritis, right knee: Secondary | ICD-10-CM | POA: Diagnosis not present

## 2016-04-10 DIAGNOSIS — Z01810 Encounter for preprocedural cardiovascular examination: Secondary | ICD-10-CM | POA: Insufficient documentation

## 2016-04-10 DIAGNOSIS — Z01812 Encounter for preprocedural laboratory examination: Secondary | ICD-10-CM | POA: Diagnosis not present

## 2016-04-10 HISTORY — DX: Other specified postprocedural states: R11.2

## 2016-04-10 HISTORY — DX: Other specified postprocedural states: Z98.890

## 2016-04-10 LAB — ABO/RH: ABO/RH(D): A NEG

## 2016-04-13 DIAGNOSIS — N200 Calculus of kidney: Secondary | ICD-10-CM | POA: Diagnosis not present

## 2016-04-13 NOTE — Progress Notes (Signed)
Final EKG done 04/10/16- epic  

## 2016-04-14 ENCOUNTER — Other Ambulatory Visit: Payer: Self-pay | Admitting: Radiology

## 2016-04-15 ENCOUNTER — Other Ambulatory Visit: Payer: Self-pay | Admitting: Radiology

## 2016-04-15 ENCOUNTER — Ambulatory Visit (INDEPENDENT_AMBULATORY_CARE_PROVIDER_SITE_OTHER): Payer: PPO | Admitting: Orthopedic Surgery

## 2016-04-15 DIAGNOSIS — M1711 Unilateral primary osteoarthritis, right knee: Secondary | ICD-10-CM | POA: Diagnosis not present

## 2016-04-15 MED ORDER — SODIUM HYALURONATE (VISCOSUP) 20 MG/2ML IX SOSY
20.0000 mg | PREFILLED_SYRINGE | INTRA_ARTICULAR | Status: AC | PRN
  Administered 2016-04-15: 20 mg via INTRA_ARTICULAR

## 2016-04-15 MED ORDER — LIDOCAINE HCL 1 % IJ SOLN
3.0000 mL | INTRAMUSCULAR | Status: AC | PRN
  Administered 2016-04-15: 3 mL
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
## 2016-04-15 NOTE — H&P (Signed)
HPI: Brandon Schroeder is a 71 year-old male established patient who is here for renal calculi.  He is schedule for a right PCNL on Thursday of this week for a RLP staghorn calculus. Discussion of need for surgery held back in January. He returns today for preoperative history and physical. Denies exacerbation of any bothersome lower urinary tract symptoms. He has not had any gross hematuria. Denies any right-sided flank or back pain. No recent fevers or abx treatment.   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 He is scheduled to have that performed this week prior to his surgery.   The problem is on the right side. He is not currently having flank pain, back pain, groin pain, nausea, vomiting, fever or chills. He has not caught a stone in his urine strainer since his symptoms began.     ALLERGIES: Penicillins    MEDICATIONS: Align  Ambien 5 mg tablet Oral  Aspirin Ec 81 mg tablet, delayed release Oral  Calcium  Euflexxa 20 mg/2 ml (10 mg/ml) (mw 2.4-3.6 million daltons) syringe  Lotrel 10 mg-40 mg capsule Oral  Multivitamin  Zoloft 25 mg tablet Oral     GU PSH: Cystoscopy Insert Stent - 01/22/2014 Renal ESWL - 01/22/2014      PSH Notes: Lithotripsy, Cystoscopy With Insertion Of Ureteral Stent, Closed Treatment Of A Single Rib Fracture, Open Treatment Of Clavicular Fracture, Rhinologic Surgery, Cataract Surgery, Tonsillectomy, Nose Surgery   NON-GU PSH: Eye Surgery (Unspecified), Right, retinal detachment - 09/24/2014 Hernia Repair - 08/24/2014 Nose Surgery (Unspecified) - 01/22/2014 Remove Tonsils - 2008 Treat Clavicle Fracture - 01/22/2014    GU PMH: Elevated PSA (Worsening), His PSA is up to 6.03 but his exam remains benign and he had a negative biopsy about 2 years ago for a PSA of 5.03. I am going to repeat the PSA today and if it is still going up I will consider and prostate MRI and repeat biopsy. - 02/24/2016, Elevated prostate specific antigen (PSA), -  01/31/2014 Kidney Stone, Calculus of kidney - 07/23/2014, Nephrolithiasis, - 2014 Urinary Tract Inf, Unspec site, Urinary tract infection - 07/23/2014 BPH w/LUTS, Benign prostatic hyperplasia with urinary obstruction - 01/22/2014 Disorder Kidney/ureter, Unspec, Renal insufficiency - 01/22/2014 Other microscopic hematuria, Microscopic hematuria - 01/22/2014 Urinary Hesitancy, Hesitancy - 01/22/2014 Hematuria, Unspec, Hematuria - 2014 Obstructive and reflux uropathy, Unspec, Obstructive uropathy - 2014 Personal Hx urinary calculi, Nephrolithiasis - 2014    NON-GU PMH: Encounter for general adult medical examination without abnormal findings, Encounter for preventive health examination - 07/23/2014 Anxiety, Anxiety - 01/22/2014 Hereditary and idiopathic neuropathy, unspecified, Neuropathy, idiopathic - 01/22/2014 Personal history of diseases of the skin and subcutaneous tissue, History of psoriasis - 01/22/2014 Personal history of other diseases of the circulatory system, History of hypertension - 01/22/2014, History of hypertension, - 2014 Personal history of other diseases of the digestive system, History of esophageal reflux - 01/22/2014 Personal history of other diseases of the musculoskeletal system and connective tissue, History of arthritis - 01/22/2014 Personal history of other diseases of the nervous system and sense organs, History of sleep apnea - 01/22/2014    FAMILY HISTORY: Alzheimer's Disease - Runs In Family Death In The Family Father - Runs In Family Family Health Status Number - Runs In Family Hematuria - Runs In Family Kidney Stones - Runs In Family malignant neoplasm - Runs In Family nephrolithiasis - Runs In Family pulmonary fibrosis - Runs In Family   SOCIAL HISTORY: Marital Status: Married Current  Smoking Status: Patient has never smoked.   Tobacco Use Assessment Completed: Used Tobacco in last 30 days? Drinks 2 caffeinated drinks per day.     Notes: Retired, Number of  children, Never a smoker, Father deceased, Alcohol use, Occupation, Mother deceased, Caffeine use, Married, Alcohol Use, Occupation:, Marital History - Currently Married, Tobacco Use, Caffeine Use   REVIEW OF SYSTEMS:    GU Review Male:   Patient denies frequent urination, hard to postpone urination, burning/ pain with urination, get up at night to urinate, leakage of urine, stream starts and stops, trouble starting your stream, have to strain to urinate , erection problems, and penile pain.  Gastrointestinal (Upper):   Patient denies nausea, vomiting, and indigestion/ heartburn.  Gastrointestinal (Lower):   Patient denies diarrhea and constipation.  Constitutional:   Patient denies fever, night sweats, weight loss, and fatigue.  Skin:   Patient reports skin rash/ lesion. Patient denies itching.  Eyes:   Patient denies blurred vision and double vision.  Ears/ Nose/ Throat:   Patient denies sore throat and sinus problems.  Hematologic/Lymphatic:   Patient denies swollen glands and easy bruising.  Cardiovascular:   Patient denies leg swelling and chest pains.  Respiratory:   Patient denies cough and shortness of breath.  Endocrine:   Patient denies excessive thirst.  Musculoskeletal:   Patient reports joint pain. Patient denies back pain.  Neurological:   Patient denies headaches and dizziness.  Psychologic:   Patient denies depression and anxiety.   VITAL SIGNS:      04/13/2016 01:39 PM  Weight 216 lb / 97.98 kg  Height 75 in / 190.5 cm  BP 144/90 mmHg  Heart Rate 69 /min  BMI 27.0 kg/m   MULTI-SYSTEM PHYSICAL EXAMINATION:    Constitutional: Well-nourished. No physical deformities. Normally developed. Good grooming.  Neck: Neck symmetrical, not swollen. Normal tracheal position.  Respiratory: No labored breathing, no use of accessory muscles. CTA  Cardiovascular: Normal temperature, RRR without murmur.  Skin: No paleness, no jaundice, no cyanosis. No lesion, no ulcer, no rash.   Neurologic / Psychiatric: Oriented to time, oriented to place, oriented to person. No depression, no anxiety, no agitation.  Gastrointestinal: No mass, no tenderness, no rigidity, non obese abdomen. No palpable flank or CVA tenderness.  Musculoskeletal: Spine, ribs, pelvis no bilateral tenderness. Normal gait and station of head and neck.     PAST DATA REVIEWED:  Source Of History:  Patient  Records Review:   Previous Patient Records  Urine Test Review:   Urinalysis  X-Ray Review: KUB: Reviewed Films.     02/24/16 02/19/16 12/12/13 08/31/13 04/28/12 03/18/11 09/10/06  PSA  Total PSA 4.58 ng/dl 6.03 ng/dl 5.04 ng/dl 4.25 ng/dl 3.53 ng/dl 1.84 ng/dl 2.40   Free PSA 1.26 ng/dl        % Free PSA 28 %          04/13/16  Urinalysis  Urine Appearance Clear   Urine Color Yellow   Urine Glucose Neg   Urine Bilirubin Neg   Urine Ketones Neg   Urine Specific Gravity 1.015   Urine Blood Neg   Urine pH 6.5   Urine Protein Neg   Urine Urobilinogen 0.2   Urine Nitrites Neg   Urine Leukocyte Esterase Neg    PROCEDURES: None   ASSESSMENT:      ICD-10 Details  1 GU:   Kidney Stone - N20.0    PLAN:            Medications Stop Meds:  Doxycycline Hyclate 100 mg tablet 1 tablet PO Daily  Discontinue: 04/13/2016  - Reason: The medication cycle was completed.            Orders Labs Urine Culture and Sensitivity          Schedule Return Visit/Planned Activity: Keep Scheduled Appointment - Schedule Surgery          Document Letter(s):  Created for Patient: Clinical Summary         Notes:   I see noComplications that wound necessitate not proceeding with PCNL on Thursday of this week. Physical exam within normal limits. I asked him to let the anesthesiologist and Dr. Jeffie Pollock know about the euflexxa injections he has been receiving in his knee but I don't feel that that would be a reason to delay surgery. Urinalysis sent for culture. All questions answered to the best of my ability.

## 2016-04-16 ENCOUNTER — Ambulatory Visit (HOSPITAL_COMMUNITY)
Admission: RE | Admit: 2016-04-16 | Discharge: 2016-04-16 | Disposition: A | Discharge status: Home / Self Care | Payer: PPO | Source: Ambulatory Visit | Attending: Urology | Admitting: Urology

## 2016-04-16 ENCOUNTER — Ambulatory Visit (HOSPITAL_COMMUNITY): Payer: PPO

## 2016-04-16 ENCOUNTER — Ambulatory Visit (HOSPITAL_COMMUNITY): Payer: PPO | Admitting: Anesthesiology

## 2016-04-16 ENCOUNTER — Encounter (HOSPITAL_COMMUNITY): Payer: Self-pay | Admitting: *Deleted

## 2016-04-16 ENCOUNTER — Observation Stay (HOSPITAL_COMMUNITY)
Admission: RE | Admit: 2016-04-16 | Discharge: 2016-04-17 | Disposition: A | Discharge status: Home / Self Care | Payer: PPO | Source: Ambulatory Visit | Attending: Urology | Admitting: Urology

## 2016-04-16 ENCOUNTER — Encounter (HOSPITAL_COMMUNITY)
Admission: RE | Disposition: A | Discharge status: Home / Self Care | Payer: Self-pay | Source: Ambulatory Visit | Attending: Urology

## 2016-04-16 DIAGNOSIS — G4730 Sleep apnea, unspecified: Secondary | ICD-10-CM | POA: Diagnosis not present

## 2016-04-16 DIAGNOSIS — F419 Anxiety disorder, unspecified: Secondary | ICD-10-CM | POA: Diagnosis not present

## 2016-04-16 DIAGNOSIS — Z86010 Personal history of colonic polyps: Secondary | ICD-10-CM | POA: Diagnosis not present

## 2016-04-16 DIAGNOSIS — G5790 Unspecified mononeuropathy of unspecified lower limb: Secondary | ICD-10-CM | POA: Diagnosis not present

## 2016-04-16 DIAGNOSIS — N200 Calculus of kidney: Secondary | ICD-10-CM | POA: Diagnosis not present

## 2016-04-16 DIAGNOSIS — G2581 Restless legs syndrome: Secondary | ICD-10-CM | POA: Insufficient documentation

## 2016-04-16 DIAGNOSIS — F329 Major depressive disorder, single episode, unspecified: Secondary | ICD-10-CM | POA: Diagnosis not present

## 2016-04-16 DIAGNOSIS — K219 Gastro-esophageal reflux disease without esophagitis: Secondary | ICD-10-CM | POA: Insufficient documentation

## 2016-04-16 DIAGNOSIS — I10 Essential (primary) hypertension: Secondary | ICD-10-CM | POA: Insufficient documentation

## 2016-04-16 DIAGNOSIS — Z91030 Bee allergy status: Secondary | ICD-10-CM | POA: Insufficient documentation

## 2016-04-16 DIAGNOSIS — G4700 Insomnia, unspecified: Secondary | ICD-10-CM | POA: Insufficient documentation

## 2016-04-16 DIAGNOSIS — M170 Bilateral primary osteoarthritis of knee: Secondary | ICD-10-CM | POA: Insufficient documentation

## 2016-04-16 DIAGNOSIS — Z881 Allergy status to other antibiotic agents status: Secondary | ICD-10-CM | POA: Diagnosis not present

## 2016-04-16 DIAGNOSIS — L409 Psoriasis, unspecified: Secondary | ICD-10-CM | POA: Diagnosis not present

## 2016-04-16 DIAGNOSIS — Z7982 Long term (current) use of aspirin: Secondary | ICD-10-CM | POA: Diagnosis not present

## 2016-04-16 DIAGNOSIS — D696 Thrombocytopenia, unspecified: Secondary | ICD-10-CM | POA: Diagnosis not present

## 2016-04-16 DIAGNOSIS — Z880 Allergy status to penicillin: Secondary | ICD-10-CM | POA: Insufficient documentation

## 2016-04-16 DIAGNOSIS — Z888 Allergy status to other drugs, medicaments and biological substances status: Secondary | ICD-10-CM | POA: Diagnosis not present

## 2016-04-16 DIAGNOSIS — Z87442 Personal history of urinary calculi: Secondary | ICD-10-CM | POA: Insufficient documentation

## 2016-04-16 HISTORY — PX: NEPHROLITHOTOMY: SHX5134

## 2016-04-16 HISTORY — PX: IR GENERIC HISTORICAL: IMG1180011

## 2016-04-16 LAB — BASIC METABOLIC PANEL
Anion gap: 7 (ref 5–15)
BUN: 19 mg/dL (ref 6–20)
CO2: 32 mmol/L (ref 22–32)
CREATININE: 1.2 mg/dL (ref 0.61–1.24)
Calcium: 9.7 mg/dL (ref 8.9–10.3)
Chloride: 102 mmol/L (ref 101–111)
GFR calc non Af Amer: 60 mL/min — ABNORMAL LOW (ref >60)
Glucose, Bld: 117 mg/dL — ABNORMAL HIGH (ref 65–99)
Potassium: 3.3 mmol/L — ABNORMAL LOW (ref 3.5–5.1)
SODIUM: 141 mmol/L (ref 135–145)

## 2016-04-16 LAB — CBC WITH DIFFERENTIAL/PLATELET
BASOS PCT: 0 %
Basophils Absolute: 0 10*3/uL (ref 0.0–0.1)
EOS ABS: 0.1 10*3/uL (ref 0.0–0.7)
Eosinophils Relative: 1 %
HCT: 40.1 % (ref 39.0–52.0)
HEMOGLOBIN: 14 g/dL (ref 13.0–17.0)
Lymphocytes Relative: 25 %
Lymphs Abs: 1.4 10*3/uL (ref 0.7–4.0)
MCH: 32.3 pg (ref 26.0–34.0)
MCHC: 34.9 g/dL (ref 30.0–36.0)
MCV: 92.4 fL (ref 78.0–100.0)
MONOS PCT: 10 %
Monocytes Absolute: 0.6 10*3/uL (ref 0.1–1.0)
NEUTROS PCT: 64 %
Neutro Abs: 3.6 10*3/uL (ref 1.7–7.7)
PLATELETS: 126 10*3/uL — AB (ref 150–400)
RBC: 4.34 MIL/uL (ref 4.22–5.81)
RDW: 13.3 % (ref 11.5–15.5)
WBC: 5.6 10*3/uL (ref 4.0–10.5)

## 2016-04-16 LAB — TYPE AND SCREEN
ABO/RH(D): A NEG
ANTIBODY SCREEN: NEGATIVE

## 2016-04-16 LAB — PROTIME-INR
INR: 0.98
PROTHROMBIN TIME: 13 s (ref 11.4–15.2)

## 2016-04-16 LAB — HEMOGLOBIN AND HEMATOCRIT, BLOOD
HCT: 38.9 % — ABNORMAL LOW (ref 39.0–52.0)
Hemoglobin: 13.5 g/dL (ref 13.0–17.0)

## 2016-04-16 SURGERY — NEPHROLITHOTOMY PERCUTANEOUS
Anesthesia: General | Laterality: Right

## 2016-04-16 MED ORDER — LACTATED RINGERS IV SOLN
INTRAVENOUS | Status: DC
  Administered 2016-04-16: 09:00:00 via INTRAVENOUS

## 2016-04-16 MED ORDER — DEXAMETHASONE SODIUM PHOSPHATE 10 MG/ML IJ SOLN
INTRAMUSCULAR | Status: DC | PRN
  Administered 2016-04-16: 10 mg via INTRAVENOUS

## 2016-04-16 MED ORDER — FLUTICASONE PROPIONATE 50 MCG/ACT NA SUSP
1.0000 | Freq: Every evening | NASAL | Status: DC
  Filled 2016-04-16: qty 16

## 2016-04-16 MED ORDER — OXYCODONE HCL 5 MG PO TABS: 5.0000 mg | ORAL_TABLET | Freq: Four times a day (QID) | ORAL | 0 refills | Status: DC | PRN

## 2016-04-16 MED ORDER — GENTAMICIN SULFATE 40 MG/ML IJ SOLN
460.0000 mg | Freq: Once | INTRAVENOUS | Status: AC
  Administered 2016-04-16: 460 mg via INTRAVENOUS
  Filled 2016-04-16: qty 11.5

## 2016-04-16 MED ORDER — PROPOFOL 10 MG/ML IV BOLUS
INTRAVENOUS | Status: DC | PRN
  Administered 2016-04-16: 150 mg via INTRAVENOUS

## 2016-04-16 MED ORDER — IOPAMIDOL (ISOVUE-300) INJECTION 61%
INTRAVENOUS | Status: AC
  Administered 2016-04-16: 10 mL
  Filled 2016-04-16: qty 50

## 2016-04-16 MED ORDER — OXYCODONE HCL 5 MG PO TABS: 5.0000 mg | ORAL_TABLET | ORAL | Status: DC | PRN

## 2016-04-16 MED ORDER — SODIUM CHLORIDE 0.9 % IV SOLN: INTRAVENOUS | Status: DC

## 2016-04-16 MED ORDER — SODIUM CHLORIDE 0.9 % IR SOLN
Status: DC | PRN
  Administered 2016-04-16: 15000 mL

## 2016-04-16 MED ORDER — SUGAMMADEX SODIUM 200 MG/2ML IV SOLN
INTRAVENOUS | Status: AC
  Filled 2016-04-16: qty 2

## 2016-04-16 MED ORDER — FENTANYL CITRATE (PF) 100 MCG/2ML IJ SOLN
INTRAMUSCULAR | Status: AC
  Filled 2016-04-16: qty 2

## 2016-04-16 MED ORDER — MIDAZOLAM HCL 2 MG/2ML IJ SOLN
INTRAMUSCULAR | Status: AC
  Filled 2016-04-16: qty 6

## 2016-04-16 MED ORDER — LIDOCAINE 2% (20 MG/ML) 5 ML SYRINGE
INTRAMUSCULAR | Status: AC
  Filled 2016-04-16: qty 5

## 2016-04-16 MED ORDER — POTASSIUM CHLORIDE IN NACL 20-0.45 MEQ/L-% IV SOLN
INTRAVENOUS | Status: DC
  Administered 2016-04-16: 16:00:00 via INTRAVENOUS
  Filled 2016-04-16 (×2): qty 1000

## 2016-04-16 MED ORDER — LIDOCAINE HCL 1 % IJ SOLN
INTRAMUSCULAR | Status: AC | PRN
  Administered 2016-04-16: 10 mL

## 2016-04-16 MED ORDER — LORATADINE 10 MG PO TABS
10.0000 mg | ORAL_TABLET | Freq: Every day | ORAL | Status: DC
  Administered 2016-04-16: 10 mg via ORAL
  Filled 2016-04-16: qty 1

## 2016-04-16 MED ORDER — MIDAZOLAM HCL 2 MG/2ML IJ SOLN
INTRAMUSCULAR | Status: AC | PRN
  Administered 2016-04-16 (×5): 1 mg via INTRAVENOUS

## 2016-04-16 MED ORDER — ONDANSETRON HCL 4 MG/2ML IJ SOLN: 4.0000 mg | INTRAMUSCULAR | Status: DC | PRN

## 2016-04-16 MED ORDER — GABAPENTIN 100 MG PO CAPS
200.0000 mg | ORAL_CAPSULE | Freq: Two times a day (BID) | ORAL | Status: DC
  Administered 2016-04-16: 200 mg via ORAL
  Filled 2016-04-16: qty 2

## 2016-04-16 MED ORDER — FLEET ENEMA 7-19 GM/118ML RE ENEM: 1.0000 | ENEMA | Freq: Once | RECTAL | Status: DC | PRN

## 2016-04-16 MED ORDER — HYDROCHLOROTHIAZIDE 25 MG PO TABS
25.0000 mg | ORAL_TABLET | Freq: Every day | ORAL | Status: DC
  Administered 2016-04-16: 25 mg via ORAL
  Filled 2016-04-16: qty 1

## 2016-04-16 MED ORDER — HYOSCYAMINE SULFATE 0.125 MG SL SUBL
0.1250 mg | SUBLINGUAL_TABLET | SUBLINGUAL | Status: DC | PRN
Start: 2016-04-16 — End: 2016-04-17
  Filled 2016-04-16: qty 1

## 2016-04-16 MED ORDER — VANCOMYCIN HCL IN DEXTROSE 1-5 GM/200ML-% IV SOLN
1000.0000 mg | INTRAVENOUS | Status: AC
  Administered 2016-04-16: 1000 mg via INTRAVENOUS
  Filled 2016-04-16 (×2): qty 200

## 2016-04-16 MED ORDER — FENTANYL CITRATE (PF) 100 MCG/2ML IJ SOLN
INTRAMUSCULAR | Status: AC | PRN
  Administered 2016-04-16: 25 ug via INTRAVENOUS
  Administered 2016-04-16: 50 ug via INTRAVENOUS
  Administered 2016-04-16: 25 ug via INTRAVENOUS

## 2016-04-16 MED ORDER — HYDROMORPHONE HCL 1 MG/ML IJ SOLN
0.5000 mg | INTRAMUSCULAR | Status: DC | PRN
  Administered 2016-04-16 – 2016-04-17 (×3): 0.5 mg via INTRAVENOUS
  Filled 2016-04-16 (×3): qty 0.5

## 2016-04-16 MED ORDER — MEPERIDINE HCL 50 MG/ML IJ SOLN: 6.2500 mg | INTRAMUSCULAR | Status: DC | PRN

## 2016-04-16 MED ORDER — IOHEXOL 300 MG/ML  SOLN
INTRAMUSCULAR | Status: DC | PRN
  Administered 2016-04-16: 50 mL

## 2016-04-16 MED ORDER — FENTANYL CITRATE (PF) 100 MCG/2ML IJ SOLN
INTRAMUSCULAR | Status: DC | PRN
  Administered 2016-04-16: 100 ug via INTRAVENOUS

## 2016-04-16 MED ORDER — EPHEDRINE 5 MG/ML INJ
INTRAVENOUS | Status: AC
  Filled 2016-04-16: qty 10

## 2016-04-16 MED ORDER — EPHEDRINE SULFATE 50 MG/ML IJ SOLN
INTRAMUSCULAR | Status: DC | PRN
  Administered 2016-04-16 (×2): 5 mg via INTRAVENOUS

## 2016-04-16 MED ORDER — LIDOCAINE HCL 1 % IJ SOLN
INTRAMUSCULAR | Status: AC
  Filled 2016-04-16: qty 20

## 2016-04-16 MED ORDER — ROCURONIUM BROMIDE 100 MG/10ML IV SOLN: INTRAVENOUS | Status: DC | PRN

## 2016-04-16 MED ORDER — PROPOFOL 10 MG/ML IV BOLUS
INTRAVENOUS | Status: AC
  Filled 2016-04-16: qty 20

## 2016-04-16 MED ORDER — METOCLOPRAMIDE HCL 5 MG/ML IJ SOLN: 10.0000 mg | Freq: Once | INTRAMUSCULAR | Status: DC | PRN

## 2016-04-16 MED ORDER — FLUOCINONIDE-E 0.05 % EX CREA
1.0000 "application " | TOPICAL_CREAM | Freq: Two times a day (BID) | CUTANEOUS | Status: DC
  Filled 2016-04-16: qty 15

## 2016-04-16 MED ORDER — SERTRALINE HCL 100 MG PO TABS
100.0000 mg | ORAL_TABLET | Freq: Two times a day (BID) | ORAL | Status: DC
  Administered 2016-04-16: 100 mg via ORAL
  Filled 2016-04-16: qty 1

## 2016-04-16 MED ORDER — ONDANSETRON HCL 4 MG/2ML IJ SOLN
INTRAMUSCULAR | Status: DC | PRN
  Administered 2016-04-16: 4 mg via INTRAVENOUS

## 2016-04-16 MED ORDER — ROCURONIUM BROMIDE 50 MG/5ML IV SOSY
PREFILLED_SYRINGE | INTRAVENOUS | Status: AC
  Filled 2016-04-16: qty 5

## 2016-04-16 MED ORDER — CIPROFLOXACIN IN D5W 400 MG/200ML IV SOLN: 400.0000 mg | INTRAVENOUS | Status: DC

## 2016-04-16 MED ORDER — LIDOCAINE HCL (CARDIAC) 20 MG/ML IV SOLN
INTRAVENOUS | Status: DC | PRN
  Administered 2016-04-16: 60 mg via INTRAVENOUS

## 2016-04-16 MED ORDER — SUGAMMADEX SODIUM 200 MG/2ML IV SOLN
INTRAVENOUS | Status: DC | PRN
  Administered 2016-04-16: 200 mg via INTRAVENOUS

## 2016-04-16 MED ORDER — AMLODIPINE BESYLATE 10 MG PO TABS: 10.0000 mg | ORAL_TABLET | Freq: Every day | ORAL | Status: DC

## 2016-04-16 MED ORDER — HYDROMORPHONE HCL 1 MG/ML IJ SOLN
0.2500 mg | INTRAMUSCULAR | Status: DC | PRN
  Administered 2016-04-16 (×2): 0.25 mg via INTRAVENOUS

## 2016-04-16 MED ORDER — ONDANSETRON HCL 4 MG/2ML IJ SOLN
INTRAMUSCULAR | Status: AC
  Filled 2016-04-16: qty 2

## 2016-04-16 MED ORDER — DEXAMETHASONE SODIUM PHOSPHATE 10 MG/ML IJ SOLN
INTRAMUSCULAR | Status: AC
  Filled 2016-04-16: qty 1

## 2016-04-16 MED ORDER — BISACODYL 10 MG RE SUPP: 10.0000 mg | Freq: Every day | RECTAL | Status: DC | PRN

## 2016-04-16 MED ORDER — ACETAMINOPHEN 325 MG PO TABS
650.0000 mg | ORAL_TABLET | ORAL | Status: DC | PRN
  Administered 2016-04-16: 650 mg via ORAL
  Filled 2016-04-16: qty 2

## 2016-04-16 MED ORDER — FENTANYL CITRATE (PF) 100 MCG/2ML IJ SOLN
INTRAMUSCULAR | Status: AC
  Filled 2016-04-16: qty 4

## 2016-04-16 MED ORDER — ROCURONIUM BROMIDE 100 MG/10ML IV SOLN
INTRAVENOUS | Status: DC | PRN
  Administered 2016-04-16: 40 mg via INTRAVENOUS

## 2016-04-16 MED ORDER — TAMSULOSIN HCL 0.4 MG PO CAPS: 0.4000 mg | ORAL_CAPSULE | Freq: Every day | ORAL | Status: DC

## 2016-04-16 MED ORDER — HYDROMORPHONE HCL 1 MG/ML IJ SOLN
INTRAMUSCULAR | Status: AC
  Filled 2016-04-16: qty 1

## 2016-04-16 MED ORDER — SENNOSIDES-DOCUSATE SODIUM 8.6-50 MG PO TABS: 1.0000 | ORAL_TABLET | Freq: Every evening | ORAL | Status: DC | PRN

## 2016-04-16 SURGICAL SUPPLY — 50 items
APL SKNCLS STERI-STRIP NONHPOA (GAUZE/BANDAGES/DRESSINGS) ×1
BAG URINE DRAINAGE (UROLOGICAL SUPPLIES) ×3 IMPLANT
BASKET STONE NCOMPASS (UROLOGICAL SUPPLIES) ×2 IMPLANT
BASKET ZERO TIP NITINOL 2.4FR (BASKET) ×1 IMPLANT
BENZOIN TINCTURE PRP APPL 2/3 (GAUZE/BANDAGES/DRESSINGS) ×6 IMPLANT
BLADE SURG 15 STRL LF DISP TIS (BLADE) ×1 IMPLANT
BLADE SURG 15 STRL SS (BLADE) ×3
BSKT STON RTRVL ZERO TP 2.4FR (BASKET)
CARTRIDGE STONEBREAK CO2 KIDNE (ELECTROSURGICAL) IMPLANT
CATCHER STONE W/TUBE ADAPTER (UROLOGICAL SUPPLIES) IMPLANT
CATH AINSWORTH 30CC 24FR (CATHETERS) ×3 IMPLANT
CATH ROBINSON RED A/P 20FR (CATHETERS) IMPLANT
CATH URET 5FR 28IN OPEN ENDED (CATHETERS) IMPLANT
CATH URET DUAL LUMEN 6-10FR 50 (CATHETERS) ×1 IMPLANT
CATH UROLOGY TORQUE 40 (MISCELLANEOUS) ×2 IMPLANT
CATH X-FORCE N30 NEPHROSTOMY (TUBING) ×3 IMPLANT
COVER SURGICAL LIGHT HANDLE (MISCELLANEOUS) ×1 IMPLANT
DRAPE C-ARM 42X120 X-RAY (DRAPES) ×3 IMPLANT
DRAPE LINGEMAN PERC (DRAPES) ×3 IMPLANT
DRAPE SURG IRRIG POUCH 19X23 (DRAPES) ×3 IMPLANT
DRSG PAD ABDOMINAL 8X10 ST (GAUZE/BANDAGES/DRESSINGS) ×4 IMPLANT
DRSG TEGADERM 8X12 (GAUZE/BANDAGES/DRESSINGS) ×4 IMPLANT
FIBER LASER FLEXIVA 1000 (UROLOGICAL SUPPLIES) IMPLANT
FIBER LASER FLEXIVA 365 (UROLOGICAL SUPPLIES) IMPLANT
FIBER LASER FLEXIVA 550 (UROLOGICAL SUPPLIES) IMPLANT
FIBER LASER TRAC TIP (UROLOGICAL SUPPLIES) IMPLANT
GAUZE SPONGE 4X4 12PLY STRL (GAUZE/BANDAGES/DRESSINGS) ×3 IMPLANT
GLOVE SURG SS PI 8.0 STRL IVOR (GLOVE) ×2 IMPLANT
GOWN STRL REUS W/TWL XL LVL3 (GOWN DISPOSABLE) ×3 IMPLANT
GUIDEWIRE AMPLAZ .035X145 (WIRE) ×6 IMPLANT
KIT BASIN OR (CUSTOM PROCEDURE TRAY) ×3 IMPLANT
MANIFOLD NEPTUNE II (INSTRUMENTS) ×3 IMPLANT
MASK EYE SHIELD (GAUZE/BANDAGES/DRESSINGS) ×1 IMPLANT
NS IRRIG 1000ML POUR BTL (IV SOLUTION) ×1 IMPLANT
PACK CYSTO (CUSTOM PROCEDURE TRAY) ×3 IMPLANT
PROBE KIDNEY STONEBRKR 2.0X425 (ELECTROSURGICAL) IMPLANT
PROBE LITHOCLAST ULTRA 3.8X403 (UROLOGICAL SUPPLIES) ×2 IMPLANT
PROBE PNEUMATIC 1.0MMX570MM (UROLOGICAL SUPPLIES) ×2 IMPLANT
SET IRRIG Y TYPE TUR BLADDER L (SET/KITS/TRAYS/PACK) ×3 IMPLANT
SPONGE LAP 4X18 X RAY DECT (DISPOSABLE) ×3 IMPLANT
STONE CATCHER W/TUBE ADAPTER (UROLOGICAL SUPPLIES) ×3 IMPLANT
SUT SILK 2 0 30  PSL (SUTURE) ×2
SUT SILK 2 0 30 PSL (SUTURE) ×1 IMPLANT
SYR 10ML LL (SYRINGE) ×3 IMPLANT
SYR 20CC LL (SYRINGE) ×6 IMPLANT
TOWEL OR 17X26 10 PK STRL BLUE (TOWEL DISPOSABLE) ×3 IMPLANT
TOWEL OR NON WOVEN STRL DISP B (DISPOSABLE) ×3 IMPLANT
TRAY FOLEY W/METER SILVER 16FR (SET/KITS/TRAYS/PACK) ×3 IMPLANT
TUBING CONNECTING 10 (TUBING) ×6 IMPLANT
TUBING CONNECTING 10' (TUBING) ×3

## 2016-04-16 NOTE — Anesthesia Postprocedure Evaluation (Signed)
Anesthesia Post Note  [name].  Procedure(s) Performed: Procedure(s) (LRB): NEPHROLITHOTOMY RIGHT PERCUTANEOUS (Right)  Patient location during evaluation: PACU Anesthesia Type: General Level of consciousness: awake and alert and oriented Pain management: pain level controlled Vital Signs Assessment: post-procedure vital signs reviewed and stable Respiratory status: spontaneous breathing, nonlabored ventilation and respiratory function stable Cardiovascular status: blood pressure returned to baseline and stable Postop Assessment: no signs of nausea or vomiting Anesthetic complications: no       Last Vitals:  Vitals:   04/16/16 1415 04/16/16 1430  BP: 116/69 128/86  Pulse: 74 79  Resp: (!) 7 19  Temp:  36.7 C    Last Pain:  Vitals:   04/16/16 1430  TempSrc:   PainSc: 3                  Dessiree Sze A.

## 2016-04-16 NOTE — Discharge Instructions (Signed)
Percutaneous Nephrolithotomy, Care After °Refer to this sheet in the next few weeks. These instructions provide you with information about caring for yourself after your procedure. Your health care provider may also give you more specific instructions. Your treatment has been planned according to current medical practices, but problems sometimes occur. Call your health care provider if you have any problems or questions after your procedure. °What can I expect after the procedure? °After the procedure, it is common to have: °· Soreness or pain. °· Fatigue. °· Some blood in your urine for a few days. °Follow these instructions at home: °Incision care  ° °· Follow instructions from your health care provider about how to take care of your cut from surgery (incision). Make sure you: °¨ Wash your hands with soap and water before you change your bandage (dressing). If soap and water are not available, use hand sanitizer. °¨ Change your dressing as told by your health care provider. °¨ Leave stitches (sutures), skin glue, or adhesive strips in place. These skin closures may need to stay in place for 2 weeks or longer. If adhesive strip edges start to loosen and curl up, you may trim the loose edges. Do not remove adhesive strips completely unless your health care provider tells you to do that. °· Check your incision area every day for signs of infection. Check for: °¨ More redness, swelling, or pain. °¨ More fluid or blood. °¨ Warmth. °¨ Pus or a bad smell. °Activity  °· Avoid strenuous activities for as long as told by your health care provider. °· Return to your normal activities as told by your health care provider. Ask your health care provider what activities are safe for you. °General instructions  °· Take over-the-counter and prescription medicines only as told by your health care provider. °· Do not drive or operate heavy machinery while taking prescription pain medicine. °· Keep all follow-up visits as told by  your health care provider. This is important. °· You may have been sent home with a catheter or kidney drain tube. If so, carefully follow your health care provider’s instructions on how to take care of your catheter or kidney drain tube. °Contact a health care provider if: °· You have a fever. °· You have more redness, swelling, or pain around your incision. °· You have more fluid or blood coming from your incision. °· Your incision feels warm to the touch. °· You have pus or a bad smell coming from your incision. °· You lose your appetite. °· You feel nauseous or you vomit. °Get help right away if: °· You have blood clots in your urine. °· You cannot urinate. °· You have chest pain or trouble breathing. °This information is not intended to replace advice given to you by your health care provider. Make sure you discuss any questions you have with your health care provider. °Document Released: 03/01/2015 Document Revised: 07/11/2015 Document Reviewed: 07/23/2014 °Elsevier Interactive Patient Education © 2017 [employer] ° °

## 2016-04-16 NOTE — Transfer of Care (Signed)
Immediate Anesthesia Transfer of Care Note  [name].  Procedure(s) Performed: Procedure(s): NEPHROLITHOTOMY RIGHT PERCUTANEOUS (Right)  Patient Location: PACU  Anesthesia Type:General  Level of Consciousness: awake, alert  and oriented  Airway & Oxygen Therapy: Patient Spontanous Breathing and Patient connected to face mask oxygen  Post-op Assessment: Report given to RN and Post -op Vital signs reviewed and stable  Post vital signs: Reviewed and stable  Last Vitals:  Vitals:   04/16/16 0807  BP: 137/85  Pulse: 84  Resp: 16  Temp: 36.7 C    Last Pain:  Vitals:   04/16/16 0807  TempSrc: Oral      Patients Stated Pain Goal: 3 ([id number])  Complications: No apparent anesthesia complications

## 2016-04-16 NOTE — Anesthesia Preprocedure Evaluation (Addendum)
Anesthesia Evaluation  Patient identified by MRN, date of birth, ID band Patient awake    Reviewed: Allergy & Precautions, NPO status , Patient's Chart, lab work & pertinent test results  History of Anesthesia Complications (+) PONV and history of anesthetic complications  Airway Mallampati: II  TM Distance: >3 FB Neck ROM: Full    Dental no notable dental hx. (+) Teeth Intact   Pulmonary shortness of breath and with exertion, sleep apnea and Continuous Positive Airway Pressure Ventilation ,    Pulmonary exam normal breath sounds clear to auscultation       Cardiovascular hypertension, Pt. on medications + DOE  Normal cardiovascular exam Rhythm:Regular Rate:Normal     Neuro/Psych PSYCHIATRIC DISORDERS Anxiety Depression Restless legs syndrome Neuropathy- soles of feet  Neuromuscular disease    GI/Hepatic Neg liver ROS, GERD  Medicated and Controlled,  Endo/Other  negative endocrine ROS  Renal/GU Right Nephrolithiasis  negative genitourinary   Musculoskeletal  (+) Arthritis , Osteoarthritis,  Psoriasis   Abdominal   Peds  Hematology Thrombocytopenia   Anesthesia Other Findings   Reproductive/Obstetrics                             Anesthesia Physical Anesthesia Plan  ASA: II  Anesthesia Plan: General   Post-op Pain Management:    Induction: Intravenous  Airway Management Planned: Oral ETT  Additional Equipment:   Intra-op Plan:   Post-operative Plan: Extubation in OR  Informed Consent: I have reviewed the patients History and Physical, chart, labs and discussed the procedure including the risks, benefits and alternatives for the proposed anesthesia with the patient or authorized representative who has indicated his/her understanding and acceptance.   Dental advisory given  Plan Discussed with: CRNA, Anesthesiologist and Surgeon  Anesthesia Plan Comments:          Anesthesia Quick Evaluation
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
## 2016-04-16 NOTE — Progress Notes (Signed)
Pt has declined use of Hospital provided CPAP for tonight.  Pt to notify RT if he changes his mind.  RT to monitor and assess as needed.

## 2016-04-16 NOTE — Procedures (Signed)
Right antegrade nephroureteral catheter placement No complication No blood loss. See complete dictation in [facility].

## 2016-04-16 NOTE — Anesthesia Procedure Notes (Signed)
Procedure Name: Intubation Date/Time: 04/16/2016 11:56 AM Performed by: Glory Buff Pre-anesthesia Checklist: Patient identified, Emergency Drugs available, Suction available and Patient being monitored Patient Re-evaluated:Patient Re-evaluated prior to inductionOxygen Delivery Method: Circle system utilized Preoxygenation: Pre-oxygenation with 100% oxygen Intubation Type: IV induction Ventilation: Mask ventilation without difficulty Laryngoscope Size: Miller and 3 Grade View: Grade II Tube type: Oral Tube size: 7.5 mm Number of attempts: 1 Airway Equipment and Method: Stylet and Oral airway Placement Confirmation: ETT inserted through vocal cords under direct vision,  positive ETCO2 and breath sounds checked- equal and bilateral Secured at: 23 cm Tube secured with: Tape Dental Injury: Teeth and Oropharynx as per pre-operative assessment

## 2016-04-16 NOTE — Brief Op Note (Signed)
04/16/2016  1:20 PM  PATIENT:  Brandon Schroeder.  71 y.o. male  PRE-OPERATIVE DIAGNOSIS:  RIGHT 3.4CM STAGHORN STONE  POST-OPERATIVE DIAGNOSIS:  RIGHT 3.4CM STAGHORN STONE  PROCEDURE:  Procedure(s): NEPHROLITHOTOMY RIGHT PERCUTANEOUS (Right)  >2cm RIGHT ANTEGRADE NEPHROSTOGRAM PREEXISTING TRACT.  SURGEON:  Surgeon(s) and Role:    * Irine Seal, MD - Primary  PHYSICIAN ASSISTANT:   ASSISTANTS: none   ANESTHESIA:   general  EBL:  No intake/output data recorded. 148ml  BLOOD ADMINISTERED:none  DRAINS: Urinary Catheter (Foley) and 46fr [place] and 84fr right safety catheter   LOCAL MEDICATIONS USED:  NONE  SPECIMEN:  Source of Specimen:  right renal stone  DISPOSITION OF SPECIMEN:  to patient to bring to office for analysis  COUNTS:  YES  TOURNIQUET:  * No tourniquets in log *  DICTATION: .Other Dictation: Dictation Number [id number]  PLAN OF CARE: Admit for overnight observation  PATIENT DISPOSITION:  PACU - hemodynamically stable.   Delay start of Pharmacological VTE agent (>24hrs) due to surgical blood loss or risk of bleeding: yes

## 2016-04-16 NOTE — Interval H&P Note (Signed)
History and Physical Interval Note:  04/16/2016 11:19 AM  Brandon Schroeder.  has presented today for surgery, with the diagnosis of RIGHT 3.4CM STAGHORN STONE  The various methods of treatment have been discussed with the patient and family. After consideration of risks, benefits and other options for treatment, the patient has consented to  Procedure(s): NEPHROLITHOTOMY RIGHT PERCUTANEOUS (Right) as a surgical intervention .  The patient's history has been reviewed, patient examined, no change in status, stable for surgery.  I have reviewed the patient's chart and labs.  Questions were answered to the patient's satisfaction.     Francella Barnett,Tyrus J

## 2016-04-17 ENCOUNTER — Observation Stay (HOSPITAL_COMMUNITY): Payer: PPO

## 2016-04-17 DIAGNOSIS — R935 Abnormal findings on diagnostic imaging of other abdominal regions, including retroperitoneum: Secondary | ICD-10-CM | POA: Diagnosis not present

## 2016-04-17 DIAGNOSIS — N200 Calculus of kidney: Secondary | ICD-10-CM | POA: Diagnosis not present

## 2016-04-17 LAB — HEMOGLOBIN AND HEMATOCRIT, BLOOD
HEMATOCRIT: 36.5 % — AB (ref 39.0–52.0)
HEMOGLOBIN: 12.7 g/dL — AB (ref 13.0–17.0)
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
## 2016-04-17 NOTE — Op Note (Signed)
NAMEMarland Schroeder  YACQUB, CICOTTE NO.:  [id number]  MEDICAL RECORD NO.:  [id number]  LOCATION:                                 FACILITY:  PHYSICIAN:  Xenophon Yongue. Jeffie Pollock, M.D.    DATE OF BIRTH:  1945/12/31  DATE OF PROCEDURE:  04/16/2016 DATE OF DISCHARGE:                              OPERATIVE REPORT   Patient of Dr. Irine Seal.  PROCEDURES: 1. 1st Stage Right percutaneous nephrolithotomy for a 3.4 cm partial staghorn     stone. 2. Antegrade nephrostogram through existing tract.  PREOPERATIVE DIAGNOSIS:  A 3.4 cm right partial staghorn stone.  POSTOPERATIVE DIAGNOSIS:  A 3.4 cm right partial staghorn stone.  SURGEON:  Jenna Steighner. Jeffie Pollock, M.D.  ANESTHESIA:  General.  SPECIMEN:  Stone fragments.  DRAINS:  Foley catheter, 24-French Ainsworth right nephrostomy, and 6- French right nephrostomy.  BLOOD LOSS:  Approximately 100 mL.  COMPLICATIONS:  None.  INDICATIONS:  Mr. Beshara is a 71 year old white male, whom we have been following for a left lower pole stone.  It has gotten to be 3.4 cm partial stag and he has agreed to undergo percutaneous nephrolithotomy for therapy.  FINDINGS AND PROCEDURE:  He was taken to the IR suite earlier today and received vancomycin and gentamicin for preoperative antibiotic prophylaxis.  He underwent successful placement of a lower pole percutaneous access with a 6-French nephrostomy catheter.  He was then taken to the operating room where a general anesthetic was induced with him on the holding room stretcher.  A 16-French Foley catheter was placed using sterile technique.  He was then rolled prone on chest rolls with care taken to pad all pressure points.  SCD hose was placed.  His right nephrostomy site was prepped with Betadine solution and he was draped in the usual sterile fashion.  A guidewire was passed through the nephrostomy catheter to the bladder. The nephrostomy catheter was removed, and the incision was extended to 1 cm.   The dual-lumen catheter was passed over the wire into the distal ureter and a second working wire was placed.  The dual-lumen catheter was removed and the NephroMax nephrostomy balloon dilation system was passed.  The balloon was inflated to 20 atmospheres under fluoroscopic guidance, and once the dilation was complete, the access sheath was inserted over the balloon.  The balloon was deflated and removed and the rigid nephroscope was then passed.  Initial inspection revealed some clots, which were removed with grasping forceps.  I then identified the stones in the mid and lower pole.  The stones were engaged with the ultrasonic and pneumatic lithotrite and broken into manageable fragments which were then removed with a 3-prong grasper.  Smaller fragments were removed using ultrasonic aspiration.  Once all fragments were removed, they could be identified with the rigid nephroscope.  The flexible nephroscope was then passed using a pressure bag to aid irrigation.  Inspection of the upper pole demonstrated a single adherent stone on the calyceal papilla.  This was engaged with a NCompass basket and knocked loose, but then retrieved and removed.  Fluoroscopy revealed some residual fragments that appeared to be just above the tip of the sheath.  With further inspection, I was able to identify a pocket of small gravel in the midpole calyx.  These were retrieved using the NCompass basket.  Once all of these had been retrieved, a final inspection with the rigid scope was performed.  There were couple of other fragments adherent to some clot along the nephrostomy tract.  These were removed using the 3- prong graspers.  On final inspection, there was no obvious residual stone material and fluoroscopic imaging was clear.  The nephroscope was removed and a 24-French Rolanda Jay was placed over the working wire through the sheath and a 6-French angled tip nephrostomy catheter was placed to the  distal ureter over the safety wire.  The safety wire was removed.  The access sheath was backed out. A 2-0 silk suture was used to secure both the 6-French and 24-French catheters and tightened the nephrostomy tract.  An antegrade nephrostogram was performed with Omnipaque.  The nephrostogram revealed no extravasation and good positioning of the nephrostomy tube with good antegrade flow.  The wire was removed from the 24-French catheter and this sheath was cut away.  A 6-French catheter was capped.  The 24-French catheter was placed to straight drainage.  The drapes were removed.  The wound was cleansed, and a dressing was applied.  The patient was then turned supine on the recovery room stretcher.  His anesthetic was reversed, and he was moved to the recovery room in stable condition.  There was minimal apparent bleeding during the procedure and no complications.  The stone fragments were given to the patient's family to bring to our office.   He will have postop imaging and a second look if there are residual fragments.     Marshall Cork. Jeffie Pollock, M.D.   ______________________________ Marshall Cork. Jeffie Pollock, M.D.    JJW/MEDQ  D:  04/16/2016  T:  04/17/2016  Job:  [id number]

## 2016-04-17 NOTE — Progress Notes (Signed)
Went over discharge summary and medications with patient and family.  Education completed on nephrology tube which will be left in till Monday per MD.  Family preformed teachback.  All questions answered.  Vss.  Pt wheeled out by NT.

## 2016-04-20 ENCOUNTER — Ambulatory Visit (HOSPITAL_COMMUNITY): Payer: PPO | Admitting: Anesthesiology

## 2016-04-20 ENCOUNTER — Encounter (HOSPITAL_COMMUNITY)
Admission: RE | Disposition: A | Discharge status: Home / Self Care | Payer: Self-pay | Source: Ambulatory Visit | Attending: Urology

## 2016-04-20 ENCOUNTER — Encounter (HOSPITAL_COMMUNITY): Payer: Self-pay | Admitting: *Deleted

## 2016-04-20 ENCOUNTER — Ambulatory Visit (HOSPITAL_COMMUNITY)
Admission: RE | Admit: 2016-04-20 | Discharge: 2016-04-20 | Disposition: A | Discharge status: Home / Self Care | Payer: PPO | Source: Ambulatory Visit | Attending: Urology | Admitting: Urology

## 2016-04-20 ENCOUNTER — Other Ambulatory Visit: Payer: Self-pay | Admitting: Urology

## 2016-04-20 ENCOUNTER — Ambulatory Visit (HOSPITAL_COMMUNITY): Payer: PPO

## 2016-04-20 DIAGNOSIS — M170 Bilateral primary osteoarthritis of knee: Secondary | ICD-10-CM | POA: Diagnosis not present

## 2016-04-20 DIAGNOSIS — I10 Essential (primary) hypertension: Secondary | ICD-10-CM | POA: Diagnosis not present

## 2016-04-20 DIAGNOSIS — G4730 Sleep apnea, unspecified: Secondary | ICD-10-CM | POA: Insufficient documentation

## 2016-04-20 DIAGNOSIS — N200 Calculus of kidney: Secondary | ICD-10-CM | POA: Insufficient documentation

## 2016-04-20 DIAGNOSIS — Z9989 Dependence on other enabling machines and devices: Secondary | ICD-10-CM | POA: Insufficient documentation

## 2016-04-20 DIAGNOSIS — F418 Other specified anxiety disorders: Secondary | ICD-10-CM | POA: Diagnosis not present

## 2016-04-20 DIAGNOSIS — J309 Allergic rhinitis, unspecified: Secondary | ICD-10-CM | POA: Diagnosis not present

## 2016-04-20 DIAGNOSIS — Z7982 Long term (current) use of aspirin: Secondary | ICD-10-CM | POA: Insufficient documentation

## 2016-04-20 DIAGNOSIS — F419 Anxiety disorder, unspecified: Secondary | ICD-10-CM | POA: Insufficient documentation

## 2016-04-20 DIAGNOSIS — Z79899 Other long term (current) drug therapy: Secondary | ICD-10-CM | POA: Insufficient documentation

## 2016-04-20 DIAGNOSIS — K219 Gastro-esophageal reflux disease without esophagitis: Secondary | ICD-10-CM | POA: Diagnosis not present

## 2016-04-20 HISTORY — PX: NEPHROLITHOTOMY: SHX5134

## 2016-04-20 SURGERY — NEPHROLITHOTOMY PERCUTANEOUS SECOND LOOK
Anesthesia: General

## 2016-04-20 MED ORDER — PROMETHAZINE HCL 25 MG/ML IJ SOLN: 6.2500 mg | INTRAMUSCULAR | Status: DC | PRN

## 2016-04-20 MED ORDER — ONDANSETRON HCL 4 MG/2ML IJ SOLN
INTRAMUSCULAR | Status: DC | PRN
  Administered 2016-04-20: 4 mg via INTRAVENOUS

## 2016-04-20 MED ORDER — SCOPOLAMINE 1 MG/3DAYS TD PT72
MEDICATED_PATCH | TRANSDERMAL | Status: AC
  Filled 2016-04-20: qty 1

## 2016-04-20 MED ORDER — LACTATED RINGERS IV SOLN
INTRAVENOUS | Status: DC
  Administered 2016-04-20 (×3): via INTRAVENOUS

## 2016-04-20 MED ORDER — SUGAMMADEX SODIUM 200 MG/2ML IV SOLN
INTRAVENOUS | Status: DC | PRN
  Administered 2016-04-20: 200 mg via INTRAVENOUS

## 2016-04-20 MED ORDER — MIDAZOLAM HCL 5 MG/5ML IJ SOLN
INTRAMUSCULAR | Status: DC | PRN
  Administered 2016-04-20: 2 mg via INTRAVENOUS

## 2016-04-20 MED ORDER — ONDANSETRON HCL 4 MG/2ML IJ SOLN
INTRAMUSCULAR | Status: AC
  Filled 2016-04-20: qty 2

## 2016-04-20 MED ORDER — SCOPOLAMINE 1 MG/3DAYS TD PT72
MEDICATED_PATCH | TRANSDERMAL | Status: DC | PRN
  Administered 2016-04-20: 1 via TRANSDERMAL

## 2016-04-20 MED ORDER — ROCURONIUM BROMIDE 50 MG/5ML IV SOSY
PREFILLED_SYRINGE | INTRAVENOUS | Status: AC
  Filled 2016-04-20: qty 5

## 2016-04-20 MED ORDER — SODIUM CHLORIDE 0.9 % IR SOLN
Status: DC | PRN
  Administered 2016-04-20: 3000 mL

## 2016-04-20 MED ORDER — GENTAMICIN SULFATE 40 MG/ML IJ SOLN
5.0000 mg/kg | INTRAVENOUS | Status: AC
  Administered 2016-04-20: 450 mg via INTRAVENOUS
  Filled 2016-04-20: qty 11.25

## 2016-04-20 MED ORDER — SUFENTANIL CITRATE 50 MCG/ML IV SOLN
INTRAVENOUS | Status: DC | PRN
  Administered 2016-04-20 (×3): 5 ug via INTRAVENOUS

## 2016-04-20 MED ORDER — SUFENTANIL CITRATE 50 MCG/ML IV SOLN
INTRAVENOUS | Status: AC
  Filled 2016-04-20: qty 1

## 2016-04-20 MED ORDER — LIDOCAINE 2% (20 MG/ML) 5 ML SYRINGE
INTRAMUSCULAR | Status: AC
  Filled 2016-04-20: qty 5

## 2016-04-20 MED ORDER — IOHEXOL 300 MG/ML  SOLN
INTRAMUSCULAR | Status: DC | PRN
  Administered 2016-04-20: 10 mL

## 2016-04-20 MED ORDER — LIDOCAINE HCL (CARDIAC) 20 MG/ML IV SOLN
INTRAVENOUS | Status: DC | PRN
  Administered 2016-04-20: 25 mg via INTRATRACHEAL
  Administered 2016-04-20: 75 mg via INTRAVENOUS

## 2016-04-20 MED ORDER — DEXAMETHASONE SODIUM PHOSPHATE 10 MG/ML IJ SOLN
INTRAMUSCULAR | Status: AC
  Filled 2016-04-20: qty 1

## 2016-04-20 MED ORDER — FENTANYL CITRATE (PF) 100 MCG/2ML IJ SOLN
INTRAMUSCULAR | Status: DC | PRN
  Administered 2016-04-20: 50 ug via INTRAVENOUS

## 2016-04-20 MED ORDER — ROCURONIUM BROMIDE 100 MG/10ML IV SOLN
INTRAVENOUS | Status: DC | PRN
  Administered 2016-04-20: 5 mg via INTRAVENOUS
  Administered 2016-04-20: 45 mg via INTRAVENOUS

## 2016-04-20 MED ORDER — ARTIFICIAL TEARS OP OINT
TOPICAL_OINTMENT | OPHTHALMIC | Status: AC
  Filled 2016-04-20: qty 3.5

## 2016-04-20 MED ORDER — FENTANYL CITRATE (PF) 100 MCG/2ML IJ SOLN
INTRAMUSCULAR | Status: AC
  Filled 2016-04-20: qty 2

## 2016-04-20 MED ORDER — MIDAZOLAM HCL 2 MG/2ML IJ SOLN
INTRAMUSCULAR | Status: AC
  Filled 2016-04-20: qty 2

## 2016-04-20 MED ORDER — PROPOFOL 10 MG/ML IV BOLUS
INTRAVENOUS | Status: DC | PRN
  Administered 2016-04-20: 200 mg via INTRAVENOUS

## 2016-04-20 MED ORDER — DEXAMETHASONE SODIUM PHOSPHATE 10 MG/ML IJ SOLN
INTRAMUSCULAR | Status: DC | PRN
  Administered 2016-04-20: 10 mg via INTRAVENOUS

## 2016-04-20 MED ORDER — SUCCINYLCHOLINE CHLORIDE 20 MG/ML IJ SOLN
INTRAMUSCULAR | Status: DC | PRN
  Administered 2016-04-20: 120 mg via INTRAVENOUS

## 2016-04-20 MED ORDER — ACETAMINOPHEN 10 MG/ML IV SOLN
INTRAVENOUS | Status: AC
Start: 2016-04-20 — End: 2016-04-20
  Filled 2016-04-20: qty 100

## 2016-04-20 MED ORDER — HYDROMORPHONE HCL 1 MG/ML IJ SOLN: 0.2500 mg | INTRAMUSCULAR | Status: DC | PRN

## 2016-04-20 MED ORDER — ACETAMINOPHEN 10 MG/ML IV SOLN
INTRAVENOUS | Status: DC | PRN
  Administered 2016-04-20: 1000 mg via INTRAVENOUS

## 2016-04-20 MED ORDER — SUCCINYLCHOLINE CHLORIDE 200 MG/10ML IV SOSY
PREFILLED_SYRINGE | INTRAVENOUS | Status: AC
  Filled 2016-04-20: qty 10

## 2016-04-20 SURGICAL SUPPLY — 45 items
APL SKNCLS STERI-STRIP NONHPOA (GAUZE/BANDAGES/DRESSINGS) ×1
BAG URINE DRAINAGE (UROLOGICAL SUPPLIES) ×1 IMPLANT
BASKET ZERO TIP NITINOL 2.4FR (BASKET) IMPLANT
BENZOIN TINCTURE PRP APPL 2/3 (GAUZE/BANDAGES/DRESSINGS) ×6 IMPLANT
BLADE SURG 15 STRL LF DISP TIS (BLADE) ×1 IMPLANT
BLADE SURG 15 STRL SS (BLADE)
BSKT STON RTRVL ZERO TP 2.4FR (BASKET)
CATCHER STONE W/TUBE ADAPTER (UROLOGICAL SUPPLIES) IMPLANT
CATH FOLEY 2W COUNCIL 20FR 5CC (CATHETERS) IMPLANT
CATH ROBINSON RED A/P 20FR (CATHETERS) IMPLANT
CATH URET 5FR 28IN OPEN ENDED (CATHETERS) IMPLANT
CATH URET DUAL LUMEN 6-10FR 50 (CATHETERS) ×1 IMPLANT
CATH X-FORCE N30 NEPHROSTOMY (TUBING) IMPLANT
COVER SURGICAL LIGHT HANDLE (MISCELLANEOUS) ×1 IMPLANT
DRAPE C-ARM 42X120 X-RAY (DRAPES) ×3 IMPLANT
DRAPE LINGEMAN PERC (DRAPES) ×3 IMPLANT
DRAPE SURG IRRIG POUCH 19X23 (DRAPES) ×3 IMPLANT
DRSG PAD ABDOMINAL 8X10 ST (GAUZE/BANDAGES/DRESSINGS) ×6 IMPLANT
DRSG TEGADERM 8X12 (GAUZE/BANDAGES/DRESSINGS) ×6 IMPLANT
EXTRACTOR STONE NITINOL NGAGE (UROLOGICAL SUPPLIES) ×2 IMPLANT
FIBER LASER FLEXIVA 1000 (UROLOGICAL SUPPLIES) IMPLANT
FIBER LASER FLEXIVA 550 (UROLOGICAL SUPPLIES) IMPLANT
FIBER LASER TRAC TIP (UROLOGICAL SUPPLIES) IMPLANT
GAUZE SPONGE 4X4 12PLY STRL (GAUZE/BANDAGES/DRESSINGS) ×3 IMPLANT
GLOVE SURG SS PI 8.0 STRL IVOR (GLOVE) IMPLANT
GOWN STRL REUS W/TWL XL LVL3 (GOWN DISPOSABLE) ×3 IMPLANT
GUIDEWIRE AMPLAZ .035X145 (WIRE) ×2 IMPLANT
KIT BASIN OR (CUSTOM PROCEDURE TRAY) ×3 IMPLANT
MANIFOLD NEPTUNE II (INSTRUMENTS) ×3 IMPLANT
NS IRRIG 1000ML POUR BTL (IV SOLUTION) ×3 IMPLANT
PACK BASIC VI WITH GOWN DISP (CUSTOM PROCEDURE TRAY) ×1 IMPLANT
PACK CYSTO (CUSTOM PROCEDURE TRAY) ×3 IMPLANT
PROBE LITHOCLAST ULTRA 3.8X403 (UROLOGICAL SUPPLIES) IMPLANT
PROBE PNEUMATIC 1.0MMX570MM (UROLOGICAL SUPPLIES) IMPLANT
SET IRRIG Y TYPE TUR BLADDER L (SET/KITS/TRAYS/PACK) ×1 IMPLANT
SPONGE LAP 4X18 X RAY DECT (DISPOSABLE) ×3 IMPLANT
STONE CATCHER W/TUBE ADAPTER (UROLOGICAL SUPPLIES) IMPLANT
SUT SILK 2 0 30  PSL (SUTURE)
SUT SILK 2 0 30 PSL (SUTURE) ×1 IMPLANT
SYR 10ML LL (SYRINGE) ×3 IMPLANT
SYR 20CC LL (SYRINGE) ×6 IMPLANT
TOWEL OR NON WOVEN STRL DISP B (DISPOSABLE) ×3 IMPLANT
TRAY FOLEY W/METER SILVER 16FR (SET/KITS/TRAYS/PACK) ×3 IMPLANT
TUBING CONNECTING 10 (TUBING) ×4 IMPLANT
TUBING CONNECTING 10' (TUBING) ×1

## 2016-04-20 NOTE — H&P (View-Only) (Signed)
HPI: Brandon Schroeder is a 71 year-old male established patient who is here for renal calculi.  He is schedule for a right PCNL on Thursday of this week for a RLP staghorn calculus. Discussion of need for surgery held back in January. He returns today for preoperative history and physical. Denies exacerbation of any bothersome lower urinary tract symptoms. He has not had any gross hematuria. Denies any right-sided flank or back pain. No recent fevers or abx treatment.   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 He is scheduled to have that performed this week prior to his surgery.   The problem is on the right side. He is not currently having flank pain, back pain, groin pain, nausea, vomiting, fever or chills. He has not caught a stone in his urine strainer since his symptoms began.     ALLERGIES: Penicillins    MEDICATIONS: Align  Ambien 5 mg tablet Oral  Aspirin Ec 81 mg tablet, delayed release Oral  Calcium  Euflexxa 20 mg/2 ml (10 mg/ml) (mw 2.4-3.6 million daltons) syringe  Lotrel 10 mg-40 mg capsule Oral  Multivitamin  Zoloft 25 mg tablet Oral     GU PSH: Cystoscopy Insert Stent - 01/22/2014 Renal ESWL - 01/22/2014      PSH Notes: Lithotripsy, Cystoscopy With Insertion Of Ureteral Stent, Closed Treatment Of A Single Rib Fracture, Open Treatment Of Clavicular Fracture, Rhinologic Surgery, Cataract Surgery, Tonsillectomy, Nose Surgery   NON-GU PSH: Eye Surgery (Unspecified), Right, retinal detachment - 09/24/2014 Hernia Repair - 08/24/2014 Nose Surgery (Unspecified) - 01/22/2014 Remove Tonsils - 2008 Treat Clavicle Fracture - 01/22/2014    GU PMH: Elevated PSA (Worsening), His PSA is up to 6.03 but his exam remains benign and he had a negative biopsy about 2 years ago for a PSA of 5.03. I am going to repeat the PSA today and if it is still going up I will consider and prostate MRI and repeat biopsy. - 02/24/2016, Elevated prostate specific antigen (PSA), -  01/31/2014 Kidney Stone, Calculus of kidney - 07/23/2014, Nephrolithiasis, - 2014 Urinary Tract Inf, Unspec site, Urinary tract infection - 07/23/2014 BPH w/LUTS, Benign prostatic hyperplasia with urinary obstruction - 01/22/2014 Disorder Kidney/ureter, Unspec, Renal insufficiency - 01/22/2014 Other microscopic hematuria, Microscopic hematuria - 01/22/2014 Urinary Hesitancy, Hesitancy - 01/22/2014 Hematuria, Unspec, Hematuria - 2014 Obstructive and reflux uropathy, Unspec, Obstructive uropathy - 2014 Personal Hx urinary calculi, Nephrolithiasis - 2014    NON-GU PMH: Encounter for general adult medical examination without abnormal findings, Encounter for preventive health examination - 07/23/2014 Anxiety, Anxiety - 01/22/2014 Hereditary and idiopathic neuropathy, unspecified, Neuropathy, idiopathic - 01/22/2014 Personal history of diseases of the skin and subcutaneous tissue, History of psoriasis - 01/22/2014 Personal history of other diseases of the circulatory system, History of hypertension - 01/22/2014, History of hypertension, - 2014 Personal history of other diseases of the digestive system, History of esophageal reflux - 01/22/2014 Personal history of other diseases of the musculoskeletal system and connective tissue, History of arthritis - 01/22/2014 Personal history of other diseases of the nervous system and sense organs, History of sleep apnea - 01/22/2014    FAMILY HISTORY: Alzheimer's Disease - Runs In Family Death In The Family Father - Runs In Family Family Health Status Number - Runs In Family Hematuria - Runs In Family Kidney Stones - Runs In Family malignant neoplasm - Runs In Family nephrolithiasis - Runs In Family pulmonary fibrosis - Runs In Family   SOCIAL HISTORY: Marital Status: Married Current  Smoking Status: Patient has never smoked.   Tobacco Use Assessment Completed: Used Tobacco in last 30 days? Drinks 2 caffeinated drinks per day.     Notes: Retired, Number of  children, Never a smoker, Father deceased, Alcohol use, Occupation, Mother deceased, Caffeine use, Married, Alcohol Use, Occupation:, Marital History - Currently Married, Tobacco Use, Caffeine Use   REVIEW OF SYSTEMS:    GU Review Male:   Patient denies frequent urination, hard to postpone urination, burning/ pain with urination, get up at night to urinate, leakage of urine, stream starts and stops, trouble starting your stream, have to strain to urinate , erection problems, and penile pain.  Gastrointestinal (Upper):   Patient denies nausea, vomiting, and indigestion/ heartburn.  Gastrointestinal (Lower):   Patient denies diarrhea and constipation.  Constitutional:   Patient denies fever, night sweats, weight loss, and fatigue.  Skin:   Patient reports skin rash/ lesion. Patient denies itching.  Eyes:   Patient denies blurred vision and double vision.  Ears/ Nose/ Throat:   Patient denies sore throat and sinus problems.  Hematologic/Lymphatic:   Patient denies swollen glands and easy bruising.  Cardiovascular:   Patient denies leg swelling and chest pains.  Respiratory:   Patient denies cough and shortness of breath.  Endocrine:   Patient denies excessive thirst.  Musculoskeletal:   Patient reports joint pain. Patient denies back pain.  Neurological:   Patient denies headaches and dizziness.  Psychologic:   Patient denies depression and anxiety.   VITAL SIGNS:      04/13/2016 01:39 PM  Weight 216 lb / 97.98 kg  Height 75 in / 190.5 cm  BP 144/90 mmHg  Heart Rate 69 /min  BMI 27.0 kg/m   MULTI-SYSTEM PHYSICAL EXAMINATION:    Constitutional: Well-nourished. No physical deformities. Normally developed. Good grooming.  Neck: Neck symmetrical, not swollen. Normal tracheal position.  Respiratory: No labored breathing, no use of accessory muscles. CTA  Cardiovascular: Normal temperature, RRR without murmur.  Skin: No paleness, no jaundice, no cyanosis. No lesion, no ulcer, no rash.   Neurologic / Psychiatric: Oriented to time, oriented to place, oriented to person. No depression, no anxiety, no agitation.  Gastrointestinal: No mass, no tenderness, no rigidity, non obese abdomen. No palpable flank or CVA tenderness.  Musculoskeletal: Spine, ribs, pelvis no bilateral tenderness. Normal gait and station of head and neck.     PAST DATA REVIEWED:  Source Of History:  Patient  Records Review:   Previous Patient Records  Urine Test Review:   Urinalysis  X-Ray Review: KUB: Reviewed Films.     02/24/16 02/19/16 12/12/13 08/31/13 04/28/12 03/18/11 09/10/06  PSA  Total PSA 4.58 ng/dl 6.03 ng/dl 5.04 ng/dl 4.25 ng/dl 3.53 ng/dl 1.84 ng/dl 2.40   Free PSA 1.26 ng/dl        % Free PSA 28 %          04/13/16  Urinalysis  Urine Appearance Clear   Urine Color Yellow   Urine Glucose Neg   Urine Bilirubin Neg   Urine Ketones Neg   Urine Specific Gravity 1.015   Urine Blood Neg   Urine pH 6.5   Urine Protein Neg   Urine Urobilinogen 0.2   Urine Nitrites Neg   Urine Leukocyte Esterase Neg    PROCEDURES: None   ASSESSMENT:      ICD-10 Details  1 GU:   Kidney Stone - N20.0    PLAN:            Medications Stop Meds:  Doxycycline Hyclate 100 mg tablet 1 tablet PO Daily  Discontinue: 04/13/2016  - Reason: The medication cycle was completed.            Orders Labs Urine Culture and Sensitivity          Schedule Return Visit/Planned Activity: Keep Scheduled Appointment - Schedule Surgery          Document Letter(s):  Created for Patient: Clinical Summary         Notes:   I see noComplications that wound necessitate not proceeding with PCNL on Thursday of this week. Physical exam within normal limits. I asked him to let the anesthesiologist and Dr. Jeffie Pollock know about the euflexxa injections he has been receiving in his knee but I don't feel that that would be a reason to delay surgery. Urinalysis sent for culture. All questions answered to the best of my ability.

## 2016-04-20 NOTE — Anesthesia Preprocedure Evaluation (Addendum)
Anesthesia Evaluation  Patient identified by MRN, date of birth, ID band Patient awake    Reviewed: Allergy & Precautions, NPO status , Patient's Chart, lab work & pertinent test results  Airway Mallampati: II  TM Distance: >3 FB Neck ROM: Full    Dental no notable dental hx.    Pulmonary sleep apnea and Continuous Positive Airway Pressure Ventilation ,    Pulmonary exam normal breath sounds clear to auscultation       Cardiovascular hypertension, Normal cardiovascular exam Rhythm:Regular Rate:Normal     Neuro/Psych negative neurological ROS  negative psych ROS   GI/Hepatic negative GI ROS, Neg liver ROS,   Endo/Other  negative endocrine ROS  Renal/GU negative Renal ROS  negative genitourinary   Musculoskeletal negative musculoskeletal ROS (+)   Abdominal   Peds negative pediatric ROS (+)  Hematology negative hematology ROS (+)   Anesthesia Other Findings   Reproductive/Obstetrics negative OB ROS                             Anesthesia Physical Anesthesia Plan  ASA: II  Anesthesia Plan: General   Post-op Pain Management:    Induction: Intravenous  Airway Management Planned: Oral ETT and Video Laryngoscope Planned  Additional Equipment:   Intra-op Plan:   Post-operative Plan: Extubation in OR  Informed Consent: I have reviewed the patients History and Physical, chart, labs and discussed the procedure including the risks, benefits and alternatives for the proposed anesthesia with the patient or authorized representative who has indicated his/her understanding and acceptance.   Dental advisory given  Plan Discussed with: CRNA and Surgeon  Anesthesia Plan Comments: (Grade 2 and 3 views with miller 3, and mac 4 respectively.)      Anesthesia Quick Evaluation

## 2016-04-20 NOTE — Progress Notes (Signed)
PACU NURSING NOTE:  Pt able to ambulate to restroom without any difficulty, gait very steady, able to void, tolerates PO fluids well, cont to deny any pain or discomfort from surgical site at Rt flank.  DC instructions reviewed with wife and patient. Pt teaching done re: post op diet, proper handwashing when changing dsg as instructed, dsg change reviewed with wife as well. Discussed safety while taking PO pain meds. Also discussed signs and symptoms of infection. Times to call MD. Opportunity for questions provided. Pt escorted to exit via wheelchair, assisted into POV.  Pt to home with wife

## 2016-04-20 NOTE — Interval H&P Note (Signed)
History and Physical Interval Note:He has a 27mm residual stone in the midpole and needs a second look. 04/20/2016 4:08 PM  Brandon Schroeder.  has presented today for surgery, with the diagnosis of residual stone right kidney  The various methods of treatment have been discussed with the patient and family. After consideration of risks, benefits and other options for treatment, the patient has consented to  Procedure(s): NEPHROLITHOTOMY PERCUTANEOUS SECOND LOOK  WITH DIGITAL URETEROSCOPE AND POSSIBLE HOLMIUM LASER (N/A) as a surgical intervention .  The patient's history has been reviewed, patient examined, no change in status, stable for surgery.  I have reviewed the patient's chart and labs.  Questions were answered to the patient's satisfaction.     [name],[name]

## 2016-04-20 NOTE — Discharge Instructions (Signed)
Percutaneous Nephrolithotomy, Care After °Refer to this sheet in the next few weeks. These instructions provide you with information about caring for yourself after your procedure. Your health care provider may also give you more specific instructions. Your treatment has been planned according to current medical practices, but problems sometimes occur. Call your health care provider if you have any problems or questions after your procedure. °What can I expect after the procedure? °After the procedure, it is common to have: °· Soreness or pain. °· Fatigue. °· Some blood in your urine for a few days. °Follow these instructions at home: °Incision care  ° °· Follow instructions from your health care provider about how to take care of your cut from surgery (incision). Make sure you: °¨ Wash your hands with soap and water before you change your bandage (dressing). If soap and water are not available, use hand sanitizer. °¨ Change your dressing as told by your health care provider. °¨ Leave stitches (sutures), skin glue, or adhesive strips in place. These skin closures may need to stay in place for 2 weeks or longer. If adhesive strip edges start to loosen and curl up, you may trim the loose edges. Do not remove adhesive strips completely unless your health care provider tells you to do that. °· Check your incision area every day for signs of infection. Check for: °¨ More redness, swelling, or pain. °¨ More fluid or blood. °¨ Warmth. °¨ Pus or a bad smell. °Activity  °· Avoid strenuous activities for as long as told by your health care provider. °· Return to your normal activities as told by your health care provider. Ask your health care provider what activities are safe for you. °General instructions  °· Take over-the-counter and prescription medicines only as told by your health care provider. °· Do not drive or operate heavy machinery while taking prescription pain medicine. °· Keep all follow-up visits as told by  your health care provider. This is important. °· You may have been sent home with a catheter or kidney drain tube. If so, carefully follow your health care provider’s instructions on how to take care of your catheter or kidney drain tube. °Contact a health care provider if: °· You have a fever. °· You have more redness, swelling, or pain around your incision. °· You have more fluid or blood coming from your incision. °· Your incision feels warm to the touch. °· You have pus or a bad smell coming from your incision. °· You lose your appetite. °· You feel nauseous or you vomit. °Get help right away if: °· You have blood clots in your urine. °· You cannot urinate. °· You have chest pain or trouble breathing. °This information is not intended to replace advice given to you by your health care provider. Make sure you discuss any questions you have with your health care provider. °Document Released: 03/01/2015 Document Revised: 07/11/2015 Document Reviewed: 07/23/2014 °Elsevier Interactive Patient Education © 2017 [employer] ° °

## 2016-04-20 NOTE — Transfer of Care (Signed)
Immediate Anesthesia Transfer of Care Note  [name].  Procedure(s) Performed: Procedure(s): NEPHROLITHOTOMY PERCUTANEOUS SECOND LOOK (N/A)  Patient Location: PACU  Anesthesia Type:General  Level of Consciousness: awake, alert , oriented and patient cooperative  Airway & Oxygen Therapy: Patient Spontanous Breathing and Patient connected to face mask oxygen  Post-op Assessment: Report given to RN, Post -op Vital signs reviewed and stable and Patient moving all extremities X 4  Post vital signs: stable  Last Vitals:  Vitals:   04/20/16 1400 04/20/16 1853  BP: (!) 138/97 (!) 153/106  Pulse:  72  Resp:  16  Temp:  36.7 C    Last Pain:  Vitals:   04/20/16 1425  TempSrc:   PainSc: 3       Patients Stated Pain Goal: 2 ([id number])  Complications: No apparent anesthesia complications

## 2016-04-20 NOTE — Anesthesia Procedure Notes (Signed)
Procedure Name: Intubation Date/Time: 04/20/2016 5:53 PM Performed by: Lissa Morales Pre-anesthesia Checklist: Patient identified, Emergency Drugs available, Suction available and Patient being monitored Patient Re-evaluated:Patient Re-evaluated prior to inductionOxygen Delivery Method: Circle system utilized Preoxygenation: Pre-oxygenation with 100% oxygen Intubation Type: IV induction Ventilation: Mask ventilation without difficulty Grade View: Grade II Tube type: Oral Tube size: 8.0 mm Number of attempts: 1 Airway Equipment and Method: Stylet,  Oral airway and Video-laryngoscopy Placement Confirmation: ETT inserted through vocal cords under direct vision,  positive ETCO2 and breath sounds checked- equal and bilateral Secured at: 22 cm Tube secured with: Tape Dental Injury: Teeth and Oropharynx as per pre-operative assessment  Difficulty Due To: Difficulty was anticipated Future Recommendations: Recommend- induction with short-acting agent, and alternative techniques readily available Comments: Elective glidescope  2ndary to  "very sore throat in past. MAP 2-3. Easy  Visualization with #4 blade on glidescope. BBS= +ETCO2

## 2016-04-20 NOTE — Brief Op Note (Signed)
04/20/2016  6:29 PM  PATIENT:  Brandon Schroeder.  71 y.o. male  PRE-OPERATIVE DIAGNOSIS:  residual stone right kidney <2cm  POST-OPERATIVE DIAGNOSIS:  same  PROCEDURE:  Procedure(s): NEPHROLITHOTOMY PERCUTANEOUS SECOND LOOK  <2cm ANTEGRADE NEPHROSTOGRAM EXISTING TRACT.   SURGEON:  Surgeon(s) and Role:    * Irine Seal, MD - Primary  PHYSICIAN ASSISTANT:   ASSISTANTS: none   ANESTHESIA:   general  EBL:  Total I/O In: 1000 [I.V.:1000] Out: 150 [Urine:150]  BLOOD ADMINISTERED:none  DRAINS: Urinary Catheter (Foley)   LOCAL MEDICATIONS USED:  NONE  SPECIMEN:  Source of Specimen:  stone  DISPOSITION OF SPECIMEN:  to patient  COUNTS:  YES  TOURNIQUET:  * No tourniquets in log *  DICTATION: .Other Dictation: Dictation Number [id number]  PLAN OF CARE: Discharge to home after PACU  PATIENT DISPOSITION:  PACU - hemodynamically stable.   Delay start of Pharmacological VTE agent (>24hrs) due to surgical blood loss or risk of bleeding: yes

## 2016-04-20 NOTE — Anesthesia Postprocedure Evaluation (Addendum)
Anesthesia Post Note  [name].  Procedure(s) Performed: Procedure(s) (LRB): NEPHROLITHOTOMY PERCUTANEOUS SECOND LOOK (N/A)  Patient location during evaluation: PACU Anesthesia Type: General Level of consciousness: awake and alert Pain management: pain level controlled Vital Signs Assessment: post-procedure vital signs reviewed and stable Respiratory status: spontaneous breathing, nonlabored ventilation, respiratory function stable and patient connected to nasal cannula oxygen Cardiovascular status: blood pressure returned to baseline and stable Postop Assessment: no signs of nausea or vomiting Anesthetic complications: no       Last Vitals:  Vitals:   04/20/16 1853 04/20/16 1915  BP: (!) 153/106   Pulse: 72   Resp: 16   Temp: 36.7 C 36.8 C    Last Pain:  Vitals:   04/20/16 1425  TempSrc:   PainSc: 3                  Rillie Riffel S

## 2016-04-21 ENCOUNTER — Encounter (HOSPITAL_COMMUNITY): Payer: Self-pay | Admitting: Urology

## 2016-04-21 NOTE — Op Note (Signed)
NAMEMarland Kitchen  Brandon Schroeder, Brandon Schroeder NO.:  [id number]  MEDICAL RECORD NO.:  [id number]  LOCATION:                                 FACILITY:  PHYSICIAN:  Camari Travers. Jeffie Pollock, M.D.         DATE OF BIRTH:  DATE OF PROCEDURE:  04/20/2016 DATE OF DISCHARGE:                              OPERATIVE REPORT   PREOPERATIVE DIAGNOSIS:  Residual right stone fragment.  POSTOPERATIVE DIAGNOSIS:  Residual right stone fragment.  PROCEDURE: 1. Second look right percutaneous nephrolithotomy for less than 2 cm     stone. 2. Antegrade nephrostogram through existing tract.  SURGEON:  Durrell Bonfante. Jeffie Pollock, M.D.  ANESTHESIA:  General.  SPECIMEN:  Stone.  BLOOD LOSS:  None.  DRAINS:  Foley catheter.  COMPLICATIONS:  None.  INDICATIONS:  [name] is a 71 year old white male who underwent a percutaneous nephrolithotomy for a 3.4-cm staghorn stone last week. Followup imaging revealed a residual 5-mm calculus in the mid pole of the kidney that on CT was in a parallel calyx to the tract.  It was felt that second look was indicated.  FINDINGS AND PROCEDURES:  He was taken to the operating room where general anesthetic was induced on the holding room stretcher.  A Foley catheter was inserted using sterile technique, and he was fitted with PAS hose.  He was given gentamicin per pharmacy dosing.  He was rolled supine on chest rolls with care taken to pad all pressure points.  His nephrostomy and safety catheters were removed.  His nephrostomy site was prepped with Betadine solution.  He was draped in usual sterile fashion.  A flexible nephroscope was passed through the tract and the stone was identified and the calyx in the mid pole as noted on CT scan.  It was engaged with an NGage basket and removed intact.  At this point, a small clot was removed from the UPJ.  I then injected contrast through the scope to perform a nephrostogram. This demonstrated good antegrade flow to the bladder and  no extravasation.  In light of the nephrostogram findings, it was felt that I did not need to replace the tube.  The drapes were removed and a dressing was applied.  The patient was then rolled back supine on the holding room stretcher and his anesthetic was reversed.  He was moved to recovery room in stable condition.  There were no complications.     Marshall Cork. Jeffie Pollock, M.D.   ______________________________ Marshall Cork. Jeffie Pollock, M.D.    JJW/MEDQ  D:  04/20/2016  T:  04/20/2016  Job:  [id number]

## 2016-04-30 DIAGNOSIS — N200 Calculus of kidney: Secondary | ICD-10-CM | POA: Diagnosis not present

## 2016-05-11 DIAGNOSIS — L400 Psoriasis vulgaris: Secondary | ICD-10-CM | POA: Diagnosis not present

## 2016-05-11 DIAGNOSIS — M2550 Pain in unspecified joint: Secondary | ICD-10-CM | POA: Diagnosis not present

## 2016-05-11 DIAGNOSIS — E663 Overweight: Secondary | ICD-10-CM | POA: Diagnosis not present

## 2016-05-11 DIAGNOSIS — Z6829 Body mass index (BMI) 29.0-29.9, adult: Secondary | ICD-10-CM | POA: Diagnosis not present

## 2016-06-05 DIAGNOSIS — Z961 Presence of intraocular lens: Secondary | ICD-10-CM | POA: Diagnosis not present

## 2016-06-05 DIAGNOSIS — D3131 Benign neoplasm of right choroid: Secondary | ICD-10-CM | POA: Diagnosis not present

## 2016-06-05 DIAGNOSIS — D3140 Benign neoplasm of unspecified ciliary body: Secondary | ICD-10-CM | POA: Diagnosis not present

## 2016-06-05 DIAGNOSIS — H524 Presbyopia: Secondary | ICD-10-CM | POA: Diagnosis not present

## 2016-06-10 ENCOUNTER — Other Ambulatory Visit: Payer: Self-pay | Admitting: Family Medicine

## 2016-06-10 NOTE — Telephone Encounter (Signed)
We gave him a 6 month supply on 03-06-16

## 2016-06-11 ENCOUNTER — Ambulatory Visit (INDEPENDENT_AMBULATORY_CARE_PROVIDER_SITE_OTHER): Payer: PPO | Admitting: Orthopaedic Surgery

## 2016-06-11 ENCOUNTER — Encounter (INDEPENDENT_AMBULATORY_CARE_PROVIDER_SITE_OTHER): Payer: Self-pay | Admitting: Orthopaedic Surgery

## 2016-06-11 DIAGNOSIS — M25561 Pain in right knee: Secondary | ICD-10-CM

## 2016-06-11 DIAGNOSIS — G8929 Other chronic pain: Secondary | ICD-10-CM | POA: Diagnosis not present

## 2016-06-11 MED ORDER — BUPIVACAINE HCL 0.5 % IJ SOLN
3.0000 mL | INTRAMUSCULAR | Status: AC | PRN
  Administered 2016-06-11: 3 mL via INTRA_ARTICULAR

## 2016-06-11 MED ORDER — METHYLPREDNISOLONE ACETATE 40 MG/ML IJ SUSP
80.0000 mg | INTRAMUSCULAR | Status: AC | PRN
  Administered 2016-06-11: 80 mg

## 2016-06-11 MED ORDER — LIDOCAINE HCL 1 % IJ SOLN
5.0000 mL | INTRAMUSCULAR | Status: AC | PRN
  Administered 2016-06-11: 5 mL
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
## 2016-06-12 LAB — SYNOVIAL CELL COUNT + DIFF, W/ CRYSTALS
Basophils, %: 0 %
EOSINOPHILS-SYNOVIAL: 0 % (ref 0–2)
LYMPHOCYTES-SYNOVIAL FLD: 9 % (ref 0–74)
MONOCYTE/MACROPHAGE: 6 % (ref 0–69)
NEUTROPHIL, SYNOVIAL: 85 % — AB (ref 0–24)
Synoviocytes, %: 0 % (ref 0–15)
WBC, Synovial: 1324 cells/uL — ABNORMAL HIGH (ref <150)

## 2016-07-06 ENCOUNTER — Encounter (INDEPENDENT_AMBULATORY_CARE_PROVIDER_SITE_OTHER): Payer: Self-pay | Admitting: Orthopaedic Surgery

## 2016-07-06 ENCOUNTER — Ambulatory Visit (INDEPENDENT_AMBULATORY_CARE_PROVIDER_SITE_OTHER): Payer: PPO | Admitting: Orthopaedic Surgery

## 2016-07-06 VITALS — BP 115/74 | HR 72 | Resp 14 | Ht 74.0 in | Wt 233.0 lb

## 2016-07-06 DIAGNOSIS — G8929 Other chronic pain: Secondary | ICD-10-CM

## 2016-07-06 DIAGNOSIS — M25561 Pain in right knee: Secondary | ICD-10-CM

## 2016-07-06 MED ORDER — METHYLPREDNISOLONE ACETATE 40 MG/ML IJ SUSP
80.0000 mg | INTRAMUSCULAR | Status: AC | PRN
  Administered 2016-07-06: 80 mg

## 2016-07-06 MED ORDER — LIDOCAINE HCL 1 % IJ SOLN
5.0000 mL | INTRAMUSCULAR | Status: AC | PRN
  Administered 2016-07-06: 5 mL

## 2016-07-06 MED ORDER — BUPIVACAINE HCL 0.5 % IJ SOLN
3.0000 mL | INTRAMUSCULAR | Status: AC | PRN
  Administered 2016-07-06: 3 mL via INTRA_ARTICULAR
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
## 2016-07-07 DIAGNOSIS — M1120 Other chondrocalcinosis, unspecified site: Secondary | ICD-10-CM | POA: Diagnosis not present

## 2016-07-07 DIAGNOSIS — M2550 Pain in unspecified joint: Secondary | ICD-10-CM | POA: Diagnosis not present

## 2016-07-07 DIAGNOSIS — L400 Psoriasis vulgaris: Secondary | ICD-10-CM | POA: Diagnosis not present

## 2016-07-07 DIAGNOSIS — E663 Overweight: Secondary | ICD-10-CM | POA: Diagnosis not present

## 2016-07-07 DIAGNOSIS — Z6829 Body mass index (BMI) 29.0-29.9, adult: Secondary | ICD-10-CM | POA: Diagnosis not present

## 2016-07-10 ENCOUNTER — Encounter: Payer: Self-pay | Admitting: Family Medicine

## 2016-07-20 NOTE — Addendum Note (Signed)
Addendum  created 07/20/16 1158 by Myrtie Soman, MD   Sign clinical note

## 2016-08-16 ENCOUNTER — Encounter (HOSPITAL_COMMUNITY): Payer: Self-pay

## 2016-08-16 ENCOUNTER — Ambulatory Visit: Payer: Self-pay | Admitting: Surgery

## 2016-08-16 ENCOUNTER — Emergency Department (HOSPITAL_COMMUNITY): Payer: PPO

## 2016-08-16 ENCOUNTER — Observation Stay (HOSPITAL_COMMUNITY)
Admission: EM | Admit: 2016-08-16 | Discharge: 2016-08-18 | Disposition: A | Discharge status: Home / Self Care | Payer: PPO | Attending: Internal Medicine | Admitting: Internal Medicine

## 2016-08-16 DIAGNOSIS — G4733 Obstructive sleep apnea (adult) (pediatric): Secondary | ICD-10-CM | POA: Insufficient documentation

## 2016-08-16 DIAGNOSIS — E876 Hypokalemia: Secondary | ICD-10-CM

## 2016-08-16 DIAGNOSIS — K8000 Calculus of gallbladder with acute cholecystitis without obstruction: Principal | ICD-10-CM | POA: Insufficient documentation

## 2016-08-16 DIAGNOSIS — K810 Acute cholecystitis: Secondary | ICD-10-CM | POA: Diagnosis present

## 2016-08-16 DIAGNOSIS — Z79899 Other long term (current) drug therapy: Secondary | ICD-10-CM | POA: Insufficient documentation

## 2016-08-16 DIAGNOSIS — N400 Enlarged prostate without lower urinary tract symptoms: Secondary | ICD-10-CM | POA: Insufficient documentation

## 2016-08-16 DIAGNOSIS — L409 Psoriasis, unspecified: Secondary | ICD-10-CM | POA: Diagnosis not present

## 2016-08-16 DIAGNOSIS — K219 Gastro-esophageal reflux disease without esophagitis: Secondary | ICD-10-CM | POA: Diagnosis not present

## 2016-08-16 DIAGNOSIS — K819 Cholecystitis, unspecified: Secondary | ICD-10-CM

## 2016-08-16 DIAGNOSIS — I700 Atherosclerosis of aorta: Secondary | ICD-10-CM | POA: Insufficient documentation

## 2016-08-16 DIAGNOSIS — Z419 Encounter for procedure for purposes other than remedying health state, unspecified: Secondary | ICD-10-CM

## 2016-08-16 DIAGNOSIS — I10 Essential (primary) hypertension: Secondary | ICD-10-CM | POA: Insufficient documentation

## 2016-08-16 DIAGNOSIS — R1111 Vomiting without nausea: Secondary | ICD-10-CM | POA: Diagnosis not present

## 2016-08-16 DIAGNOSIS — R739 Hyperglycemia, unspecified: Secondary | ICD-10-CM

## 2016-08-16 DIAGNOSIS — R1011 Right upper quadrant pain: Secondary | ICD-10-CM | POA: Diagnosis not present

## 2016-08-16 LAB — URINALYSIS, ROUTINE W REFLEX MICROSCOPIC
BACTERIA UA: NONE SEEN
BILIRUBIN URINE: NEGATIVE
Glucose, UA: >=500 mg/dL — AB
Ketones, ur: 20 mg/dL — AB
LEUKOCYTES UA: NEGATIVE
NITRITE: NEGATIVE
Protein, ur: 30 mg/dL — AB
SPECIFIC GRAVITY, URINE: 1.016 (ref 1.005–1.030)
Squamous Epithelial / LPF: NONE SEEN
pH: 6 (ref 5.0–8.0)

## 2016-08-16 LAB — GLUCOSE, CAPILLARY: GLUCOSE-CAPILLARY: 153 mg/dL — AB (ref 65–99)

## 2016-08-16 LAB — COMPREHENSIVE METABOLIC PANEL
ALT: 20 U/L (ref 17–63)
ANION GAP: 13 (ref 5–15)
AST: 28 U/L (ref 15–41)
Albumin: 4.4 g/dL (ref 3.5–5.0)
Alkaline Phosphatase: 77 U/L (ref 38–126)
BUN: 21 mg/dL — ABNORMAL HIGH (ref 6–20)
CHLORIDE: 94 mmol/L — AB (ref 101–111)
CO2: 29 mmol/L (ref 22–32)
CREATININE: 1.2 mg/dL (ref 0.61–1.24)
Calcium: 9.3 mg/dL (ref 8.9–10.3)
GFR calc non Af Amer: 60 mL/min — ABNORMAL LOW (ref >60)
Glucose, Bld: 218 mg/dL — ABNORMAL HIGH (ref 65–99)
POTASSIUM: 2.7 mmol/L — AB (ref 3.5–5.1)
SODIUM: 136 mmol/L (ref 135–145)
Total Bilirubin: 1.9 mg/dL — ABNORMAL HIGH (ref 0.3–1.2)
Total Protein: 7.7 g/dL (ref 6.5–8.1)

## 2016-08-16 LAB — LIPASE, BLOOD: Lipase: 15 U/L (ref 11–51)

## 2016-08-16 LAB — CBC
HEMATOCRIT: 43.4 % (ref 39.0–52.0)
HEMOGLOBIN: 16 g/dL (ref 13.0–17.0)
MCH: 34.4 pg — ABNORMAL HIGH (ref 26.0–34.0)
MCHC: 36.9 g/dL — ABNORMAL HIGH (ref 30.0–36.0)
MCV: 93.3 fL (ref 78.0–100.0)
Platelets: 124 10*3/uL — ABNORMAL LOW (ref 150–400)
RBC: 4.65 MIL/uL (ref 4.22–5.81)
RDW: 13.6 % (ref 11.5–15.5)
WBC: 11.1 10*3/uL — AB (ref 4.0–10.5)

## 2016-08-16 LAB — TSH: TSH: 0.667 u[IU]/mL (ref 0.350–4.500)

## 2016-08-16 LAB — BASIC METABOLIC PANEL
Anion gap: 13 (ref 5–15)
BUN: 19 mg/dL (ref 6–20)
CALCIUM: 8.7 mg/dL — AB (ref 8.9–10.3)
CO2: 29 mmol/L (ref 22–32)
Chloride: 96 mmol/L — ABNORMAL LOW (ref 101–111)
Creatinine, Ser: 1.18 mg/dL (ref 0.61–1.24)
GFR calc Af Amer: >60 mL/min (ref >60)
Glucose, Bld: 167 mg/dL — ABNORMAL HIGH (ref 65–99)
POTASSIUM: 2.7 mmol/L — AB (ref 3.5–5.1)
SODIUM: 138 mmol/L (ref 135–145)

## 2016-08-16 LAB — PROTIME-INR
INR: 1.18
Prothrombin Time: 15 seconds (ref 11.4–15.2)

## 2016-08-16 LAB — MAGNESIUM
MAGNESIUM: 1.4 mg/dL — AB (ref 1.7–2.4)
MAGNESIUM: 1.5 mg/dL — AB (ref 1.7–2.4)

## 2016-08-16 LAB — APTT: aPTT: 33 seconds (ref 24–36)

## 2016-08-16 LAB — PHOSPHORUS: PHOSPHORUS: 3.6 mg/dL (ref 2.5–4.6)

## 2016-08-16 MED ORDER — SODIUM CHLORIDE 0.9% FLUSH: 3.0000 mL | INTRAVENOUS | Status: DC | PRN

## 2016-08-16 MED ORDER — ADULT MULTIVITAMIN W/MINERALS CH
1.0000 | ORAL_TABLET | Freq: Every day | ORAL | Status: DC
  Administered 2016-08-18: 1 via ORAL
  Filled 2016-08-16 (×2): qty 1

## 2016-08-16 MED ORDER — MAGNESIUM SULFATE 2 GM/50ML IV SOLN
2.0000 g | Freq: Once | INTRAVENOUS | Status: AC
  Administered 2016-08-16: 2 g via INTRAVENOUS
  Filled 2016-08-16: qty 50

## 2016-08-16 MED ORDER — ONDANSETRON 4 MG PO TBDP: 4.0000 mg | ORAL_TABLET | Freq: Once | ORAL | Status: DC | PRN

## 2016-08-16 MED ORDER — FLUTICASONE PROPIONATE 50 MCG/ACT NA SUSP
1.0000 | Freq: Every day | NASAL | Status: DC
  Administered 2016-08-16 – 2016-08-18 (×3): 1 via NASAL
  Filled 2016-08-16: qty 16

## 2016-08-16 MED ORDER — CEFTRIAXONE SODIUM 2 G IJ SOLR
2.0000 g | INTRAMUSCULAR | Status: DC
  Administered 2016-08-17: 2 g via INTRAVENOUS
  Filled 2016-08-16 (×2): qty 2

## 2016-08-16 MED ORDER — OXYCODONE HCL 5 MG PO TABS
5.0000 mg | ORAL_TABLET | Freq: Four times a day (QID) | ORAL | Status: DC | PRN
Start: 2016-08-16 — End: 2016-08-18
  Administered 2016-08-17 – 2016-08-18 (×2): 5 mg via ORAL
  Filled 2016-08-16 (×2): qty 1

## 2016-08-16 MED ORDER — GABAPENTIN 100 MG PO CAPS
100.0000 mg | ORAL_CAPSULE | Freq: Four times a day (QID) | ORAL | Status: DC
  Administered 2016-08-16 – 2016-08-18 (×3): 100 mg via ORAL
  Filled 2016-08-16 (×3): qty 1

## 2016-08-16 MED ORDER — KETOROLAC TROMETHAMINE 30 MG/ML IJ SOLN
30.0000 mg | Freq: Four times a day (QID) | INTRAMUSCULAR | Status: DC | PRN
  Administered 2016-08-16 – 2016-08-17 (×3): 30 mg via INTRAVENOUS
  Filled 2016-08-16 (×3): qty 1

## 2016-08-16 MED ORDER — SERTRALINE HCL 100 MG PO TABS
100.0000 mg | ORAL_TABLET | Freq: Two times a day (BID) | ORAL | Status: DC
  Administered 2016-08-16 – 2016-08-18 (×3): 100 mg via ORAL
  Filled 2016-08-16 (×3): qty 1

## 2016-08-16 MED ORDER — POTASSIUM CHLORIDE 10 MEQ/100ML IV SOLN
10.0000 meq | INTRAVENOUS | Status: AC
  Administered 2016-08-16 – 2016-08-17 (×4): 10 meq via INTRAVENOUS
  Filled 2016-08-16 (×4): qty 100

## 2016-08-16 MED ORDER — DEXTROSE 5 % IV SOLN
2.0000 g | Freq: Once | INTRAVENOUS | Status: AC
  Administered 2016-08-16: 2 g via INTRAVENOUS
  Filled 2016-08-16: qty 2

## 2016-08-16 MED ORDER — POTASSIUM CHLORIDE 10 MEQ/100ML IV SOLN
10.0000 meq | Freq: Once | INTRAVENOUS | Status: AC
  Administered 2016-08-16: 10 meq via INTRAVENOUS
  Filled 2016-08-16: qty 100

## 2016-08-16 MED ORDER — HEPARIN SODIUM (PORCINE) 5000 UNIT/ML IJ SOLN: 5000.0000 [IU] | Freq: Once | INTRAMUSCULAR | Status: DC

## 2016-08-16 MED ORDER — HYDROMORPHONE HCL 1 MG/ML IJ SOLN
0.5000 mg | INTRAMUSCULAR | Status: AC | PRN
  Administered 2016-08-16 (×3): 0.5 mg via INTRAVENOUS
  Filled 2016-08-16 (×4): qty 0.5

## 2016-08-16 MED ORDER — AMLODIPINE BESYLATE 10 MG PO TABS
10.0000 mg | ORAL_TABLET | Freq: Every day | ORAL | Status: DC
  Administered 2016-08-16 – 2016-08-18 (×2): 10 mg via ORAL
  Filled 2016-08-16 (×2): qty 1

## 2016-08-16 MED ORDER — CHLORHEXIDINE GLUCONATE CLOTH 2 % EX PADS
6.0000 | MEDICATED_PAD | Freq: Once | CUTANEOUS | Status: AC
  Administered 2016-08-17: 6 via TOPICAL

## 2016-08-16 MED ORDER — ONDANSETRON 4 MG PO TBDP
4.0000 mg | ORAL_TABLET | Freq: Three times a day (TID) | ORAL | Status: DC | PRN
  Administered 2016-08-16 – 2016-08-17 (×3): 4 mg via ORAL
  Filled 2016-08-16 (×3): qty 1

## 2016-08-16 MED ORDER — SODIUM CHLORIDE 0.9% FLUSH: 3.0000 mL | Freq: Two times a day (BID) | INTRAVENOUS | Status: DC

## 2016-08-16 MED ORDER — SODIUM CHLORIDE 0.9 % IV SOLN
INTRAVENOUS | Status: DC
  Administered 2016-08-16: 14:00:00 via INTRAVENOUS

## 2016-08-16 MED ORDER — DEXTROSE 5 % IV SOLN: 2.0000 g | INTRAVENOUS | Status: DC

## 2016-08-16 MED ORDER — INSULIN ASPART 100 UNIT/ML ~~LOC~~ SOLN: 0.0000 [IU] | Freq: Three times a day (TID) | SUBCUTANEOUS | Status: DC

## 2016-08-16 MED ORDER — ACETAMINOPHEN 500 MG PO TABS
1000.0000 mg | ORAL_TABLET | Freq: Four times a day (QID) | ORAL | Status: DC | PRN
  Administered 2016-08-16 – 2016-08-18 (×3): 1000 mg via ORAL
  Filled 2016-08-16 (×3): qty 2

## 2016-08-16 MED ORDER — INSULIN ASPART 100 UNIT/ML ~~LOC~~ SOLN: 0.0000 [IU] | Freq: Every day | SUBCUTANEOUS | Status: DC

## 2016-08-16 MED ORDER — ASPIRIN EC 81 MG PO TBEC
81.0000 mg | DELAYED_RELEASE_TABLET | Freq: Every day | ORAL | Status: DC
  Administered 2016-08-18: 81 mg via ORAL
  Filled 2016-08-16 (×2): qty 1

## 2016-08-16 MED ORDER — POTASSIUM CHLORIDE 10 MEQ/50ML IV SOLN
10.0000 meq | INTRAVENOUS | Status: DC | PRN
  Filled 2016-08-16: qty 50

## 2016-08-16 MED ORDER — SODIUM CHLORIDE 0.9% FLUSH
3.0000 mL | Freq: Two times a day (BID) | INTRAVENOUS | Status: DC
  Administered 2016-08-17: 3 mL via INTRAVENOUS

## 2016-08-16 MED ORDER — SODIUM CHLORIDE 0.9 % IV SOLN: 250.0000 mL | INTRAVENOUS | Status: DC | PRN

## 2016-08-16 MED ORDER — CHLORHEXIDINE GLUCONATE CLOTH 2 % EX PADS
6.0000 | MEDICATED_PAD | Freq: Once | CUTANEOUS | Status: AC
Start: 2016-08-16 — End: 2016-08-16
  Administered 2016-08-16: 6 via TOPICAL

## 2016-08-16 MED ORDER — TAMSULOSIN HCL 0.4 MG PO CAPS
0.4000 mg | ORAL_CAPSULE | Freq: Every day | ORAL | Status: DC
  Administered 2016-08-16 – 2016-08-18 (×2): 0.4 mg via ORAL
  Filled 2016-08-16 (×2): qty 1

## 2016-08-16 MED ORDER — SODIUM CHLORIDE 0.9 % IV SOLN
INTRAVENOUS | Status: DC
  Administered 2016-08-16 – 2016-08-17 (×2): via INTRAVENOUS

## 2016-08-16 MED ORDER — SODIUM CHLORIDE 0.9 % IV BOLUS (SEPSIS)
1000.0000 mL | Freq: Once | INTRAVENOUS | Status: AC
  Administered 2016-08-16: 1000 mL via INTRAVENOUS

## 2016-08-16 MED ORDER — ONDANSETRON HCL 4 MG/2ML IJ SOLN
4.0000 mg | Freq: Once | INTRAMUSCULAR | Status: AC
  Administered 2016-08-16: 4 mg via INTRAVENOUS
  Filled 2016-08-16: qty 2

## 2016-08-16 NOTE — Progress Notes (Addendum)
CRITICAL VALUE ALERT  Critical Value:  K - 2.7  Date & Time Notied:  08/16/2016. 7:23 PM   Provider Notified: Francee Gentile / floor coverage [employer]   [email or web address].   [email or web address]  Orders Received/Actions taken: alerted MD and RN, Marolyn Hammock

## 2016-08-16 NOTE — ED Triage Notes (Signed)
Pt c/o rt lower quad abdominal pain since last night.  Pt states bile like substance.
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
## 2016-08-17 ENCOUNTER — Encounter (HOSPITAL_COMMUNITY)
Admission: EM | Disposition: A | Discharge status: Home / Self Care | Payer: Self-pay | Source: Home / Self Care | Attending: Emergency Medicine

## 2016-08-17 ENCOUNTER — Observation Stay (HOSPITAL_COMMUNITY): Payer: PPO | Admitting: Certified Registered Nurse Anesthetist

## 2016-08-17 ENCOUNTER — Observation Stay (HOSPITAL_COMMUNITY): Payer: PPO

## 2016-08-17 ENCOUNTER — Encounter (HOSPITAL_COMMUNITY): Payer: Self-pay | Admitting: Certified Registered"

## 2016-08-17 DIAGNOSIS — G4733 Obstructive sleep apnea (adult) (pediatric): Secondary | ICD-10-CM | POA: Diagnosis not present

## 2016-08-17 DIAGNOSIS — Z79899 Other long term (current) drug therapy: Secondary | ICD-10-CM | POA: Diagnosis not present

## 2016-08-17 DIAGNOSIS — E876 Hypokalemia: Secondary | ICD-10-CM | POA: Diagnosis not present

## 2016-08-17 DIAGNOSIS — K810 Acute cholecystitis: Secondary | ICD-10-CM | POA: Diagnosis not present

## 2016-08-17 DIAGNOSIS — K589 Irritable bowel syndrome without diarrhea: Secondary | ICD-10-CM | POA: Diagnosis not present

## 2016-08-17 DIAGNOSIS — I10 Essential (primary) hypertension: Secondary | ICD-10-CM | POA: Diagnosis not present

## 2016-08-17 DIAGNOSIS — K8000 Calculus of gallbladder with acute cholecystitis without obstruction: Secondary | ICD-10-CM | POA: Diagnosis not present

## 2016-08-17 DIAGNOSIS — K219 Gastro-esophageal reflux disease without esophagitis: Secondary | ICD-10-CM | POA: Diagnosis not present

## 2016-08-17 DIAGNOSIS — R739 Hyperglycemia, unspecified: Secondary | ICD-10-CM | POA: Diagnosis not present

## 2016-08-17 DIAGNOSIS — L409 Psoriasis, unspecified: Secondary | ICD-10-CM | POA: Diagnosis not present

## 2016-08-17 DIAGNOSIS — I700 Atherosclerosis of aorta: Secondary | ICD-10-CM | POA: Diagnosis not present

## 2016-08-17 DIAGNOSIS — K4090 Unilateral inguinal hernia, without obstruction or gangrene, not specified as recurrent: Secondary | ICD-10-CM | POA: Diagnosis not present

## 2016-08-17 DIAGNOSIS — N400 Enlarged prostate without lower urinary tract symptoms: Secondary | ICD-10-CM | POA: Diagnosis not present

## 2016-08-17 DIAGNOSIS — K819 Cholecystitis, unspecified: Secondary | ICD-10-CM

## 2016-08-17 HISTORY — PX: CHOLECYSTECTOMY: SHX55

## 2016-08-17 LAB — CBC
HEMATOCRIT: 38.1 % — AB (ref 39.0–52.0)
Hemoglobin: 13.2 g/dL (ref 13.0–17.0)
MCH: 32.4 pg (ref 26.0–34.0)
MCHC: 34.6 g/dL (ref 30.0–36.0)
MCV: 93.6 fL (ref 78.0–100.0)
PLATELETS: 120 10*3/uL — AB (ref 150–400)
RBC: 4.07 MIL/uL — ABNORMAL LOW (ref 4.22–5.81)
RDW: 13.8 % (ref 11.5–15.5)
WBC: 11.4 10*3/uL — ABNORMAL HIGH (ref 4.0–10.5)

## 2016-08-17 LAB — COMPREHENSIVE METABOLIC PANEL
ALBUMIN: 3.4 g/dL — AB (ref 3.5–5.0)
ALK PHOS: 59 U/L (ref 38–126)
ALT: 26 U/L (ref 17–63)
AST: 34 U/L (ref 15–41)
Anion gap: 11 (ref 5–15)
BILIRUBIN TOTAL: 1.3 mg/dL — AB (ref 0.3–1.2)
BUN: 26 mg/dL — AB (ref 6–20)
CALCIUM: 8.4 mg/dL — AB (ref 8.9–10.3)
CO2: 28 mmol/L (ref 22–32)
CREATININE: 1.19 mg/dL (ref 0.61–1.24)
Chloride: 99 mmol/L — ABNORMAL LOW (ref 101–111)
GFR calc Af Amer: >60 mL/min (ref >60)
GFR calc non Af Amer: >60 mL/min (ref >60)
GLUCOSE: 136 mg/dL — AB (ref 65–99)
Potassium: 3 mmol/L — ABNORMAL LOW (ref 3.5–5.1)
Sodium: 138 mmol/L (ref 135–145)
TOTAL PROTEIN: 6.1 g/dL — AB (ref 6.5–8.1)

## 2016-08-17 LAB — SURGICAL PCR SCREEN
MRSA, PCR: NEGATIVE
STAPHYLOCOCCUS AUREUS: NEGATIVE

## 2016-08-17 LAB — GLUCOSE, CAPILLARY
GLUCOSE-CAPILLARY: 116 mg/dL — AB (ref 65–99)
GLUCOSE-CAPILLARY: 141 mg/dL — AB (ref 65–99)
GLUCOSE-CAPILLARY: 200 mg/dL — AB (ref 65–99)
Glucose-Capillary: 124 mg/dL — ABNORMAL HIGH (ref 65–99)

## 2016-08-17 LAB — MAGNESIUM: MAGNESIUM: 2.1 mg/dL (ref 1.7–2.4)

## 2016-08-17 SURGERY — LAPAROSCOPIC CHOLECYSTECTOMY WITH INTRAOPERATIVE CHOLANGIOGRAM
Anesthesia: General

## 2016-08-17 MED ORDER — LIDOCAINE 2% (20 MG/ML) 5 ML SYRINGE
INTRAMUSCULAR | Status: DC | PRN
  Administered 2016-08-17: 100 mg via INTRAVENOUS

## 2016-08-17 MED ORDER — IOPAMIDOL (ISOVUE-300) INJECTION 61%
INTRAVENOUS | Status: AC
  Filled 2016-08-17: qty 50

## 2016-08-17 MED ORDER — FENTANYL CITRATE (PF) 100 MCG/2ML IJ SOLN
INTRAMUSCULAR | Status: DC | PRN
  Administered 2016-08-17: 100 ug via INTRAVENOUS
  Administered 2016-08-17 (×3): 50 ug via INTRAVENOUS

## 2016-08-17 MED ORDER — BUPIVACAINE-EPINEPHRINE (PF) 0.25% -1:200000 IJ SOLN
INTRAMUSCULAR | Status: AC
  Filled 2016-08-17: qty 30

## 2016-08-17 MED ORDER — MIDAZOLAM HCL 5 MG/5ML IJ SOLN
INTRAMUSCULAR | Status: DC | PRN
  Administered 2016-08-17: 2 mg via INTRAVENOUS

## 2016-08-17 MED ORDER — 0.9 % SODIUM CHLORIDE (POUR BTL) OPTIME
TOPICAL | Status: DC | PRN
  Administered 2016-08-17: 1000 mL

## 2016-08-17 MED ORDER — ONDANSETRON HCL 4 MG/2ML IJ SOLN
INTRAMUSCULAR | Status: DC | PRN
  Administered 2016-08-17: 4 mg via INTRAVENOUS

## 2016-08-17 MED ORDER — DEXAMETHASONE SODIUM PHOSPHATE 10 MG/ML IJ SOLN
INTRAMUSCULAR | Status: DC | PRN
  Administered 2016-08-17: 10 mg via INTRAVENOUS

## 2016-08-17 MED ORDER — FENTANYL CITRATE (PF) 100 MCG/2ML IJ SOLN
25.0000 ug | INTRAMUSCULAR | Status: DC | PRN
  Administered 2016-08-17 (×2): 50 ug via INTRAVENOUS

## 2016-08-17 MED ORDER — MORPHINE SULFATE (PF) 4 MG/ML IV SOLN
1.0000 mg | INTRAVENOUS | Status: DC | PRN
  Administered 2016-08-17: 4 mg via INTRAVENOUS
  Filled 2016-08-17: qty 1

## 2016-08-17 MED ORDER — BUPIVACAINE-EPINEPHRINE 0.25% -1:200000 IJ SOLN
INTRAMUSCULAR | Status: DC | PRN
  Administered 2016-08-17: 20 mL

## 2016-08-17 MED ORDER — LACTATED RINGERS IV SOLN
INTRAVENOUS | Status: DC
  Administered 2016-08-17 (×2): via INTRAVENOUS

## 2016-08-17 MED ORDER — DEXAMETHASONE SODIUM PHOSPHATE 10 MG/ML IJ SOLN
INTRAMUSCULAR | Status: AC
  Filled 2016-08-17: qty 1

## 2016-08-17 MED ORDER — MIDAZOLAM HCL 2 MG/2ML IJ SOLN
INTRAMUSCULAR | Status: AC
  Filled 2016-08-17: qty 2

## 2016-08-17 MED ORDER — ROCURONIUM BROMIDE 50 MG/5ML IV SOSY
PREFILLED_SYRINGE | INTRAVENOUS | Status: AC
  Filled 2016-08-17: qty 5

## 2016-08-17 MED ORDER — SUCCINYLCHOLINE CHLORIDE 200 MG/10ML IV SOSY
PREFILLED_SYRINGE | INTRAVENOUS | Status: AC
  Filled 2016-08-17: qty 10

## 2016-08-17 MED ORDER — ROCURONIUM BROMIDE 10 MG/ML (PF) SYRINGE
PREFILLED_SYRINGE | INTRAVENOUS | Status: DC | PRN
  Administered 2016-08-17: 35 mg via INTRAVENOUS
  Administered 2016-08-17: 5 mg via INTRAVENOUS

## 2016-08-17 MED ORDER — PROPOFOL 10 MG/ML IV BOLUS
INTRAVENOUS | Status: AC
  Filled 2016-08-17: qty 20

## 2016-08-17 MED ORDER — SUGAMMADEX SODIUM 200 MG/2ML IV SOLN
INTRAVENOUS | Status: DC | PRN
  Administered 2016-08-17: 200 mg via INTRAVENOUS

## 2016-08-17 MED ORDER — POTASSIUM CHLORIDE IN NACL 40-0.9 MEQ/L-% IV SOLN
INTRAVENOUS | Status: DC
  Administered 2016-08-17 (×2): 75 mL/h via INTRAVENOUS
  Filled 2016-08-17 (×3): qty 1000

## 2016-08-17 MED ORDER — FENTANYL CITRATE (PF) 100 MCG/2ML IJ SOLN
INTRAMUSCULAR | Status: AC
  Filled 2016-08-17: qty 2

## 2016-08-17 MED ORDER — SUCCINYLCHOLINE CHLORIDE 200 MG/10ML IV SOSY
PREFILLED_SYRINGE | INTRAVENOUS | Status: DC | PRN
  Administered 2016-08-17: 120 mg via INTRAVENOUS

## 2016-08-17 MED ORDER — LACTATED RINGERS IR SOLN
Status: DC | PRN
  Administered 2016-08-17: 1000 mL

## 2016-08-17 MED ORDER — ONDANSETRON HCL 4 MG/2ML IJ SOLN
INTRAMUSCULAR | Status: AC
  Filled 2016-08-17: qty 2

## 2016-08-17 MED ORDER — PROPOFOL 10 MG/ML IV BOLUS
INTRAVENOUS | Status: DC | PRN
  Administered 2016-08-17: 200 mg via INTRAVENOUS

## 2016-08-17 MED ORDER — FENTANYL CITRATE (PF) 250 MCG/5ML IJ SOLN
INTRAMUSCULAR | Status: AC
  Filled 2016-08-17: qty 5

## 2016-08-17 MED ORDER — POTASSIUM CHLORIDE 20 MEQ/15ML (10%) PO SOLN
40.0000 meq | Freq: Two times a day (BID) | ORAL | Status: AC
  Administered 2016-08-17 (×2): 40 meq via ORAL
  Filled 2016-08-17 (×2): qty 30

## 2016-08-17 SURGICAL SUPPLY — 36 items
ADH SKN CLS APL DERMABOND .7 (GAUZE/BANDAGES/DRESSINGS) ×1
APPLIER CLIP 5 13 M/L LIGAMAX5 (MISCELLANEOUS) ×3
APR CLP MED LRG 5 ANG JAW (MISCELLANEOUS) ×1
BAG SPEC RTRVL LRG 6X4 10 (ENDOMECHANICALS) ×1
CABLE HIGH FREQUENCY MONO STRZ (ELECTRODE) ×3 IMPLANT
CATH REDDICK CHOLANGI 4FR 50CM (CATHETERS) ×1 IMPLANT
CHLORAPREP W/TINT 26ML (MISCELLANEOUS) ×3 IMPLANT
CLIP APPLIE 5 13 M/L LIGAMAX5 (MISCELLANEOUS) ×1 IMPLANT
COVER MAYO STAND STRL (DRAPES) ×3 IMPLANT
DECANTER SPIKE VIAL GLASS SM (MISCELLANEOUS) ×3 IMPLANT
DERMABOND ADVANCED (GAUZE/BANDAGES/DRESSINGS) ×2
DERMABOND ADVANCED .7 DNX12 (GAUZE/BANDAGES/DRESSINGS) ×1 IMPLANT
DRAIN CHANNEL 19F RND (DRAIN) ×2 IMPLANT
DRAPE C-ARM 42X120 X-RAY (DRAPES) ×3 IMPLANT
DRSG TEGADERM 4X4.75 (GAUZE/BANDAGES/DRESSINGS) ×2 IMPLANT
ELECT REM PT RETURN 15FT ADLT (MISCELLANEOUS) ×3 IMPLANT
EVACUATOR SILICONE 100CC (DRAIN) ×2 IMPLANT
GAUZE SPONGE 2X2 8PLY STRL LF (GAUZE/BANDAGES/DRESSINGS) IMPLANT
GLOVE BIO SURGEON STRL SZ7.5 (GLOVE) ×3 IMPLANT
GOWN STRL REUS W/TWL XL LVL3 (GOWN DISPOSABLE) ×9 IMPLANT
HEMOSTAT SURGICEL 4X8 (HEMOSTASIS) ×2 IMPLANT
IV CATH 14GX2 1/4 (CATHETERS) ×3 IMPLANT
KIT BASIN OR (CUSTOM PROCEDURE TRAY) ×3 IMPLANT
POUCH SPECIMEN RETRIEVAL 10MM (ENDOMECHANICALS) ×3 IMPLANT
SCISSORS LAP 5X35 DISP (ENDOMECHANICALS) ×3 IMPLANT
SET IRRIG TUBING LAPAROSCOPIC (IRRIGATION / IRRIGATOR) ×2 IMPLANT
SLEEVE XCEL OPT CAN 5 100 (ENDOMECHANICALS) ×6 IMPLANT
SPONGE GAUZE 2X2 STER 10/PKG (GAUZE/BANDAGES/DRESSINGS) ×2
SUT ETHILON 2 0 PS N (SUTURE) ×2 IMPLANT
SUT MNCRL AB 4-0 PS2 18 (SUTURE) ×3 IMPLANT
TOWEL OR 17X26 10 PK STRL BLUE (TOWEL DISPOSABLE) ×3 IMPLANT
TOWEL OR NON WOVEN STRL DISP B (DISPOSABLE) ×3 IMPLANT
TRAY LAPAROSCOPIC (CUSTOM PROCEDURE TRAY) ×3 IMPLANT
TROCAR BLADELESS OPT 5 100 (ENDOMECHANICALS) ×3 IMPLANT
TROCAR XCEL BLUNT TIP 100MML (ENDOMECHANICALS) ×3 IMPLANT
TUBING INSUF HEATED (TUBING) ×3 IMPLANT

## 2016-08-17 NOTE — Plan of Care (Signed)
Problem: Education: Goal: Knowledge of [facility] Education information/materials will improve Outcome: Completed/Met Date Met: 08/17/16 .   Problem: Safety: Goal: Ability to remain free from injury will improve Continue.    Problem: Health Behavior/Discharge Planning: Goal: Ability to manage health-related needs will improve Outcome: Progressing .  Problem: Pain Managment: Goal: General experience of comfort will improve Continue.  Pt rating abdominal pain 6/10, sharp pain. Getting Toradol IV Q 6 hours prn.  Continue to monitor.   Problem: Physical Regulation: Goal: Ability to maintain clinical measurements within normal limits will improve Continue. Goal: Will remain free from infection Continue.   Problem: Skin Integrity: Goal: Risk for impaired skin integrity will decrease Outcome: Progressing Pt educated on importance of repositioning while in bed.   Problem: Tissue Perfusion: Goal: Risk factors for ineffective tissue perfusion will decrease Outcome: Progressing On SCD's.   Hold heparin due to pending surgery.   Problem: Activity: Goal: Risk for activity intolerance will decrease Independent with ADL's, may change post op.   Continue.   Problem: Fluid Volume: Goal: Ability to maintain a balanced intake and output will improve Continue.  Problem: Nutrition: Goal: Adequate nutrition will be maintained NPO now for surgery later today.  Continue.   Problem: Bowel/Gastric: Goal: Will not experience complications related to bowel motility Continue.

## 2016-08-17 NOTE — Transfer of Care (Signed)
Immediate Anesthesia Transfer of Care Note  [name].  Procedure(s) Performed: Procedure(s): LAPAROSCOPIC CHOLECYSTECTOMY (N/A)  Patient Location: PACU  Anesthesia Type:General  Level of Consciousness: awake, alert  and oriented  Airway & Oxygen Therapy: Patient Spontanous Breathing and Patient connected to face mask oxygen  Post-op Assessment: Report given to RN and Post -op Vital signs reviewed and stable  Post vital signs: Reviewed and stable  Last Vitals:  Vitals:   08/16/16 2219 08/17/16 0540  BP: 103/77 106/70  Pulse: 89 88  Resp: 18 18  Temp: 37.6 C 37 C    Last Pain:  Vitals:   08/17/16 1101  TempSrc:   PainSc: Asleep      Patients Stated Pain Goal: 4 (09/[birth year]/49 9718)  Complications: No apparent anesthesia complications

## 2016-08-17 NOTE — Interval H&P Note (Signed)
History and Physical Interval Note:  08/17/2016 3:01 PM  Brandon Schroeder.  has presented today for surgery, with the diagnosis of acute cholecystitis  The various methods of treatment have been discussed with the patient and family. After consideration of risks, benefits and other options for treatment, the patient has consented to  Procedure(s): LAPAROSCOPIC CHOLECYSTECTOMY WITH INTRAOPERATIVE CHOLANGIOGRAM (N/A) as a surgical intervention .  The patient's history has been reviewed, patient examined, no change in status, stable for surgery.  I have reviewed the patient's chart and labs.  Questions were answered to the patient's satisfaction.     TOTH III,PAUL S

## 2016-08-17 NOTE — Anesthesia Postprocedure Evaluation (Signed)
Anesthesia Post Note  [name].  Procedure(s) Performed: Procedure(s) (LRB): LAPAROSCOPIC CHOLECYSTECTOMY (N/A)     Patient location during evaluation: PACU Anesthesia Type: General Level of consciousness: awake Pain management: pain level controlled Vital Signs Assessment: post-procedure vital signs reviewed and stable Respiratory status: spontaneous breathing Cardiovascular status: stable Anesthetic complications: no    Last Vitals:  Vitals:   08/17/16 1730 08/17/16 1745  BP: 124/71 114/77  Pulse: 89 91  Resp: (!) 26 11  Temp:  36.9 C    Last Pain:  Vitals:   08/17/16 1752  TempSrc:   PainSc: 1                  Baleria Wyman

## 2016-08-17 NOTE — Progress Notes (Signed)
[facility] PROGRESS NOTE  Elad Macphail. MEB:[id number] DOB: 1946/01/26 DOA: 08/16/2016   PCP: Laurey Morale, MD     Assessment/Plan: Principal Problem:   Acute cholecystitis Active Problems:   Hypokalemia   Hyperglycemia    71 y.o. male with medical history significant for but not limited to history of renal calculi presenting with 2 day history of moderate to severe aching right upper quadrant abdominal pain associated with bilious vomiting, nausea, chills without fever. Patient admitted for acute cholecystitis. Schedule for laparoscopic cholecystectomy with IOC  Assessment and plan #1 acute cholecystitis: Continues to have right upper quadrant pain NPO  , awaiting surgery this afternoon On ceftriaxone, may DC after surgery, defer to surgery to make a decision    #2 severe hypokalemia: Replete aggressively, magnesium repleted yesterday  #3 anxiety and depression-continue Zoloft  #4 gastroesophageal reflux disease  #5 psoriasis , on methotrexate at home  #6 sleep apnea, on Cpap  #7 Hypertension-hold HCTZ due to hypokalemia, continue Norvasc     DVT prophylaxsis heparin  Code Status:  Full code   Family Communication: Discussed in detail with the patient, all imaging results, lab results explained to the patient   Disposition Plan: As per surgery      Consultants:  General surgery  Procedures:  None  Antibiotics: Anti-infectives    Start     Dose/Rate Route Frequency Ordered Stop   08/17/16 1400  cefTRIAXone (ROCEPHIN) 2 g in dextrose 5 % 50 mL IVPB     2 g 100 mL/hr over 30 Minutes Intravenous Every 24 hours 08/16/16 1704     08/16/16 1700  cefTRIAXone (ROCEPHIN) 2 g in dextrose 5 % 50 mL IVPB  Status:  Discontinued     2 g 100 mL/hr over 30 Minutes Intravenous Every 24 hours 08/16/16 1658 08/16/16 1704   08/16/16 1515  cefTRIAXone (ROCEPHIN) 2 g in dextrose 5 % 50 mL IVPB     2 g 100 mL/hr over 30 Minutes Intravenous  Once  08/16/16 1500 08/16/16 1606         HPI/Subjective: Continues to have some right upper quadrant pain and low-grade fever, npo for surgery  Objective: Vitals:   08/16/16 1605 08/16/16 1825 08/16/16 2219 08/17/16 0540  BP: (!) 154/91 (!) 144/93 103/77 106/70  Pulse: 98 98 89 88  Resp: 16 16 18 18   Temp:  (!) 100.4 F (38 C) 99.7 F (37.6 C) 98.6 F (37 C)  TempSrc:  Oral Oral Oral  SpO2: 94% 95% 94% 97%  Weight:  101.4 kg (223 lb 8.7 oz)    Height:  6\' 3"  (1.905 m)      Intake/Output Summary (Last 24 hours) at 08/17/16 0848 Last data filed at 08/17/16 0700  Gross per 24 hour  Intake           1512.5 ml  Output                0 ml  Net           1512.5 ml    Exam:  Examination:  General exam: Appears calm and comfortable  Respiratory system: Clear to auscultation. Respiratory effort normal. Cardiovascular system: S1 & S2 heard, RRR. No JVD, murmurs, rubs, gallops or clicks. No pedal edema. Gastrointestinal system: Abdomen is nondistended, soft and nontender. No organomegaly or masses felt. Normal bowel sounds heard. Central nervous system: Alert and oriented. No focal neurological deficits. Extremities: Symmetric 5 x 5 power. Skin:  No rashes, lesions or ulcers Psychiatry: Judgement and insight appear normal. Mood & affect appropriate.     Data Reviewed: I have personally reviewed following labs and imaging studies  Micro Results Recent Results (from the past 240 hour(s))  Surgical pcr screen     Status: None   Collection Time: 08/16/16 10:16 PM  Result Value Ref Range Status   MRSA, PCR NEGATIVE NEGATIVE Final   Staphylococcus aureus NEGATIVE NEGATIVE Final    Comment:        The Xpert SA Assay (FDA approved for NASAL specimens in patients over 94 years of age), is one component of a comprehensive surveillance program.  Test performance has been validated by [facility] for patients greater than or equal to 53 year old. It is not intended to diagnose  infection nor to guide or monitor treatment.     Radiology Reports Ct Renal Stone Study  Addendum Date: 08/16/2016   ADDENDUM REPORT: 08/16/2016 14:52 ADDENDUM: These results were called by telephone at the time of interpretation on 08/16/2016 at 2:52 pm to Dr. Dorie Rank , who verbally acknowledged these results. Electronically Signed   By: Fidela Salisbury M.D.   On: 08/16/2016 14:52   Result Date: 08/16/2016 CLINICAL DATA:  Right lower quadrant abdominal pain since last night. Vomiting. EXAM: CT ABDOMEN AND PELVIS WITHOUT CONTRAST TECHNIQUE: Multidetector CT imaging of the abdomen and pelvis was performed following the standard protocol without IV contrast. COMPARISON:  04/20/2016 FINDINGS: Lower chest: Mild chronic interstitial lung changes. Calcific atherosclerotic disease of the coronary arteries. Hepatobiliary: The liver has normal nonenhanced appearance. The gallbladder wall is distended to 5.1 cm. Gallbladder wall is indistinct. Inflammatory stranding is seen in porta hepaticus. Pancreas: Unremarkable. No pancreatic ductal dilatation or surrounding inflammatory changes. Spleen: Normal in size without focal abnormality. Adrenals/Urinary Tract: Adrenal glands are unremarkable. No evidence of obstructive uropathy. Two nonobstructing few mm calculi are seen within the left kidney. Tiny 1-2 mm nonobstructive calculus in the lower pole of the right kidney. Nonspecific bilateral perirenal fat stranding. Bladder is unremarkable. Stomach/Bowel: Stomach is within normal limits. Inflammatory fat stranding surrounds the versus second portion of the duodenum. Appendix appears normal. No evidence of bowel wall thickening, distention, or inflammatory changes. Scatter left colonic diverticulosis without evidence of diverticulitis. Vascular/Lymphatic: Aortic atherosclerosis. No enlarged abdominal or pelvic lymph nodes. Reproductive: Enlarged heterogeneous prostate. Other: No abdominal wall hernia or abnormality. No  abdominopelvic ascites. Musculoskeletal: No significant change of previously described compression fractures of T12, L2 and L4 vertebral bodies. 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 IMPRESSION: Abnormal distension and indistinctness of the gallbladder wall with inflammatory fat stranding in the liver hilum and surrounding the first and second portion of the duodenum. Findings are suspicious for acute cholecystitis/duodenitis. Please correlate clinically. No evidence of obstructive uropathy. Bilateral nonobstructive nephrolithiasis. Nonspecific perirenal fat stranding. Enlarged prostate gland.  Please correlate to PSA values. Calcific atherosclerotic disease of the coronary arteries. 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 Electronically Signed: By: Fidela Salisbury M.D. On: 08/16/2016 14:49     CBC  Recent Labs Lab 08/16/16 1305 08/17/16 0449  WBC 11.1* 11.4*  HGB 16.0 13.2  HCT 43.4 38.1*  PLT 124* 120*  MCV 93.3 93.6  MCH 34.4* 32.4  MCHC 36.9* 34.6  RDW 13.6 13.8    Chemistries   Recent Labs Lab 08/16/16 1305 08/16/16 1813 08/16/16 2054 08/17/16 0449  NA 136 138  --  138  K 2.7* 2.7*  --  3.0*  CL 94*  96*  --  99*  CO2 29 29  --  28  GLUCOSE 218* 167*  --  136*  BUN 21* 19  --  26*  CREATININE 1.20 1.18  --  1.19  CALCIUM 9.3 8.7*  --  8.4*  MG 1.4*  --  1.5* 2.1  AST 28  --   --  34  ALT 20  --   --  26  ALKPHOS 77  --   --  59  BILITOT 1.9*  --   --  1.3*   ------------------------------------------------------------------------------------------------------------------ estimated creatinine clearance is 75.7 mL/min (by C-G formula based on SCr of 1.19 mg/dL). ------------------------------------------------------------------------------------------------------------------ No results for input(s): HGBA1C in the last 72  hours. ------------------------------------------------------------------------------------------------------------------ No results for input(s): CHOL, HDL, LDLCALC, TRIG, CHOLHDL, LDLDIRECT in the last 72 hours. ------------------------------------------------------------------------------------------------------------------  Recent Labs  08/16/16 2054  TSH 0.667   ------------------------------------------------------------------------------------------------------------------ No results for input(s): VITAMINB12, FOLATE, FERRITIN, TIBC, IRON, RETICCTPCT in the last 72 hours.  Coagulation profile  Recent Labs Lab 08/16/16 2054  INR 1.18    No results for input(s): DDIMER in the last 72 hours.  Cardiac Enzymes No results for input(s): CKMB, TROPONINI, MYOGLOBIN in the last 168 hours.  Invalid input(s): CK ------------------------------------------------------------------------------------------------------------------ Invalid input(s): POCBNP   CBG:  Recent Labs Lab 08/16/16 2217 08/17/16 0819  GLUCAP 153* 116*       Studies: Ct Renal Stone Study  Addendum Date: 08/16/2016   ADDENDUM REPORT: 08/16/2016 14:52 ADDENDUM: These results were called by telephone at the time of interpretation on 08/16/2016 at 2:52 pm to Dr. Dorie Rank , who verbally acknowledged these results. Electronically Signed   By: Fidela Salisbury M.D.   On: 08/16/2016 14:52   Result Date: 08/16/2016 CLINICAL DATA:  Right lower quadrant abdominal pain since last night. Vomiting. EXAM: CT ABDOMEN AND PELVIS WITHOUT CONTRAST TECHNIQUE: Multidetector CT imaging of the abdomen and pelvis was performed following the standard protocol without IV contrast. COMPARISON:  04/20/2016 FINDINGS: Lower chest: Mild chronic interstitial lung changes. Calcific atherosclerotic disease of the coronary arteries. Hepatobiliary: The liver has normal nonenhanced appearance. The gallbladder wall is distended to 5.1 cm.  Gallbladder wall is indistinct. Inflammatory stranding is seen in porta hepaticus. Pancreas: Unremarkable. No pancreatic ductal dilatation or surrounding inflammatory changes. Spleen: Normal in size without focal abnormality. Adrenals/Urinary Tract: Adrenal glands are unremarkable. No evidence of obstructive uropathy. Two nonobstructing few mm calculi are seen within the left kidney. Tiny 1-2 mm nonobstructive calculus in the lower pole of the right kidney. Nonspecific bilateral perirenal fat stranding. Bladder is unremarkable. Stomach/Bowel: Stomach is within normal limits. Inflammatory fat stranding surrounds the versus second portion of the duodenum. Appendix appears normal. No evidence of bowel wall thickening, distention, or inflammatory changes. Scatter left colonic diverticulosis without evidence of diverticulitis. Vascular/Lymphatic: Aortic atherosclerosis. No enlarged abdominal or pelvic lymph nodes. Reproductive: Enlarged heterogeneous prostate. Other: No abdominal wall hernia or abnormality. No abdominopelvic ascites. Musculoskeletal: No significant change of previously described compression fractures of T12, L2 and L4 vertebral bodies. 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 IMPRESSION: Abnormal distension and indistinctness of the gallbladder wall with inflammatory fat stranding in the liver hilum and surrounding the first and second portion of the duodenum. Findings are suspicious for acute cholecystitis/duodenitis. Please correlate clinically. No evidence of obstructive uropathy. Bilateral nonobstructive nephrolithiasis. Nonspecific perirenal fat stranding. Enlarged prostate gland.  Please correlate to PSA values. Calcific atherosclerotic disease of the coronary arteries. 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 Electronically Signed: By: Thomas Hoff  Dimitrova M.D. On: 08/16/2016 14:49      No results found for: HGBA1C Lab Results  Component Value Date   LDLCALC 95  02/19/2016   CREATININE 1.19 08/17/2016       Scheduled Meds: . amLODipine  10 mg Oral Daily  . aspirin EC  81 mg Oral Daily  . fluticasone  1 spray Each Nare Daily  . gabapentin  100 mg Oral QID  . heparin  5,000 Units Subcutaneous Once  . insulin aspart  0-5 Units Subcutaneous QHS  . insulin aspart  0-9 Units Subcutaneous TID WC  . multivitamin with minerals  1 tablet Oral Daily  . potassium chloride  40 mEq Oral BID  . sertraline  100 mg Oral BID  . sodium chloride flush  3 mL Intravenous Q12H  . sodium chloride flush  3 mL Intravenous Q12H  . tamsulosin  0.4 mg Oral Daily   Continuous Infusions: . sodium chloride 125 mL/hr at 08/16/16 1411  . sodium chloride 125 mL/hr at 08/17/16 0555  . sodium chloride    . 0.9 % NaCl with KCl 40 mEq / L 75 mL/hr (08/17/16 0846)  . cefTRIAXone (ROCEPHIN)  IV    . potassium chloride       LOS: 1 day    Time spent: >30 MINS    Reyne Dumas  [facility] Pager [phone]. If 7PM-7AM, please contact night-coverage at [email or web address], password [facility] 08/17/2016, 8:48 AM  LOS: 1 day

## 2016-08-17 NOTE — Care Management CC44 (Signed)
Condition Code 44 Documentation Completed  Patient Details  Name: Brandon Schroeder. MRN: [id number] Date of Birth: Oct 21, 1945   Condition Code 44 given:  Yes Patient signature on Condition Code 44 notice:  Yes Documentation of 2 MD's agreement:  Yes Code 44 added to claim:  Yes    Lynnell Catalan, RN 08/17/2016, 1:55 PM
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
## 2016-08-17 NOTE — Op Note (Signed)
08/16/2016 - 08/17/2016  4:59 PM  PATIENT:  Brandon Schroeder.  71 y.o. male  PRE-OPERATIVE DIAGNOSIS:  acute cholecystitis  POST-OPERATIVE DIAGNOSIS:  acute cholecystitis  PROCEDURE:  Procedure(s): LAPAROSCOPIC CHOLECYSTECTOMY (N/A)  SURGEON:  Surgeon(s) and Role:    * Jovita Kussmaul, MD - Primary  PHYSICIAN ASSISTANT:   ASSISTANTS: none   ANESTHESIA:   local and general  EBL:  No intake/output data recorded.  BLOOD ADMINISTERED:none  DRAINS: (1) Jackson-Pratt drain(s) with closed bulb suction in the gallbladder bed   LOCAL MEDICATIONS USED:  MARCAINE     SPECIMEN:  Source of Specimen:  gallbladder  DISPOSITION OF SPECIMEN:  PATHOLOGY  COUNTS:  YES  TOURNIQUET:  * No tourniquets in log *  DICTATION: .Dragon Dictation   Procedure: After informed consent was obtained the patient was brought to the operating room and placed in the supine position on the operating room table. After adequate induction of general anesthesia the patient's abdomen was prepped with ChloraPrep allowed to dry and draped in usual sterile manner. An appropriate timeout was performed. The area below the umbilicus was infiltrated with quarter percent  Marcaine. A small incision was made with a 15 blade knife. The incision was carried down through the subcutaneous tissue bluntly with a hemostat and [employer] retractors. The linea alba was identified. The linea alba was incised with a 15 blade knife and each side was grasped with Coker clamps. The preperitoneal space was then probed with a hemostat until the peritoneum was opened and access was gained to the abdominal cavity. A 0 Vicryl pursestring stitch was placed in the fascia surrounding the opening. A Hassan cannula was then placed through the opening and anchored in place with the previously placed Vicryl purse string stitch. The abdomen was insufflated with carbon dioxide without difficulty. A laparoscope was inserted through the [facility] cannula in the  right upper quadrant was inspected. Next the epigastric region was infiltrated with % Marcaine. A small incision was made with a 15 blade knife. A 5 mm port was placed bluntly through this incision into the abdominal cavity under direct vision. Next 2 sites were chosen laterally on the right side of the abdomen for placement of 5 mm ports. Each of these areas was infiltrated with quarter percent Marcaine. Small stab incisions were made with a 15 blade knife. 5 mm ports were then placed bluntly through these incisions into the abdominal cavity under direct vision without difficulty. The gallbladder was severely inflamed and had to be aspirated. A blunt grasper was placed through the lateralmost 5 mm port and used to grasp the dome of the gallbladder and elevated anteriorly and superiorly. Another blunt grasper was placed through the other 5 mm port and used to retract the body and neck of the gallbladder. A dissector was placed through the epigastric port and using the electrocautery the peritoneal reflection at the gallbladder neck was opened. Blunt dissection was then carried out in this area until the gallbladder neck-cystic duct junction was readily identified and a good window was created. The base of the gallbladder was very inflamed and to maintain control I decided to forego the cholangiogram. The anatomy looked clear. 3 clips were placed proximally on the cystic duct and the duct was divided between the 2 sets of clips. Posterior to this the cystic artery was identified and again dissected bluntly in a circumferential manner until a good window  was created. 2 clips were placed proximally and one distally on the artery and  the artery was divided between the 2 sets of clips. Next a laparoscopic hook cautery device was used to separate the gallbladder from the liver bed. Prior to completely detaching the gallbladder from the liver bed the liver bed was inspected and several small bleeding points were  coagulated with the electrocautery until the area was completely hemostatic. The gallbladder was then detached the rest of it from the liver bed without difficulty. A laparoscopic bag was inserted through the hassan port. The laparoscope was moved to the epigastric port. The gallbladder was placed within the bag and the bag was sealed.  The bag with the gallbladder was then removed with the [facility] cannula through the infraumbilical port without difficulty. A piece of surgicel was placed in the liver bed and a drain was brought out through the lateral 88mm port site. The fascial defect was then closed with the previously placed Vicryl pursestring stitch as well as with another figure-of-eight 0 Vicryl stitch. The liver bed was inspected again and found to be hemostatic. The abdomen was irrigated with copious amounts of saline until the effluent was clear. The ports were then removed under direct vision without difficulty and were found to be hemostatic. The gas was allowed to escape. The skin incisions were all closed with interrupted 4-0 Monocryl subcuticular stitches. Dermabond dressings were applied. The patient tolerated the procedure well. At the end of the case all needle sponge and instrument counts were correct. The patient was then awakened and taken to recovery in stable condition  PLAN OF CARE: Admit to inpatient   PATIENT DISPOSITION:  PACU - hemodynamically stable.   Delay start of Pharmacological VTE agent (>24hrs) due to surgical blood loss or risk of bleeding: no

## 2016-08-17 NOTE — Care Management Obs Status (Signed)
[place] NOTIFICATION   Patient Details  Name: Brandon Schroeder. MRN: [id number] Date of Birth: [birth year]   Medicare Observation Status Notification Given:  Yes    Lynnell Catalan, RN 08/17/2016, 1:55 PM

## 2016-08-17 NOTE — Anesthesia Preprocedure Evaluation (Addendum)
Anesthesia Evaluation  Patient identified by MRN, date of birth, ID band Patient awake    Reviewed: Allergy & Precautions, NPO status , Patient's Chart, lab work & pertinent test results  History of Anesthesia Complications (+) PONV  Airway Mallampati: II  TM Distance: >3 FB     Dental   Pulmonary shortness of breath, sleep apnea ,    breath sounds clear to auscultation       Cardiovascular hypertension, + DOE   Rhythm:Regular Rate:Normal     Neuro/Psych    GI/Hepatic Neg liver ROS, GERD  ,  Endo/Other  negative endocrine ROS  Renal/GU Renal disease     Musculoskeletal  (+) Arthritis ,   Abdominal   Peds  Hematology   Anesthesia Other Findings   Reproductive/Obstetrics                             Anesthesia Physical Anesthesia Plan  ASA: III  Anesthesia Plan: General   Post-op Pain Management:    Induction: Intravenous  PONV Risk Score and Plan: 3 and Ondansetron, Dexamethasone, Propofol and Midazolam  Airway Management Planned: Oral ETT  Additional Equipment:   Intra-op Plan:   Post-operative Plan: Possible Post-op intubation/ventilation  Informed Consent: I have reviewed the patients History and Physical, chart, labs and discussed the procedure including the risks, benefits and alternatives for the proposed anesthesia with the patient or authorized representative who has indicated his/her understanding and acceptance.   Dental advisory given  Plan Discussed with: CRNA and Anesthesiologist  Anesthesia Plan Comments:         Anesthesia Quick Evaluation

## 2016-08-17 NOTE — Anesthesia Procedure Notes (Signed)
Procedure Name: Intubation Date/Time: 08/17/2016 3:45 PM Performed by: Noralyn Pick D Pre-anesthesia Checklist: Patient identified, Emergency Drugs available, Suction available and Patient being monitored Patient Re-evaluated:Patient Re-evaluated prior to inductionOxygen Delivery Method: Circle system utilized Preoxygenation: Pre-oxygenation with 100% oxygen Intubation Type: IV induction Ventilation: Mask ventilation without difficulty Laryngoscope Size: Mac and 4 Grade View: Grade I Tube type: Oral Tube size: 7.5 mm Number of attempts: 1 Airway Equipment and Method: Stylet Placement Confirmation: ETT inserted through vocal cords under direct vision,  positive ETCO2 and breath sounds checked- equal and bilateral Secured at: 23 cm Tube secured with: Tape Dental Injury: Teeth and Oropharynx as per pre-operative assessment

## 2016-08-18 DIAGNOSIS — R739 Hyperglycemia, unspecified: Secondary | ICD-10-CM | POA: Diagnosis not present

## 2016-08-18 DIAGNOSIS — K819 Cholecystitis, unspecified: Secondary | ICD-10-CM | POA: Diagnosis not present

## 2016-08-18 DIAGNOSIS — K810 Acute cholecystitis: Secondary | ICD-10-CM | POA: Diagnosis not present

## 2016-08-18 DIAGNOSIS — E876 Hypokalemia: Secondary | ICD-10-CM | POA: Diagnosis not present

## 2016-08-18 LAB — COMPREHENSIVE METABOLIC PANEL
ALBUMIN: 3.1 g/dL — AB (ref 3.5–5.0)
ALT: 36 U/L (ref 17–63)
ANION GAP: 7 (ref 5–15)
AST: 45 U/L — ABNORMAL HIGH (ref 15–41)
Alkaline Phosphatase: 62 U/L (ref 38–126)
BUN: 25 mg/dL — ABNORMAL HIGH (ref 6–20)
CO2: 29 mmol/L (ref 22–32)
Calcium: 8.3 mg/dL — ABNORMAL LOW (ref 8.9–10.3)
Chloride: 101 mmol/L (ref 101–111)
Creatinine, Ser: 1.16 mg/dL (ref 0.61–1.24)
GFR calc non Af Amer: >60 mL/min (ref >60)
GLUCOSE: 154 mg/dL — AB (ref 65–99)
POTASSIUM: 4.2 mmol/L (ref 3.5–5.1)
SODIUM: 137 mmol/L (ref 135–145)
TOTAL PROTEIN: 6.2 g/dL — AB (ref 6.5–8.1)
Total Bilirubin: 0.8 mg/dL (ref 0.3–1.2)

## 2016-08-18 LAB — GLUCOSE, CAPILLARY: Glucose-Capillary: 154 mg/dL — ABNORMAL HIGH (ref 65–99)

## 2016-08-18 LAB — HEMOGLOBIN A1C
HEMOGLOBIN A1C: 6 % — AB (ref 4.8–5.6)
Mean Plasma Glucose: 126 mg/dL

## 2016-08-18 MED ORDER — HYDROCHLOROTHIAZIDE 25 MG PO TABS: 25.0000 mg | ORAL_TABLET | Freq: Every day | ORAL | 3 refills | Status: DC

## 2016-08-18 MED ORDER — BACLOFEN 10 MG PO TABS: 10.0000 mg | ORAL_TABLET | Freq: Every day | ORAL | 1 refills | Status: DC

## 2016-08-18 NOTE — Progress Notes (Signed)
Increased diet to soft diet, tol full liquid breakfast without nausea or vomiting. Pt ambulated several lapse around the unit with wife. Will follow up with diet call [facility] PA. SRP, RN

## 2016-08-18 NOTE — Progress Notes (Signed)
Patient ID: Brandon Schroeder., male   DOB: 21-Dec-1945, 71 y.o.   MRN: [id number]  [facility] Surgery Progress Note  1 Day Post-Op  Subjective: CC- s/p lap chole Asking when he can go home. Patient is tolerating clear liquids and feels hungry. Passing flatus. Abdomen sore but not too painful. Pain well controlled on oral meds. He has ambulated in the halls multiples times.  Objective: Vital signs in last 24 hours: Temp:  [97.9 F (36.6 C)-98.7 F (37.1 C)] 97.9 F (36.6 C) (07/03 0433) Pulse Rate:  [78-91] 78 (07/03 0433) Resp:  [11-33] 16 (07/03 0433) BP: (109-132)/(71-83) 131/83 (07/03 0433) SpO2:  [95 %-100 %] 95 % (07/03 0433) Last BM Date: 08/16/16  Intake/Output from previous day: 07/02 0701 - 07/03 0700 In: 2592.5 [I.V.:2542.5; IV Piggyback:50] Out: 450 [Urine:400; Drains:50] Intake/Output this shift: No intake/output data recorded.  PE: Gen:  Alert, NAD, pleasant HEENT: EOM's intact, pupils equal  Pulm:  effort normal Abd: mild distension, appropriately tender, +BS, incisions C/D/I, drain with serosanguinous drainage Ext:  No erythema, edema, or tenderness BUE/BLE Psych: A&Ox3 Skin: no rashes noted, warm and dry  Lab Results:   Recent Labs  08/16/16 1305 08/17/16 0449  WBC 11.1* 11.4*  HGB 16.0 13.2  HCT 43.4 38.1*  PLT 124* 120*   BMET  Recent Labs  08/17/16 0449 08/18/16 0519  NA 138 137  K 3.0* 4.2  CL 99* 101  CO2 28 29  GLUCOSE 136* 154*  BUN 26* 25*  CREATININE 1.19 1.16  CALCIUM 8.4* 8.3*   PT/INR  Recent Labs  08/16/16 2054  LABPROT 15.0  INR 1.18   CMP     Component Value Date/Time   NA 137 08/18/2016 0519   NA 143 02/05/2014 1019   K 4.2 08/18/2016 0519   CL 101 08/18/2016 0519   CO2 29 08/18/2016 0519   GLUCOSE 154 (H) 08/18/2016 0519   BUN 25 (H) 08/18/2016 0519   BUN 12 02/05/2014 1019   CREATININE 1.16 08/18/2016 0519   CREATININE 1.25 12/23/2012 1712   CALCIUM 8.3 (L) 08/18/2016 0519   PROT 6.2 (L)  08/18/2016 0519   ALBUMIN 3.1 (L) 08/18/2016 0519   AST 45 (H) 08/18/2016 0519   ALT 36 08/18/2016 0519   ALKPHOS 62 08/18/2016 0519   BILITOT 0.8 08/18/2016 0519   GFRNONAA >60 08/18/2016 0519   GFRAA >60 08/18/2016 0519   Lipase     Component Value Date/Time   LIPASE 15 08/16/2016 1305       Studies/Results: Ct Renal Stone Study  Addendum Date: 08/16/2016   ADDENDUM REPORT: 08/16/2016 14:52 ADDENDUM: These results were called by telephone at the time of interpretation on 08/16/2016 at 2:52 pm to Dr. Dorie Rank , who verbally acknowledged these results. Electronically Signed   By: Fidela Salisbury M.D.   On: 08/16/2016 14:52   Result Date: 08/16/2016 CLINICAL DATA:  Right lower quadrant abdominal pain since last night. Vomiting. EXAM: CT ABDOMEN AND PELVIS WITHOUT CONTRAST TECHNIQUE: Multidetector CT imaging of the abdomen and pelvis was performed following the standard protocol without IV contrast. COMPARISON:  04/20/2016 FINDINGS: Lower chest: Mild chronic interstitial lung changes. Calcific atherosclerotic disease of the coronary arteries. Hepatobiliary: The liver has normal nonenhanced appearance. The gallbladder wall is distended to 5.1 cm. Gallbladder wall is indistinct. Inflammatory stranding is seen in porta hepaticus. Pancreas: Unremarkable. No pancreatic ductal dilatation or surrounding inflammatory changes. Spleen: Normal in size without focal abnormality. Adrenals/Urinary Tract: Adrenal glands are unremarkable. No  evidence of obstructive uropathy. Two nonobstructing few mm calculi are seen within the left kidney. Tiny 1-2 mm nonobstructive calculus in the lower pole of the right kidney. Nonspecific bilateral perirenal fat stranding. Bladder is unremarkable. Stomach/Bowel: Stomach is within normal limits. Inflammatory fat stranding surrounds the versus second portion of the duodenum. Appendix appears normal. No evidence of bowel wall thickening, distention, or inflammatory changes.  Scatter left colonic diverticulosis without evidence of diverticulitis. Vascular/Lymphatic: Aortic atherosclerosis. No enlarged abdominal or pelvic lymph nodes. Reproductive: Enlarged heterogeneous prostate. Other: No abdominal wall hernia or abnormality. No abdominopelvic ascites. Musculoskeletal: No significant change of previously described compression fractures of T12, L2 and L4 vertebral bodies. 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 IMPRESSION: Abnormal distension and indistinctness of the gallbladder wall with inflammatory fat stranding in the liver hilum and surrounding the first and second portion of the duodenum. Findings are suspicious for acute cholecystitis/duodenitis. Please correlate clinically. No evidence of obstructive uropathy. Bilateral nonobstructive nephrolithiasis. Nonspecific perirenal fat stranding. Enlarged prostate gland.  Please correlate to PSA values. Calcific atherosclerotic disease of the coronary arteries. 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 Electronically Signed: By: Fidela Salisbury M.D. On: 08/16/2016 14:49    Anti-infectives: Anti-infectives    Start     Dose/Rate Route Frequency Ordered Stop   08/17/16 1400  cefTRIAXone (ROCEPHIN) 2 g in dextrose 5 % 50 mL IVPB     2 g 100 mL/hr over 30 Minutes Intravenous Every 24 hours 08/16/16 1704     08/16/16 1700  cefTRIAXone (ROCEPHIN) 2 g in dextrose 5 % 50 mL IVPB  Status:  Discontinued     2 g 100 mL/hr over 30 Minutes Intravenous Every 24 hours 08/16/16 1658 08/16/16 1704   08/16/16 1515  cefTRIAXone (ROCEPHIN) 2 g in dextrose 5 % 50 mL IVPB     2 g 100 mL/hr over 30 Minutes Intravenous  Once 08/16/16 1500 08/16/16 1606       Assessment/Plan Anxiety/depression GERD Psoriasis - on methotrexate at home OSA HTN Hypokalemia - resolved  acute cholecystitis S/p lap chole 7/2 Dr. Marlou Starks - POD 1 - LFTs WNL - drain with 50cc serosanguinous output - will advance patient to full  liquids, and as tolerated from there. If he his able to tolerate a soft diet I feel that from a surgical standpoint he could go home later today. He will follow-up within a week for drain check, and will not need any antibiotics at discharge. Pain rx is on chart.  ID - rocephin 7/1 >> FEN - full liquids, advance as tolerated VTE - heparin   LOS: 1 day    Brandon Schroeder , [facility] Surgery 08/18/2016, 8:23 AM Pager: [phone] Consults: [phone] Mon-Fri 7:00 am-4:30 pm Sat-Sun 7:00 am-11:30 am

## 2016-08-18 NOTE — Discharge Instructions (Signed)
Surgical Drain Home [facility] °Surgical drains are used to remove extra fluid that normally builds up in a surgical wound after surgery. A surgical drain helps to heal a surgical wound. Different kinds of surgical drains include: °· Active drains. These drains use suction to pull drainage away from the surgical wound. Drainage flows through a tube to a container outside of the body. It is important to keep the bulb or the drainage container flat (compressed) at all times, except while you empty it. Flattening the bulb or container creates suction. The two most common types of active drains are bulb drains and Hemovac drains. °· Passive drains. These drains allow fluid to drain naturally, by gravity. Drainage flows through a tube to a bandage (dressing) or a container outside of the body. Passive drains do not need to be emptied. The most common type of passive drain is the Penrose drain. ° °A drain is placed during surgery. Immediately after surgery, drainage is usually bright red and a little thicker than water. The drainage may gradually turn yellow or pink and become thinner. It is likely that your health care provider will remove the drain when the drainage stops or when the amount decreases to 1-2 Tbsp (15-30 mL) during a 24-hour period. °How to care for your surgical drain °· Keep the skin around the drain dry and covered with a dressing at all times. °· Check your drain area every day for signs of infection. Check for: °? More redness, swelling, or pain. °? Pus or a bad smell. °? Cloudy drainage. °Follow instructions from your health care provider about how to take care of your drain and how to change your dressing. Change your dressing at least one time every day. Change it more often if needed to keep the dressing dry. Make sure you: °1. Gather your supplies, including: °? Tape. °? Germ-free cleaning solution (sterile saline). °? Split gauze drain sponge: 4 x 4 inches (10 x 10 cm). °? Gauze square: 4 x 4 inches  (10 x 10 cm). °2. Wash your hands with soap and water before you change your dressing. If soap and water are not available, use hand sanitizer. °3. Remove the old dressing. Avoid using scissors to do that. °4. Use sterile saline to clean your skin around the drain. °5. Place the tube through the slit in a drain sponge. Place the drain sponge so that it covers your wound. °6. Place the gauze square or another drain sponge on top of the drain sponge that is on the wound. Make sure the tube is between those layers. °7. Tape the dressing to your skin. °8. If you have an active bulb or Hemovac drain, tape the drainage tube to your skin 1-2 inches (2.5-5 cm) below the place where the tube enters your body. Taping keeps the tube from pulling on any stitches (sutures) that you have. °9. Wash your hands with soap and water. °10. Write down the color of your drainage and how often you change your dressing. ° °How to empty your active bulb or Hemovac drain °1. Make sure that you have a measuring cup that you can empty your drainage into. °2. Wash your hands with soap and water. If soap and water are not available, use hand sanitizer. °3. Gently move your fingers down the tube while squeezing very lightly. This is called stripping the tube. This clears any drainage, clots, or tissue from the tube. °? Do not pull on the tube. °? You may need to strip   the tube several times every day to keep the tube clear. 4. Open the bulb cap or the drain plug. Do not touch the inside of the cap or the bottom of the plug. 5. Empty all of the drainage into the measuring cup. 6. Compress the bulb or the container and replace the cap or the plug. To compress the bulb or the container, squeeze it firmly in the middle while you close the cap or plug the container. 7. Write down the amount of drainage that you have in each 24-hour period. If you have less than 2 Tbsp (30 mL) of drainage during 24 hours, contact your health care  provider. 8. Flush the drainage down the toilet. 9. Wash your hands with soap and water. Contact a health care provider if:  You have more redness, swelling, or pain around your drain area.  The amount of drainage that you have is increasing instead of decreasing.  You have pus or a bad smell coming from your drain area.  You have a fever.  You have drainage that is cloudy.  There is a sudden stop or a sudden decrease in the amount of drainage that you have.  Your tube falls out.  Your active draindoes not stay compressedafter you empty it. This information is not intended to replace advice given to you by your health care provider. Make sure you discuss any questions you have with your health care provider. Document Released: 01/31/2000 Document Revised: 07/11/2015 Document Reviewed: 08/22/2014 Elsevier Interactive Patient Education  [employer].   Please arrive at least 30 min before your appointment to complete your check in paperwork.  If you are unable to arrive 30 min prior to your appointment time we may have to cancel or reschedule you.  LAPAROSCOPIC SURGERY: POST OP INSTRUCTIONS  1. DIET: Follow a light bland diet the first 24 hours after arrival home, such as soup, liquids, crackers, etc. Be sure to include lots of fluids daily. Avoid fast food or heavy meals as your are more likely to get nauseated. Eat a low fat the next few days after surgery.  2. Take your usually prescribed home medications unless otherwise directed. 3. PAIN CONTROL:  1. Pain is best controlled by a usual combination of three different methods TOGETHER:  1. Ice/Heat 2. Over the counter pain medication 3. Prescription pain medication 2. Most patients will experience some swelling and bruising around the incisions. Ice packs or heating pads (30-60 minutes up to 6 times a day) will help. Use ice for the first few days to help decrease swelling and bruising, then switch to heat to help relax  tight/sore spots and speed recovery. Some people prefer to use ice alone, heat alone, alternating between ice & heat. Experiment to what works for you. Swelling and bruising can take several weeks to resolve.  3. It is helpful to take an over-the-counter pain medication regularly for the first few weeks. Choose one of the following that works best for you:  1. Naproxen (Aleve, etc) Two 220mg  tabs twice a day 2. Ibuprofen (Advil, etc) Three 200mg  tabs four times a day (every meal & bedtime) 3. Acetaminophen (Tylenol, etc) 500-650mg  four times a day (every meal & bedtime) 4. A prescription for pain medication (such as oxycodone, hydrocodone, etc) should be given to you upon discharge. Take your pain medication as prescribed.  1. If you are having problems/concerns with the prescription medicine (does not control pain, nausea, vomiting, rash, itching, etc), please call us [phone]  [phone] to see if we need to switch you to a different pain medicine that will work better for you and/or control your side effect better. 2. If you need a refill on your pain medication, please contact your pharmacy. They will contact our office to request authorization. Prescriptions will not be filled after 5 pm or on week-ends. 4. Avoid getting constipated. Between the surgery and the pain medications, it is common to experience some constipation. Increasing fluid intake and taking a fiber supplement (such as Metamucil, Citrucel, FiberCon, MiraLax, etc) 1-2 times a day regularly will usually help prevent this problem from occurring. A mild laxative (prune juice, Milk of Magnesia, MiraLax, etc) should be taken according to package directions if there are no bowel movements after 48 hours.  5. Watch out for diarrhea. If you have many loose bowel movements, simplify your diet to bland foods & liquids for a few days. Stop any stool softeners and decrease your fiber supplement. Switching to mild anti-diarrheal medications (Kayopectate,  Pepto Bismol) can help. If this worsens or does not improve, please call us. 6. Wash / shower every day. You may shower over the dressings as they are waterproof. Continue to shower over incision(s) after the dressing is off. If there is glue over the incisions try not to pick it off, let it fall off naturally. 7. Remove your waterproof bandages 2 days after surgery. You may leave the incision open to air. You may replace a dressing/Band-Aid to cover the incision for comfort if you wish.  8. ACTIVITIES as tolerated:  1. You may resume regular (light) daily activities beginning the next day--such as daily self-care, walking, climbing stairs--gradually increasing activities as tolerated. If you can walk 30 minutes without difficulty, it is safe to try more intense activity such as jogging, treadmill, bicycling, low-impact aerobics, swimming, etc. 2. Save the most intensive and strenuous activity for last such as sit-ups, heavy lifting, contact sports, etc Refrain from any heavy lifting or straining until you are off narcotics for pain control. For the first 2-3 weeks do not lift over 10-15lb.  3. DO NOT PUSH THROUGH PAIN. Let pain be your guide: If it hurts to do something, don't do it. Pain is your body warning you to avoid that activity for another week until the pain goes down. 4. You may drive when you are no longer taking prescription pain medication, you can comfortably wear a seatbelt, and you can safely maneuver your car and apply brakes. 5. You may have sexual intercourse when it is comfortable.  9. FOLLOW UP in our office  1. Please call [facility] at [phone] to set up an appointment to see your surgeon in the office for a follow-up appointment approximately 2-3 weeks after your surgery. 2. Make sure that you call for this appointment the day you arrive home to insure a convenient appointment time.      10. IF YOU HAVE DISABILITY OR FAMILY LEAVE FORMS, BRING THEM TO THE               OFFICE FOR  PROCESSING.   WHEN TO CALL us [phone]:  1. Poor pain control 2. Reactions / problems with new medications (rash/itching, nausea, etc)  3. Fever over 101.5 F (38.5 C) 4. Inability to urinate 5. Nausea and/or vomiting 6. Worsening swelling or bruising 7. Continued bleeding from incision. 8. Increased pain, redness, or drainage from the incision  The clinic staff is available to answer your questions during regular business hours (  8:30am-5pm). Please dont hesitate to call and ask to speak to one of our nurses for clinical concerns.  If you have a medical emergency, go to the nearest emergency room or call 911.  A surgeon from [facility] is always on call at the [facility], [place], [place], [place], [place] [address] ?  MAIN: [phone] ? TOLL FREE: [phone] ?  FAX [phone]  [email or web address]

## 2016-08-18 NOTE — Discharge Summary (Signed)
Physician Discharge Summary  [name]. MRN: [id number] DOB/AGE: 10-07-1945 71 y.o.  PCP: Laurey Morale, MD   Admit date: 08/16/2016 Discharge date: 08/18/2016  Discharge Diagnoses:    Principal Problem:   Acute cholecystitis Active Problems:   Hypokalemia   Hyperglycemia    Follow-up recommendations Follow-up with PCP in 3-5 days , including all  additional recommended appointments as below Follow-up CBC, CMP in 3-5 days Patient to follow-up with general surgery within a week for drain check Patient recommended to hold HCTZ for a couple of weeks until oral intake is completely established     Current Discharge Medication List    CONTINUE these medications which have CHANGED   Details  hydrochlorothiazide (HYDRODIURIL) 25 MG tablet Take 1 tablet (25 mg total) by mouth daily. Qty: 90 tablet, Refills: 3      CONTINUE these medications which have NOT CHANGED   Details  acetaminophen (TYLENOL) 500 MG tablet Take 1,000 mg by mouth every 6 (six) hours as needed for mild pain (pain).     amLODipine (NORVASC) 10 MG tablet Take 1 tablet (10 mg total) by mouth daily. Qty: 90 tablet, Refills: 3    aspirin 81 MG tablet Take 81 mg by mouth daily.     b complex vitamins tablet Take 1 tablet by mouth daily.    cetirizine (ZYRTEC) 10 MG tablet Take 10 mg by mouth daily as needed for allergies.    gabapentin (NEURONTIN) 100 MG capsule Take 1 capsule (100 mg total) by mouth 4 (four) times daily. Qty: 360 capsule, Refills: 1    MELATONIN PO Take 1 tablet by mouth at bedtime as needed.    methotrexate 250 MG/10ML injection Inject 15 mg into the muscle once a week.    Multiple Vitamin (MULTIVITAMIN) tablet Take 1 tablet by mouth daily.      ranitidine (ZANTAC) 150 MG tablet Take 150 mg by mouth 2 (two) times daily.    sertraline (ZOLOFT) 100 MG tablet Take 1 tablet (100 mg total) by mouth 2 (two) times daily. Qty: 180 tablet, Refills: 3    tamsulosin (FLOMAX) 0.4 MG  CAPS capsule Take 1 capsule (0.4 mg total) by mouth daily. Qty: 90 capsule, Refills: 3    triamcinolone (NASACORT) 55 MCG/ACT nasal inhaler Place 2 sprays into the nose every evening. Reported on 03/25/2015    Turmeric 500 MG CAPS Take 500 mg by mouth daily.    oxyCODONE (ROXICODONE) 5 MG immediate release tablet Take 1 tablet (5 mg total) by mouth every 6 (six) hours as needed for severe pain. Qty: 12 tablet, Refills: 0    Sodium Hyaluronate, [place], (EUFLEXXA IX) Inject into the articular space. In knee joint done at Dr Rudene Anda office  3 injections 1 week apart last injection is due 04-15-2016 Injections last 6-12 months         Discharge Condition: *Stable   Discharge Instructions Get Medicines reviewed and adjusted: Please take all your medications with you for your next visit with your Primary MD  Please request your Primary MD to go over all hospital tests and procedure/radiological results at the follow up, please ask your Primary MD to get all Hospital records sent to his/her office.  If you experience worsening of your admission symptoms, develop shortness of breath, life threatening emergency, suicidal or homicidal thoughts you must seek medical attention immediately by calling 911 or calling your MD immediately if symptoms less severe.  You must read complete instructions/literature along with all the possible  adverse reactions/side effects for all the Medicines you take and that have been prescribed to you. Take any new Medicines after you have completely understood and accpet all the possible adverse reactions/side effects.   Do not drive when taking Pain medications.   Do not take more than prescribed Pain, Sleep and Anxiety Medications  Special Instructions: If you have smoked or chewed Tobacco in the last 2 yrs please stop smoking, stop any regular Alcohol and or any Recreational drug use.  Wear Seat belts while driving.  Please note  You were cared for  by a hospitalist during your hospital stay. Once you are discharged, your primary care physician will handle any further medical issues. Please note that NO REFILLS for any discharge medications will be authorized once you are discharged, as it is imperative that you return to your primary care physician (or establish a relationship with a primary care physician if you do not have one) for your aftercare needs so that they can reassess your need for medications and monitor your lab values.     Allergies  Allergen Reactions  . Bee Venom Anaphylaxis  . Levofloxacin Itching  . Penicillins Hives    Has patient had a PCN reaction causing immediate rash, facial/tongue/throat swelling, SOB or lightheadedness with hypotension: unknown Has patient had a PCN reaction causing severe rash involving mucus membranes or skin necrosis: unknown Has patient had a PCN reaction that required hospitalization No Has patient had a PCN reaction occurring within the last 10 years: No If all of the above answers are "NO", then may proceed with Cephalosporin use.   . Trazodone And Nefazodone Swelling    Throat swells      Disposition: 01-Home or Self Care   Consults:  General surgery    Significant Diagnostic Studies:  Ct Renal Stone Study  Addendum Date: 08/16/2016   ADDENDUM REPORT: 08/16/2016 14:52 ADDENDUM: These results were called by telephone at the time of interpretation on 08/16/2016 at 2:52 pm to Dr. Dorie Rank , who verbally acknowledged these results. Electronically Signed   By: Fidela Salisbury M.D.   On: 08/16/2016 14:52   Result Date: 08/16/2016 CLINICAL DATA:  Right lower quadrant abdominal pain since last night. Vomiting. EXAM: CT ABDOMEN AND PELVIS WITHOUT CONTRAST TECHNIQUE: Multidetector CT imaging of the abdomen and pelvis was performed following the standard protocol without IV contrast. COMPARISON:  04/20/2016 FINDINGS: Lower chest: Mild chronic interstitial lung changes. Calcific  atherosclerotic disease of the coronary arteries. Hepatobiliary: The liver has normal nonenhanced appearance. The gallbladder wall is distended to 5.1 cm. Gallbladder wall is indistinct. Inflammatory stranding is seen in porta hepaticus. Pancreas: Unremarkable. No pancreatic ductal dilatation or surrounding inflammatory changes. Spleen: Normal in size without focal abnormality. Adrenals/Urinary Tract: Adrenal glands are unremarkable. No evidence of obstructive uropathy. Two nonobstructing few mm calculi are seen within the left kidney. Tiny 1-2 mm nonobstructive calculus in the lower pole of the right kidney. Nonspecific bilateral perirenal fat stranding. Bladder is unremarkable. Stomach/Bowel: Stomach is within normal limits. Inflammatory fat stranding surrounds the versus second portion of the duodenum. Appendix appears normal. No evidence of bowel wall thickening, distention, or inflammatory changes. Scatter left colonic diverticulosis without evidence of diverticulitis. Vascular/Lymphatic: Aortic atherosclerosis. No enlarged abdominal or pelvic lymph nodes. Reproductive: Enlarged heterogeneous prostate. Other: No abdominal wall hernia or abnormality. No abdominopelvic ascites. Musculoskeletal: No significant change of previously described compression fractures of T12, L2 and L4 vertebral bodies. 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 IMPRESSION: Abnormal distension and  indistinctness of the gallbladder wall with inflammatory fat stranding in the liver hilum and surrounding the first and second portion of the duodenum. Findings are suspicious for acute cholecystitis/duodenitis. Please correlate clinically. No evidence of obstructive uropathy. Bilateral nonobstructive nephrolithiasis. Nonspecific perirenal fat stranding. Enlarged prostate gland.  Please correlate to PSA values. Calcific atherosclerotic disease of the coronary arteries. 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  Electronically Signed: By: Fidela Salisbury M.D. On: 08/16/2016 14:49        Filed Weights   08/16/16 1825  Weight: 101.4 kg (223 lb 8.7 oz)     Microbiology: Recent Results (from the past 240 hour(s))  Surgical pcr screen     Status: None   Collection Time: 08/16/16 10:16 PM  Result Value Ref Range Status   MRSA, PCR NEGATIVE NEGATIVE Final   Staphylococcus aureus NEGATIVE NEGATIVE Final    Comment:        The Xpert SA Assay (FDA approved for NASAL specimens in patients over 29 years of age), is one component of a comprehensive surveillance program.  Test performance has been validated by [facility] for patients greater than or equal to 44 year old. It is not intended to diagnose infection nor to guide or monitor treatment.        Blood Culture No results found for: SDES, [place], CULT, REPTSTATUS    Labs: Results for orders placed or performed during the hospital encounter of 08/16/16 (from the past 48 hour(s))  Lipase, blood     Status: None   Collection Time: 08/16/16  1:05 PM  Result Value Ref Range   Lipase 15 11 - 51 U/L  Comprehensive metabolic panel     Status: Abnormal   Collection Time: 08/16/16  1:05 PM  Result Value Ref Range   Sodium 136 135 - 145 mmol/L   Potassium 2.7 (LL) 3.5 - 5.1 mmol/L    Comment: CRITICAL RESULT CALLED TO, READ BACK BY AND VERIFIED WITH: THORNTON,S AT 1350 ON [id number] BY HOOKER,B    Chloride 94 (L) 101 - 111 mmol/L   CO2 29 22 - 32 mmol/L   Glucose, Bld 218 (H) 65 - 99 mg/dL   BUN 21 (H) 6 - 20 mg/dL   Creatinine, Ser 1.20 0.61 - 1.24 mg/dL   Calcium 9.3 8.9 - 10.3 mg/dL   Total Protein 7.7 6.5 - 8.1 g/dL   Albumin 4.4 3.5 - 5.0 g/dL   AST 28 15 - 41 U/L   ALT 20 17 - 63 U/L   Alkaline Phosphatase 77 38 - 126 U/L   Total Bilirubin 1.9 (H) 0.3 - 1.2 mg/dL   GFR calc non Af Amer 60 (L) >60 mL/min   GFR calc Af Amer >60 >60 mL/min    Comment: (NOTE) The eGFR has been calculated using the CKD EPI  equation. This calculation has not been validated in all clinical situations. eGFR's persistently <60 mL/min signify possible Chronic Kidney Disease.    Anion gap 13 5 - 15  CBC     Status: Abnormal   Collection Time: 08/16/16  1:05 PM  Result Value Ref Range   WBC 11.1 (H) 4.0 - 10.5 K/uL   RBC 4.65 4.22 - 5.81 MIL/uL   Hemoglobin 16.0 13.0 - 17.0 g/dL   HCT 43.4 39.0 - 52.0 %   MCV 93.3 78.0 - 100.0 fL   MCH 34.4 (H) 26.0 - 34.0 pg   MCHC 36.9 (H) 30.0 - 36.0 g/dL   RDW 13.6 11.5 -  15.5 %   Platelets 124 (L) 150 - 400 K/uL  Urinalysis, Routine w reflex microscopic     Status: Abnormal   Collection Time: 08/16/16  1:05 PM  Result Value Ref Range   Color, Urine YELLOW YELLOW   APPearance CLEAR CLEAR   Specific Gravity, Urine 1.016 1.005 - 1.030   pH 6.0 5.0 - 8.0   Glucose, UA >=500 (A) NEGATIVE mg/dL   Hgb urine dipstick MODERATE (A) NEGATIVE   Bilirubin Urine NEGATIVE NEGATIVE   Ketones, ur 20 (A) NEGATIVE mg/dL   Protein, ur 30 (A) NEGATIVE mg/dL   Nitrite NEGATIVE NEGATIVE   Leukocytes, UA NEGATIVE NEGATIVE   RBC / HPF 6-30 0 - 5 RBC/hpf   WBC, UA 0-5 0 - 5 WBC/hpf   Bacteria, UA NONE SEEN NONE SEEN   Squamous Epithelial / LPF NONE SEEN NONE SEEN   Mucous PRESENT   Magnesium     Status: Abnormal   Collection Time: 08/16/16  1:05 PM  Result Value Ref Range   Magnesium 1.4 (L) 1.7 - 2.4 mg/dL  Basic metabolic panel     Status: Abnormal   Collection Time: 08/16/16  6:13 PM  Result Value Ref Range   Sodium 138 135 - 145 mmol/L   Potassium 2.7 (LL) 3.5 - 5.1 mmol/L    Comment: CRITICAL RESULT CALLED TO, READ BACK BY AND VERIFIED WITH: SHAW,B RN [id number] COVINGTON,N    Chloride 96 (L) 101 - 111 mmol/L   CO2 29 22 - 32 mmol/L   Glucose, Bld 167 (H) 65 - 99 mg/dL   BUN 19 6 - 20 mg/dL   Creatinine, Ser 1.18 0.61 - 1.24 mg/dL   Calcium 8.7 (L) 8.9 - 10.3 mg/dL   GFR calc non Af Amer >60 >60 mL/min   GFR calc Af Amer >60 >60 mL/min    Comment: (NOTE) The eGFR  has been calculated using the CKD EPI equation. This calculation has not been validated in all clinical situations. eGFR's persistently <60 mL/min signify possible Chronic Kidney Disease.    Anion gap 13 5 - 15  Magnesium     Status: Abnormal   Collection Time: 08/16/16  8:54 PM  Result Value Ref Range   Magnesium 1.5 (L) 1.7 - 2.4 mg/dL  Phosphorus     Status: None   Collection Time: 08/16/16  8:54 PM  Result Value Ref Range   Phosphorus 3.6 2.5 - 4.6 mg/dL  APTT     Status: None   Collection Time: 08/16/16  8:54 PM  Result Value Ref Range   aPTT 33 24 - 36 seconds  Protime-INR     Status: None   Collection Time: 08/16/16  8:54 PM  Result Value Ref Range   Prothrombin Time 15.0 11.4 - 15.2 seconds   INR 1.18   TSH     Status: None   Collection Time: 08/16/16  8:54 PM  Result Value Ref Range   TSH 0.667 0.350 - 4.500 uIU/mL    Comment: Performed by a 3rd Generation assay with a functional sensitivity of <=0.01 uIU/mL.  Hemoglobin A1c     Status: Abnormal   Collection Time: 08/16/16  8:54 PM  Result Value Ref Range   Hgb A1c MFr Bld 6.0 (H) 4.8 - 5.6 %    Comment: (NOTE)         Pre-diabetes: 5.7 - 6.4         Diabetes: >6.4         Glycemic control  for adults with diabetes: <7.0    Mean Plasma Glucose 126 mg/dL    Comment: (NOTE) Performed At: [facility] [place], [place] [id number] Lindon Romp MD [place]:[id number]   Surgical pcr screen     Status: None   Collection Time: 08/16/16 10:16 PM  Result Value Ref Range   MRSA, PCR NEGATIVE NEGATIVE   Staphylococcus aureus NEGATIVE NEGATIVE    Comment:        The Xpert SA Assay (FDA approved for NASAL specimens in patients over 28 years of age), is one component of a comprehensive surveillance program.  Test performance has been validated by [facility] for patients greater than or equal to 5 year old. It is not intended to diagnose infection nor to guide or monitor treatment.   Glucose,  capillary     Status: Abnormal   Collection Time: 08/16/16 10:17 PM  Result Value Ref Range   Glucose-Capillary 153 (H) 65 - 99 mg/dL  Comprehensive metabolic panel     Status: Abnormal   Collection Time: 08/17/16  4:49 AM  Result Value Ref Range   Sodium 138 135 - 145 mmol/L   Potassium 3.0 (L) 3.5 - 5.1 mmol/L   Chloride 99 (L) 101 - 111 mmol/L   CO2 28 22 - 32 mmol/L   Glucose, Bld 136 (H) 65 - 99 mg/dL   BUN 26 (H) 6 - 20 mg/dL   Creatinine, Ser 1.19 0.61 - 1.24 mg/dL   Calcium 8.4 (L) 8.9 - 10.3 mg/dL   Total Protein 6.1 (L) 6.5 - 8.1 g/dL   Albumin 3.4 (L) 3.5 - 5.0 g/dL   AST 34 15 - 41 U/L   ALT 26 17 - 63 U/L   Alkaline Phosphatase 59 38 - 126 U/L   Total Bilirubin 1.3 (H) 0.3 - 1.2 mg/dL   GFR calc non Af Amer >60 >60 mL/min   GFR calc Af Amer >60 >60 mL/min    Comment: (NOTE) The eGFR has been calculated using the CKD EPI equation. This calculation has not been validated in all clinical situations. eGFR's persistently <60 mL/min signify possible Chronic Kidney Disease.    Anion gap 11 5 - 15  CBC     Status: Abnormal   Collection Time: 08/17/16  4:49 AM  Result Value Ref Range   WBC 11.4 (H) 4.0 - 10.5 K/uL   RBC 4.07 (L) 4.22 - 5.81 MIL/uL   Hemoglobin 13.2 13.0 - 17.0 g/dL   HCT 38.1 (L) 39.0 - 52.0 %   MCV 93.6 78.0 - 100.0 fL   MCH 32.4 26.0 - 34.0 pg   MCHC 34.6 30.0 - 36.0 g/dL   RDW 13.8 11.5 - 15.5 %   Platelets 120 (L) 150 - 400 K/uL  Magnesium     Status: None   Collection Time: 08/17/16  4:49 AM  Result Value Ref Range   Magnesium 2.1 1.7 - 2.4 mg/dL  Glucose, capillary     Status: Abnormal   Collection Time: 08/17/16  8:19 AM  Result Value Ref Range   Glucose-Capillary 116 (H) 65 - 99 mg/dL  Glucose, capillary     Status: Abnormal   Collection Time: 08/17/16 11:40 AM  Result Value Ref Range   Glucose-Capillary 124 (H) 65 - 99 mg/dL  Glucose, capillary     Status: Abnormal   Collection Time: 08/17/16  6:32 PM  Result Value Ref Range    Glucose-Capillary 141 (H) 65 - 99 mg/dL  Glucose, capillary  Status: Abnormal   Collection Time: 08/17/16 11:22 PM  Result Value Ref Range   Glucose-Capillary 200 (H) 65 - 99 mg/dL  Comprehensive metabolic panel     Status: Abnormal   Collection Time: 08/18/16  5:19 AM  Result Value Ref Range   Sodium 137 135 - 145 mmol/L   Potassium 4.2 3.5 - 5.1 mmol/L    Comment: DELTA CHECK NOTED NO VISIBLE HEMOLYSIS REPEATED TO VERIFY    Chloride 101 101 - 111 mmol/L   CO2 29 22 - 32 mmol/L   Glucose, Bld 154 (H) 65 - 99 mg/dL   BUN 25 (H) 6 - 20 mg/dL   Creatinine, Ser 1.16 0.61 - 1.24 mg/dL   Calcium 8.3 (L) 8.9 - 10.3 mg/dL   Total Protein 6.2 (L) 6.5 - 8.1 g/dL   Albumin 3.1 (L) 3.5 - 5.0 g/dL   AST 45 (H) 15 - 41 U/L   ALT 36 17 - 63 U/L   Alkaline Phosphatase 62 38 - 126 U/L   Total Bilirubin 0.8 0.3 - 1.2 mg/dL   GFR calc non Af Amer >60 >60 mL/min   GFR calc Af Amer >60 >60 mL/min    Comment: (NOTE) The eGFR has been calculated using the CKD EPI equation. This calculation has not been validated in all clinical situations. eGFR's persistently <60 mL/min signify possible Chronic Kidney Disease.    Anion gap 7 5 - 15  Glucose, capillary     Status: Abnormal   Collection Time: 08/18/16  8:05 AM  Result Value Ref Range   Glucose-Capillary 154 (H) 65 - 99 mg/dL     Lipid Panel     Component Value Date/Time   CHOL 167 02/19/2016 1243   TRIG 61.0 02/19/2016 1243   HDL 60.50 02/19/2016 1243   CHOLHDL 3 02/19/2016 1243   VLDL 12.2 02/19/2016 1243   LDLCALC 95 02/19/2016 1243     Lab Results  Component Value Date   HGBA1C 6.0 (H) 08/16/2016        HPI :*   71 y.o.malewith medical history significant for but not limited to history of renal calculi presenting with 2 day history of moderate to severe aching right upper quadrant abdominal pain associated with bilious vomiting, nausea, chills without fever. Patient admitted for acute cholecystitis. Schedule for  laparoscopic cholecystectomy with IOC 7/2  Assessment and plan #1 acute cholecystitis: Status post laparoscopic cholecystectomy Diet advanced postoperatively, tolerated well, passing flatus Patient has right upper quadrant drain in place, patient discharged from a surgical standpoint Drain check in 1 week as per [place] surgery  Plan is to discharge home after being seen by  ccs MD    #2 severe hypokalemia: Repleted aggressively, magnesium repleted as well Hold HCTZ for 2 weeks until PO intake is established  #3 anxiety and depression-continue Zoloft  #4 gastroesophageal reflux disease-stable  #5 psoriasis , on methotrexate at home  #6 sleep apnea, on Cpap  #7 Hypertension-hold HCTZ due to hypokalemia, continue Norvasc       Discharge Exam:  Blood pressure 131/83, pulse 78, temperature 97.9 F (36.6 C), temperature source Oral, resp. rate 16, height '6\' 3"'  (1.905 m), weight 101.4 kg (223 lb 8.7 oz), SpO2 95 %.  Cardiovascular system: S1 & S2 heard, RRR. No JVD, murmurs, rubs, gallops or clicks. No pedal edema. Gastrointestinal system:  right upper quadrant with serosanguineous drain. No organomegaly or masses felt. Normal bowel sounds heard. Central nervous system: Alert and oriented. No focal neurological deficits. Extremities:  Symmetric 5 x 5 power. Skin: No rashes, lesions or ulcers Psychiatry: Judgement and insight appear normal. Mood & affect appropriate    Follow-up Information    [facility] Go on 08/21/2016.   Specialty:  General Surgery Why:  Your appointment is 08/21/16 at 2:00PM with one of our nurses to have your drain checked. Please arrive 30 minutes prior to your appointment to check in and fill out necessary paperwork. Contact information: [address] [place] [phone]       Jovita Kussmaul, MD. Daphane Shepherd on 09/08/2016.   Specialty:  General Surgery Why:  Your appointment is  09/08/2016 at 10:40AM. Please arrive 20 minutes prior to your appointment to check in. Contact information: [address] [place] [address] [phone]        Laurey Morale, MD. Call.   Specialty:  Family Medicine Why:  Hospital follow-up in 3-5 days Contact information: [place] [address] [phone]           Signed: Reyne Dumas 08/18/2016, 9:26 AM        Time spent >1 hour

## 2016-08-18 NOTE — Progress Notes (Signed)
Pt  Discharged home, instruction reviewed with pt and wife, pt acknowledged understanding and questions answered and discharge supplies given to pt and wife pertaining to [place] care. SRP, RN

## 2016-08-27 ENCOUNTER — Ambulatory Visit (INDEPENDENT_AMBULATORY_CARE_PROVIDER_SITE_OTHER): Payer: PPO | Admitting: Family Medicine

## 2016-08-27 ENCOUNTER — Encounter: Payer: Self-pay | Admitting: Family Medicine

## 2016-08-27 VITALS — BP 120/80 | HR 71 | Temp 98.2°F | Ht 75.0 in | Wt 211.0 lb

## 2016-08-27 DIAGNOSIS — R739 Hyperglycemia, unspecified: Secondary | ICD-10-CM | POA: Diagnosis not present

## 2016-08-27 DIAGNOSIS — I10 Essential (primary) hypertension: Secondary | ICD-10-CM | POA: Diagnosis not present

## 2016-08-27 DIAGNOSIS — K810 Acute cholecystitis: Secondary | ICD-10-CM

## 2016-08-27 NOTE — Patient Instructions (Signed)
WE NOW OFFER   [facility] FAST TRACK!!!  SAME DAY Appointments for ACUTE CARE  Such as: Sprains, Injuries, cuts, abrasions, rashes, muscle pain, joint pain, back pain Colds, flu, sore throats, headache, allergies, cough, fever  Ear pain, sinus and eye infections Abdominal pain, nausea, vomiting, diarrhea, upset stomach Animal/insect bites  3 Easy Ways to Schedule: Walk-In Scheduling Call in scheduling Mychart Sign-up: [email or web address]         

## 2016-08-27 NOTE — Progress Notes (Signed)
   Subjective:    Patient ID: Brandon Schroeder., male    DOB: 21-Oct-1945, 71 y.o.   MRN: [id number]  HPI Here to follow up a hospital stay from 08-16-16 to 08-18-16 for cholecystitis.he had a lapraroscopic cholecystectomy which went well. His drain was removed and now he feels fine. No nausea or abdominal pain or fever. His diet is almost back to normal and his BMs are regular. However during the stay he had some random glucoses return that were over 200. These quickly settle d back down after the surgery. An A1c during this time was 6.0, putting him in the prediabetic range. His BP was stable. He was told to hold his HCTZ and to follow up with [place]. He has had some borderline fasting glucoses over the past few years. His mother and uncle were diabetics. We reviewed all notes and test results together today.    Review of Systems  Constitutional: Negative.   Respiratory: Negative.   Cardiovascular: Negative.   Gastrointestinal: Negative.   Endocrine: Negative.        Objective:   Physical Exam  Constitutional: He is oriented to person, place, and time. He appears well-developed and well-nourished.  Cardiovascular: Normal rate, regular rhythm, normal heart sounds and intact distal pulses.   Pulmonary/Chest: Effort normal and breath sounds normal.  Abdominal: Soft. Bowel sounds are normal. He exhibits no distension. There is no tenderness. There is no rebound and no guarding.  He has slight swelling over the drain site but it looks clean   Musculoskeletal: He exhibits no edema.  Neurological: He is alert and oriented to person, place, and time.          Assessment & Plan:  He is recovering well from a cholecystectomy. He will follow up with Surgery later today. His HTN is stable so we will stop the HCTZ. He has prediabetes, and we spoke at length about limiting his carbohydrate intake and about exercise. We will refer him to Nutrition to learn more about this. We plan to repeat an A1c in  90 days. Alysia Penna, MD

## 2016-08-29 ENCOUNTER — Other Ambulatory Visit: Payer: Self-pay | Admitting: Family Medicine

## 2016-08-31 NOTE — Telephone Encounter (Signed)
Call in #[phone] with 5 rf 

## 2016-09-18 ENCOUNTER — Other Ambulatory Visit: Payer: Self-pay | Admitting: Family Medicine

## 2016-09-22 ENCOUNTER — Encounter: Payer: PPO | Attending: Family Medicine | Admitting: Registered"

## 2016-09-22 ENCOUNTER — Encounter: Payer: Self-pay | Admitting: Registered"

## 2016-09-22 DIAGNOSIS — R739 Hyperglycemia, unspecified: Secondary | ICD-10-CM | POA: Diagnosis not present

## 2016-09-22 DIAGNOSIS — Z713 Dietary counseling and surveillance: Secondary | ICD-10-CM | POA: Insufficient documentation

## 2016-09-22 DIAGNOSIS — R7303 Prediabetes: Secondary | ICD-10-CM

## 2016-09-22 NOTE — Progress Notes (Signed)
Medical Nutrition Therapy:  Appt start time: 0160 end time:  1505.  Assessment:  Primary concerns today:  Pt states he is here to learn about pre-diabetes and general healthy eating tips.This patient is accompanied in the office by his spouse and grandson.  Pt states his appetite has been reduced since gallbladder surgery but states his doctor said that was to be expected. Pt states he also has less energy and affects how much exercise he gets. Pt states he has started eating smaller meals and more frequently.  Pt states he gets 6-7 hrs sleep using cpap.  Preferred Learning Style:   No preference indicated   Learning Readiness:   Contemplating  MEDICATIONS: reviewed   DIETARY INTAKE:  Usual eating pattern includes 3 meals and 2 snacks per day.  Avoided foods include hamburger and other high fat meals since cholecystectomy.    24-hr recall:  B ( AM): bagel cream cheese OR 2 eggs, sausage, water or sweet tea  Snk ( AM): watermelon OR cookie L ( PM): sandwich roast beef, ham salad OR pasta & chicken, seafood linguini OR [employer]flower popcorn shrimp, coleslaw, potato OR arbys Snk ( PM): nuts OR yogurt w/ nutella OR ice cream D ( PM): TV dinner - chicken pot pie OR sandwich OR grill meat, corn on the cob, green beans, pork & beans OR [place] burgers Snk ( PM): nuts OR fruit OR dessert Beverages: water, sweet tea, Gatorade, Pineapple, Ocean Spray or Welches  Usual physical activity: yard work. Tried to start walking - energy level down.  Estimated energy needs: 2100 calories 235 g carbohydrates 158 g protein 58 g fat  Progress Towards Goal(s):  In progress.   Nutritional Diagnosis:  NI-5.8.4 Inconsistent carbohydrate intake As related to sweet beverages and snacks.  As evidenced by dietary recall and elevated A1c.    Intervention:  Nutrition education for managing blood glucose with diet and lifestyle changes. Described diabetes. Stated some common risk factors for diabetes.   Defined the role of glucose and insulin.  Identified type of diabetes and pathophysiology.  Described the role of different macronutrients on glucose.  Explained how carbohydrates affect blood glucose.  Stated what foods contain the most carbohydrates.  Demonstrated carbohydrate counting.  Demonstrated how to read Nutrition Facts food label.  Plan: Consider reading nutrition facts label check serving size and total carbs Aim for 45-60 g carbs per meal and balance with protein Aim to get 4-5 servings vegetables per day Continue eating nuts Limit the sweetened beverages Increase physical activity as tolerated  Teaching Method Utilized:  Visual Auditory  Handouts given during visit include:  Living Well with Diabetes (copies)  Carb Counting and Food Label handouts  Meal Plan Card  A1c handout  Portion Plate  Barriers to learning/adherence to lifestyle change: none  Demonstrated degree of understanding via:  Teach Back   Monitoring/Evaluation:  Dietary intake, exercise, and body weight prn.

## 2016-09-22 NOTE — Patient Instructions (Signed)
Consider reading nutrition facts label check serving size and total carbs Aim for 45-60 g carbs per meal and balance with protein Aim to get 4-5 servings vegetables per day Continue eating nuts Limit the sweetened beverage Increase physical activity as tolerated

## 2016-10-06 ENCOUNTER — Ambulatory Visit (INDEPENDENT_AMBULATORY_CARE_PROVIDER_SITE_OTHER): Payer: PPO | Admitting: Orthopedic Surgery

## 2016-10-06 ENCOUNTER — Encounter (INDEPENDENT_AMBULATORY_CARE_PROVIDER_SITE_OTHER): Payer: Self-pay | Admitting: Orthopedic Surgery

## 2016-10-06 ENCOUNTER — Ambulatory Visit (INDEPENDENT_AMBULATORY_CARE_PROVIDER_SITE_OTHER): Payer: PPO

## 2016-10-06 VITALS — BP 147/97 | HR 77 | Resp 14 | Ht 75.0 in | Wt 224.0 lb

## 2016-10-06 DIAGNOSIS — M11261 Other chondrocalcinosis, right knee: Secondary | ICD-10-CM

## 2016-10-06 DIAGNOSIS — M25561 Pain in right knee: Secondary | ICD-10-CM | POA: Diagnosis not present

## 2016-10-06 DIAGNOSIS — M1711 Unilateral primary osteoarthritis, right knee: Secondary | ICD-10-CM | POA: Diagnosis not present

## 2016-10-06 DIAGNOSIS — Z872 Personal history of diseases of the skin and subcutaneous tissue: Secondary | ICD-10-CM | POA: Diagnosis not present

## 2016-10-06 MED ORDER — METHYLPREDNISOLONE ACETATE 40 MG/ML IJ SUSP
80.0000 mg | INTRAMUSCULAR | Status: AC | PRN
  Administered 2016-10-06: 80 mg

## 2016-10-06 MED ORDER — LIDOCAINE HCL 1 % IJ SOLN
3.0000 mL | INTRAMUSCULAR | Status: AC | PRN
  Administered 2016-10-06: 3 mL

## 2016-10-06 MED ORDER — BUPIVACAINE HCL 0.5 % IJ SOLN
3.0000 mL | INTRAMUSCULAR | Status: AC | PRN
  Administered 2016-10-06: 3 mL via INTRA_ARTICULAR
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
## 2016-10-07 LAB — SYNOVIAL CELL COUNT + DIFF, W/ CRYSTALS
Basophils, %: 0 %
Eosinophils-Synovial: 0 % (ref 0–2)
LYMPHOCYTES-SYNOVIAL FLD: 21 % (ref 0–74)
MONOCYTE/MACROPHAGE: 69 % (ref 0–69)
NEUTROPHIL, SYNOVIAL: 4 % (ref 0–24)
Synoviocytes, %: 6 % (ref 0–15)
WBC, SYNOVIAL: 152 {cells}/uL — AB (ref <150)

## 2016-10-11 LAB — BODY FLUID CULTURE
GRAM STAIN: NONE SEEN
Gram Stain: NONE SEEN
Organism ID, Bacteria: NO GROWTH

## 2016-10-21 ENCOUNTER — Telehealth: Payer: Self-pay | Admitting: Family Medicine

## 2016-10-21 NOTE — Telephone Encounter (Signed)
Pt states [facility] has changed manufacturers on his zolpidem (AMBIEN) 10 MG tablet  Pt states this new manufacturers just not working. He wants to transfer his Rx to   [facility].  However, it is too soon to refill.  Pt has about 20 pills left, and is requesting an early refill sent to [facility]

## 2016-10-22 MED ORDER — ZOLPIDEM TARTRATE 10 MG PO TABS: 10.0000 mg | ORAL_TABLET | Freq: Every evening | ORAL | 5 refills | Status: AC | PRN

## 2016-10-22 NOTE — Telephone Encounter (Signed)
Call in #30 with 5 rf to the new pharmacy, early refill is okay

## 2016-10-22 NOTE — Telephone Encounter (Signed)
I called in script, per pt request [place] location and spoke with pt.

## 2016-10-22 NOTE — Telephone Encounter (Signed)
I left a voice message for pt to return my call, I cannot find [facility] listed in [place]?

## 2016-10-28 DIAGNOSIS — M1120 Other chondrocalcinosis, unspecified site: Secondary | ICD-10-CM | POA: Diagnosis not present

## 2016-10-28 DIAGNOSIS — Z6828 Body mass index (BMI) 28.0-28.9, adult: Secondary | ICD-10-CM | POA: Diagnosis not present

## 2016-10-28 DIAGNOSIS — E663 Overweight: Secondary | ICD-10-CM | POA: Diagnosis not present

## 2016-10-28 DIAGNOSIS — M2550 Pain in unspecified joint: Secondary | ICD-10-CM | POA: Diagnosis not present

## 2016-10-28 DIAGNOSIS — L400 Psoriasis vulgaris: Secondary | ICD-10-CM | POA: Diagnosis not present

## 2016-11-05 ENCOUNTER — Encounter: Payer: Self-pay | Admitting: Family Medicine

## 2016-11-09 ENCOUNTER — Encounter: Payer: Self-pay | Admitting: Family Medicine

## 2016-11-09 ENCOUNTER — Ambulatory Visit (INDEPENDENT_AMBULATORY_CARE_PROVIDER_SITE_OTHER): Payer: PPO | Admitting: Family Medicine

## 2016-11-09 VITALS — BP 120/73 | HR 88 | Temp 98.5°F | Ht 75.0 in | Wt 219.0 lb

## 2016-11-09 DIAGNOSIS — M940 Chondrocostal junction syndrome [Tietze]: Secondary | ICD-10-CM | POA: Diagnosis not present

## 2016-11-09 MED ORDER — METHYLPREDNISOLONE 4 MG PO TBPK: ORAL_TABLET | ORAL | 0 refills | Status: AC

## 2016-11-09 NOTE — Patient Instructions (Signed)
WE NOW OFFER   [facility] FAST TRACK!!!  SAME DAY Appointments for ACUTE CARE  Such as: Sprains, Injuries, cuts, abrasions, rashes, muscle pain, joint pain, back pain Colds, flu, sore throats, headache, allergies, cough, fever  Ear pain, sinus and eye infections Abdominal pain, nausea, vomiting, diarrhea, upset stomach Animal/insect bites  3 Easy Ways to Schedule: Walk-In Scheduling Call in scheduling Mychart Sign-up: [email or web address]         

## 2016-11-09 NOTE — Progress Notes (Signed)
   Subjective:    Patient ID: Brandon Schroeder., male    DOB: 06-Mar-1945, 71 y.o.   MRN: [id number]  HPI Here for 3 weeks of constant dull pains across the chest which are centered over the lower sternum. I thurts to take a deep breath. This makes him feel slightly SOB. This is not related to exertion. No nausea or sweats.    Review of Systems  Constitutional: Negative.   Respiratory: Positive for shortness of breath. Negative for cough and wheezing.   Cardiovascular: Positive for chest pain. Negative for palpitations and leg swelling.  Gastrointestinal: Negative.   Neurological: Negative.        Objective:   Physical Exam  Constitutional: He appears well-developed and well-nourished. No distress.  Cardiovascular: Normal rate, regular rhythm, normal heart sounds and intact distal pulses.   Pulmonary/Chest: Effort normal and breath sounds normal. No respiratory distress. He has no wheezes. He has no rales.  He is tender over both lower sternal margins, left worse then right.           Assessment & Plan:  Costochondritis, treat with a Medrol dose pack. He wife says he has been digging up roots in the yard with a pickaxe, so I asked him to stop this and take it easy for a few weeks.  Alysia Penna, MD

## 2016-11-19 DIAGNOSIS — K810 Acute cholecystitis: Secondary | ICD-10-CM | POA: Diagnosis not present

## 2016-11-20 ENCOUNTER — Encounter: Payer: Self-pay | Admitting: Family Medicine

## 2016-11-20 ENCOUNTER — Ambulatory Visit (HOSPITAL_COMMUNITY)
Admission: RE | Admit: 2016-11-20 | Discharge: 2016-11-20 | Disposition: A | Discharge status: Home / Self Care | Payer: PPO | Source: Ambulatory Visit | Attending: Family Medicine | Admitting: Family Medicine

## 2016-11-20 ENCOUNTER — Ambulatory Visit (INDEPENDENT_AMBULATORY_CARE_PROVIDER_SITE_OTHER): Payer: PPO | Admitting: Family Medicine

## 2016-11-20 ENCOUNTER — Telehealth: Payer: Self-pay | Admitting: Internal Medicine

## 2016-11-20 VITALS — BP 138/84 | HR 87 | Temp 98.9°F | Ht 75.0 in | Wt 211.0 lb

## 2016-11-20 DIAGNOSIS — R918 Other nonspecific abnormal finding of lung field: Secondary | ICD-10-CM | POA: Diagnosis not present

## 2016-11-20 DIAGNOSIS — R0602 Shortness of breath: Secondary | ICD-10-CM | POA: Diagnosis not present

## 2016-11-20 DIAGNOSIS — J181 Lobar pneumonia, unspecified organism: Secondary | ICD-10-CM | POA: Diagnosis not present

## 2016-11-20 DIAGNOSIS — R079 Chest pain, unspecified: Secondary | ICD-10-CM | POA: Insufficient documentation

## 2016-11-20 DIAGNOSIS — R739 Hyperglycemia, unspecified: Secondary | ICD-10-CM | POA: Diagnosis not present

## 2016-11-20 DIAGNOSIS — I10 Essential (primary) hypertension: Secondary | ICD-10-CM | POA: Diagnosis not present

## 2016-11-20 DIAGNOSIS — R197 Diarrhea, unspecified: Secondary | ICD-10-CM

## 2016-11-20 DIAGNOSIS — J189 Pneumonia, unspecified organism: Secondary | ICD-10-CM

## 2016-11-20 LAB — CBC WITH DIFFERENTIAL/PLATELET
BASOS PCT: 0.4 % (ref 0.0–3.0)
Basophils Absolute: 0 10*3/uL (ref 0.0–0.1)
EOS PCT: 5.1 % — AB (ref 0.0–5.0)
Eosinophils Absolute: 0.4 10*3/uL (ref 0.0–0.7)
HEMATOCRIT: 39.4 % (ref 39.0–52.0)
Hemoglobin: 13.3 g/dL (ref 13.0–17.0)
LYMPHS ABS: 0.8 10*3/uL (ref 0.7–4.0)
Lymphocytes Relative: 11.4 % — ABNORMAL LOW (ref 12.0–46.0)
MCHC: 33.8 g/dL (ref 30.0–36.0)
MCV: 96.4 fl (ref 78.0–100.0)
MONO ABS: 0.6 10*3/uL (ref 0.1–1.0)
Monocytes Relative: 8.8 % (ref 3.0–12.0)
Neutro Abs: 5.3 10*3/uL (ref 1.4–7.7)
Neutrophils Relative %: 74.3 % (ref 43.0–77.0)
PLATELETS: 194 10*3/uL (ref 150.0–400.0)
RBC: 4.09 Mil/uL — ABNORMAL LOW (ref 4.22–5.81)
RDW: 14 % (ref 11.5–15.5)
WBC: 7.1 10*3/uL (ref 4.0–10.5)

## 2016-11-20 LAB — LIPID PANEL
CHOL/HDL RATIO: 3
CHOLESTEROL: 123 mg/dL (ref 0–200)
HDL: 44.3 mg/dL (ref >39.00)
LDL CALC: 65 mg/dL (ref 0–99)
NonHDL: 78.58
Triglycerides: 70 mg/dL (ref 0.0–149.0)
VLDL: 14 mg/dL (ref 0.0–40.0)

## 2016-11-20 LAB — HEPATIC FUNCTION PANEL
ALK PHOS: 99 U/L (ref 39–117)
ALT: 10 U/L (ref 0–53)
AST: 12 U/L (ref 0–37)
Albumin: 3.6 g/dL (ref 3.5–5.2)
BILIRUBIN TOTAL: 0.7 mg/dL (ref 0.2–1.2)
Bilirubin, Direct: 0.2 mg/dL (ref 0.0–0.3)
Total Protein: 6.1 g/dL (ref 6.0–8.3)

## 2016-11-20 LAB — HEMOGLOBIN A1C: Hgb A1c MFr Bld: 6.3 % (ref 4.6–6.5)

## 2016-11-20 LAB — BASIC METABOLIC PANEL
BUN: 12 mg/dL (ref 6–23)
CO2: 31 mEq/L (ref 19–32)
Calcium: 9.2 mg/dL (ref 8.4–10.5)
Chloride: 99 mEq/L (ref 96–112)
Creatinine, Ser: 1.11 mg/dL (ref 0.40–1.50)
GFR: 69.61 mL/min (ref >60.00)
Glucose, Bld: 115 mg/dL — ABNORMAL HIGH (ref 70–99)
POTASSIUM: 3.8 meq/L (ref 3.5–5.1)
SODIUM: 140 meq/L (ref 135–145)

## 2016-11-20 MED ORDER — OMEPRAZOLE 20 MG PO CPDR: 20.0000 mg | DELAYED_RELEASE_CAPSULE | Freq: Every day | ORAL | 3 refills | Status: AC

## 2016-11-20 MED ORDER — CEFUROXIME AXETIL 500 MG PO TABS: 500.0000 mg | ORAL_TABLET | Freq: Two times a day (BID) | ORAL | 0 refills | Status: AC

## 2016-11-20 NOTE — Addendum Note (Signed)
Addended by: Alysia Penna A on: 11/20/2016 01:58 PM   Modules accepted: Orders

## 2016-11-20 NOTE — Progress Notes (Signed)
   Subjective:    Patient ID: Brandon Schroeder., male    DOB: 1945-08-19, 71 y.o.   MRN: [id number]  HPI Here to follow up chest pains and for recent diarrhea. We saw him a week ago and felt he had musculoskeletal chest wall pain and we gave him a steroid dose pack. This did not help him at all however. He still has dull achy pains in the chest and some mild SOB. He also developed watery diarrhea 3 days ago. No fever, no cramps, no nausea or vomiting. He took some Imodium yesterday and the diarrhea stopped. He has not had a BM today. He now tells me that the chest pain started at least 6 months ago but was very subtle at first. It has only recently become more of an issue. I reviewed the abdominal CT obtained last July when his cholecystitis was diagnosed. Otherwise this was unremarkable. He is taking Omeprazole 20 mg daily. He was due for a colonoscopy last February. He saw Surgery yesterday and they requested we get some labs and a CXR.    Review of Systems  Constitutional: Negative.   Respiratory: Positive for cough, chest tightness and shortness of breath. Negative for wheezing.   Cardiovascular: Positive for chest pain. Negative for palpitations and leg swelling.  Gastrointestinal: Positive for diarrhea. Negative for abdominal distention, abdominal pain, anal bleeding, blood in stool, constipation, nausea, rectal pain and vomiting.  Genitourinary: Negative.        Objective:   Physical Exam  Constitutional: He appears well-developed and well-nourished. No distress.  Neck: No thyromegaly present.  Cardiovascular: Normal rate, regular rhythm, normal heart sounds and intact distal pulses.   Pulmonary/Chest: Effort normal and breath sounds normal. No respiratory distress. He has no wheezes. He has no rales. He exhibits no tenderness.  Abdominal: Soft. Bowel sounds are normal. He exhibits no distension and no mass. There is no rebound and no guarding.  Mild tenderness across the lower  abdomen  Lymphadenopathy:    He has no cervical adenopathy.          Assessment & Plan:  He has chronic chest pain which now seems to be of GI origin to me, possibly esophageal. He is taking OTC Omeprazole. He has some recent diarrhea, and I am not sure if this is related to the chest pains or not. We will get labs including a C diff, as well as a CXR. Refer to GI. Since he needs a colonoscopy, I would think they will want to do an upper endoscopy as well given his symptoms.  Alysia Penna, MD

## 2016-11-20 NOTE — Telephone Encounter (Signed)
Pt scheduled to see Nicoletta Ba PA 11/24/16@1 :30pm, please let pt know.

## 2016-11-20 NOTE — Telephone Encounter (Signed)
Pt is aware of appointment 

## 2016-11-20 NOTE — Patient Instructions (Signed)
WE NOW OFFER   [facility] FAST TRACK!!!  SAME DAY Appointments for ACUTE CARE  Such as: Sprains, Injuries, cuts, abrasions, rashes, muscle pain, joint pain, back pain Colds, flu, sore throats, headache, allergies, cough, fever  Ear pain, sinus and eye infections Abdominal pain, nausea, vomiting, diarrhea, upset stomach Animal/insect bites  3 Easy Ways to Schedule: Walk-In Scheduling Call in scheduling Mychart Sign-up: [email or web address]         

## 2016-11-23 ENCOUNTER — Telehealth: Payer: Self-pay | Admitting: Adult Health

## 2016-11-23 NOTE — Telephone Encounter (Signed)
Left message for patient to call back. It appears that Dr. Barbie Banner office ordered the CT scan. Will advise him to contact Dr. Gar Gibbon office for information on scan.

## 2016-11-24 ENCOUNTER — Ambulatory Visit: Payer: PPO | Admitting: Physician Assistant

## 2016-11-24 NOTE — Telephone Encounter (Signed)
ATC patient. Someone answered but did not say anything. Will call back later.

## 2016-11-24 NOTE — Telephone Encounter (Signed)
Patient returned call.  Advised him of notes that he is to call Dr Barbie Banner office for information on scan and he will call us back if he would like to schedule an appt.  CB is [phone].  No call back is needed at this time.

## 2016-11-25 ENCOUNTER — Encounter (HOSPITAL_BASED_OUTPATIENT_CLINIC_OR_DEPARTMENT_OTHER): Payer: Self-pay

## 2016-11-25 ENCOUNTER — Inpatient Hospital Stay (HOSPITAL_COMMUNITY)
Admission: EM | Admit: 2016-11-25 | Discharge: 2016-12-17 | DRG: 193 | Disposition: E | Payer: PPO | Attending: Internal Medicine | Admitting: Internal Medicine

## 2016-11-25 ENCOUNTER — Ambulatory Visit (HOSPITAL_BASED_OUTPATIENT_CLINIC_OR_DEPARTMENT_OTHER)
Admission: RE | Admit: 2016-11-25 | Discharge: 2016-11-25 | Disposition: A | Discharge status: Home / Self Care | Payer: PPO | Source: Ambulatory Visit | Attending: Family Medicine | Admitting: Family Medicine

## 2016-11-25 ENCOUNTER — Encounter (HOSPITAL_COMMUNITY): Payer: Self-pay | Admitting: Emergency Medicine

## 2016-11-25 ENCOUNTER — Other Ambulatory Visit: Payer: Self-pay

## 2016-11-25 DIAGNOSIS — Z7189 Other specified counseling: Secondary | ICD-10-CM | POA: Diagnosis not present

## 2016-11-25 DIAGNOSIS — F418 Other specified anxiety disorders: Secondary | ICD-10-CM | POA: Diagnosis not present

## 2016-11-25 DIAGNOSIS — I700 Atherosclerosis of aorta: Secondary | ICD-10-CM | POA: Insufficient documentation

## 2016-11-25 DIAGNOSIS — G4700 Insomnia, unspecified: Secondary | ICD-10-CM | POA: Diagnosis present

## 2016-11-25 DIAGNOSIS — Z77120 Contact with and (suspected) exposure to mold (toxic): Secondary | ICD-10-CM

## 2016-11-25 DIAGNOSIS — Z885 Allergy status to narcotic agent status: Secondary | ICD-10-CM

## 2016-11-25 DIAGNOSIS — K219 Gastro-esophageal reflux disease without esophagitis: Secondary | ICD-10-CM | POA: Diagnosis not present

## 2016-11-25 DIAGNOSIS — G629 Polyneuropathy, unspecified: Secondary | ICD-10-CM | POA: Diagnosis not present

## 2016-11-25 DIAGNOSIS — J90 Pleural effusion, not elsewhere classified: Secondary | ICD-10-CM | POA: Diagnosis not present

## 2016-11-25 DIAGNOSIS — I10 Essential (primary) hypertension: Secondary | ICD-10-CM | POA: Diagnosis not present

## 2016-11-25 DIAGNOSIS — L4050 Arthropathic psoriasis, unspecified: Secondary | ICD-10-CM | POA: Diagnosis not present

## 2016-11-25 DIAGNOSIS — J309 Allergic rhinitis, unspecified: Secondary | ICD-10-CM | POA: Diagnosis present

## 2016-11-25 DIAGNOSIS — Z79899 Other long term (current) drug therapy: Secondary | ICD-10-CM

## 2016-11-25 DIAGNOSIS — Z6824 Body mass index (BMI) 24.0-24.9, adult: Secondary | ICD-10-CM

## 2016-11-25 DIAGNOSIS — Z888 Allergy status to other drugs, medicaments and biological substances status: Secondary | ICD-10-CM

## 2016-11-25 DIAGNOSIS — Z9049 Acquired absence of other specified parts of digestive tract: Secondary | ICD-10-CM

## 2016-11-25 DIAGNOSIS — Y95 Nosocomial condition: Secondary | ICD-10-CM | POA: Diagnosis present

## 2016-11-25 DIAGNOSIS — G4739 Other sleep apnea: Secondary | ICD-10-CM

## 2016-11-25 DIAGNOSIS — R0789 Other chest pain: Secondary | ICD-10-CM | POA: Diagnosis not present

## 2016-11-25 DIAGNOSIS — Z87442 Personal history of urinary calculi: Secondary | ICD-10-CM

## 2016-11-25 DIAGNOSIS — L409 Psoriasis, unspecified: Secondary | ICD-10-CM | POA: Diagnosis present

## 2016-11-25 DIAGNOSIS — F4310 Post-traumatic stress disorder, unspecified: Secondary | ICD-10-CM | POA: Diagnosis not present

## 2016-11-25 DIAGNOSIS — J84112 Idiopathic pulmonary fibrosis: Secondary | ICD-10-CM | POA: Diagnosis not present

## 2016-11-25 DIAGNOSIS — R0602 Shortness of breath: Secondary | ICD-10-CM | POA: Diagnosis not present

## 2016-11-25 DIAGNOSIS — J9601 Acute respiratory failure with hypoxia: Secondary | ICD-10-CM | POA: Diagnosis present

## 2016-11-25 DIAGNOSIS — E43 Unspecified severe protein-calorie malnutrition: Secondary | ICD-10-CM | POA: Diagnosis not present

## 2016-11-25 DIAGNOSIS — J189 Pneumonia, unspecified organism: Principal | ICD-10-CM | POA: Diagnosis present

## 2016-11-25 DIAGNOSIS — J181 Lobar pneumonia, unspecified organism: Secondary | ICD-10-CM | POA: Insufficient documentation

## 2016-11-25 DIAGNOSIS — J8489 Other specified interstitial pulmonary diseases: Secondary | ICD-10-CM | POA: Diagnosis not present

## 2016-11-25 DIAGNOSIS — Z7982 Long term (current) use of aspirin: Secondary | ICD-10-CM

## 2016-11-25 DIAGNOSIS — E876 Hypokalemia: Secondary | ICD-10-CM | POA: Diagnosis not present

## 2016-11-25 DIAGNOSIS — E8770 Fluid overload, unspecified: Secondary | ICD-10-CM | POA: Diagnosis not present

## 2016-11-25 DIAGNOSIS — J810 Acute pulmonary edema: Secondary | ICD-10-CM | POA: Diagnosis not present

## 2016-11-25 DIAGNOSIS — J849 Interstitial pulmonary disease, unspecified: Secondary | ICD-10-CM | POA: Diagnosis not present

## 2016-11-25 DIAGNOSIS — Z86010 Personal history of colonic polyps: Secondary | ICD-10-CM

## 2016-11-25 DIAGNOSIS — R739 Hyperglycemia, unspecified: Secondary | ICD-10-CM | POA: Diagnosis not present

## 2016-11-25 DIAGNOSIS — Z880 Allergy status to penicillin: Secondary | ICD-10-CM

## 2016-11-25 DIAGNOSIS — G4733 Obstructive sleep apnea (adult) (pediatric): Secondary | ICD-10-CM | POA: Diagnosis present

## 2016-11-25 DIAGNOSIS — Z515 Encounter for palliative care: Secondary | ICD-10-CM

## 2016-11-25 DIAGNOSIS — G2581 Restless legs syndrome: Secondary | ICD-10-CM | POA: Diagnosis not present

## 2016-11-25 DIAGNOSIS — R9431 Abnormal electrocardiogram [ECG] [EKG]: Secondary | ICD-10-CM | POA: Diagnosis not present

## 2016-11-25 DIAGNOSIS — T380X5A Adverse effect of glucocorticoids and synthetic analogues, initial encounter: Secondary | ICD-10-CM | POA: Diagnosis not present

## 2016-11-25 DIAGNOSIS — Z66 Do not resuscitate: Secondary | ICD-10-CM | POA: Diagnosis not present

## 2016-11-25 DIAGNOSIS — G4731 Primary central sleep apnea: Secondary | ICD-10-CM

## 2016-11-25 DIAGNOSIS — M545 Low back pain: Secondary | ICD-10-CM | POA: Diagnosis present

## 2016-11-25 DIAGNOSIS — Z91030 Bee allergy status: Secondary | ICD-10-CM

## 2016-11-25 DIAGNOSIS — Z881 Allergy status to other antibiotic agents status: Secondary | ICD-10-CM

## 2016-11-25 DIAGNOSIS — Z7951 Long term (current) use of inhaled steroids: Secondary | ICD-10-CM

## 2016-11-25 DIAGNOSIS — I340 Nonrheumatic mitral (valve) insufficiency: Secondary | ICD-10-CM | POA: Diagnosis not present

## 2016-11-25 LAB — CBC
HCT: 38.7 % — ABNORMAL LOW (ref 39.0–52.0)
Hemoglobin: 13.4 g/dL (ref 13.0–17.0)
MCH: 32.4 pg (ref 26.0–34.0)
MCHC: 34.6 g/dL (ref 30.0–36.0)
MCV: 93.7 fL (ref 78.0–100.0)
PLATELETS: 165 10*3/uL (ref 150–400)
RBC: 4.13 MIL/uL — ABNORMAL LOW (ref 4.22–5.81)
RDW: 13.6 % (ref 11.5–15.5)
WBC: 8.9 10*3/uL (ref 4.0–10.5)

## 2016-11-25 LAB — DIFFERENTIAL
BASOS PCT: 0 %
Basophils Absolute: 0 10*3/uL (ref 0.0–0.1)
Eosinophils Absolute: 0.5 10*3/uL (ref 0.0–0.7)
Eosinophils Relative: 6 %
LYMPHS PCT: 12 %
Lymphs Abs: 1 10*3/uL (ref 0.7–4.0)
Monocytes Absolute: 0.9 10*3/uL (ref 0.1–1.0)
Monocytes Relative: 11 %
NEUTROS ABS: 6.4 10*3/uL (ref 1.7–7.7)
NEUTROS PCT: 71 %

## 2016-11-25 LAB — COMPREHENSIVE METABOLIC PANEL
ALBUMIN: 3.2 g/dL — AB (ref 3.5–5.0)
ALT: 13 U/L — ABNORMAL LOW (ref 17–63)
ANION GAP: 11 (ref 5–15)
AST: 18 U/L (ref 15–41)
Alkaline Phosphatase: 101 U/L (ref 38–126)
BILIRUBIN TOTAL: 0.8 mg/dL (ref 0.3–1.2)
BUN: 12 mg/dL (ref 6–20)
CHLORIDE: 99 mmol/L — AB (ref 101–111)
CO2: 27 mmol/L (ref 22–32)
Calcium: 8.9 mg/dL (ref 8.9–10.3)
Creatinine, Ser: 1.08 mg/dL (ref 0.61–1.24)
GFR calc Af Amer: >60 mL/min (ref >60)
Glucose, Bld: 127 mg/dL — ABNORMAL HIGH (ref 65–99)
Potassium: 3.2 mmol/L — ABNORMAL LOW (ref 3.5–5.1)
Sodium: 137 mmol/L (ref 135–145)
TOTAL PROTEIN: 6.6 g/dL (ref 6.5–8.1)

## 2016-11-25 MED ORDER — PANTOPRAZOLE SODIUM 40 MG PO TBEC
40.0000 mg | DELAYED_RELEASE_TABLET | Freq: Every day | ORAL | Status: DC
  Administered 2016-11-26 – 2016-11-30 (×5): 40 mg via ORAL
  Filled 2016-11-25 (×5): qty 1

## 2016-11-25 MED ORDER — AMLODIPINE BESYLATE 10 MG PO TABS
10.0000 mg | ORAL_TABLET | Freq: Every day | ORAL | Status: DC
  Administered 2016-11-26 – 2016-12-05 (×10): 10 mg via ORAL
  Filled 2016-11-25 (×12): qty 1

## 2016-11-25 MED ORDER — ENOXAPARIN SODIUM 40 MG/0.4ML ~~LOC~~ SOLN
40.0000 mg | Freq: Every day | SUBCUTANEOUS | Status: DC
  Administered 2016-11-25 – 2016-12-03 (×9): 40 mg via SUBCUTANEOUS
  Filled 2016-11-25 (×9): qty 0.4

## 2016-11-25 MED ORDER — TAMSULOSIN HCL 0.4 MG PO CAPS
0.4000 mg | ORAL_CAPSULE | Freq: Every day | ORAL | Status: DC
  Administered 2016-11-26 – 2016-12-06 (×11): 0.4 mg via ORAL
  Filled 2016-11-25 (×11): qty 1

## 2016-11-25 MED ORDER — ASPIRIN EC 81 MG PO TBEC
81.0000 mg | DELAYED_RELEASE_TABLET | Freq: Every day | ORAL | Status: DC
  Administered 2016-11-26 – 2016-12-05 (×10): 81 mg via ORAL
  Filled 2016-11-25 (×11): qty 1

## 2016-11-25 MED ORDER — MELATONIN 5 MG PO TABS: 15.0000 mg | ORAL_TABLET | Freq: Every day | ORAL | Status: DC

## 2016-11-25 MED ORDER — FLUTICASONE PROPIONATE 50 MCG/ACT NA SUSP
2.0000 | Freq: Every day | NASAL | Status: DC
Start: 2016-11-26 — End: 2016-11-25

## 2016-11-25 MED ORDER — FLUTICASONE PROPIONATE 50 MCG/ACT NA SUSP
2.0000 | Freq: Every day | NASAL | Status: DC
  Administered 2016-11-26 – 2016-12-05 (×10): 2 via NASAL
  Filled 2016-11-25: qty 16

## 2016-11-25 MED ORDER — VANCOMYCIN HCL IN DEXTROSE 1-5 GM/200ML-% IV SOLN
1000.0000 mg | Freq: Once | INTRAVENOUS | Status: AC
  Administered 2016-11-26: 1000 mg via INTRAVENOUS
  Filled 2016-11-25: qty 200

## 2016-11-25 MED ORDER — AZTREONAM 2 G IJ SOLR: 2.0000 g | Freq: Once | INTRAMUSCULAR | Status: DC

## 2016-11-25 MED ORDER — ZOLPIDEM TARTRATE 5 MG PO TABS
5.0000 mg | ORAL_TABLET | Freq: Every evening | ORAL | Status: DC | PRN
  Administered 2016-11-26 – 2016-12-02 (×8): 5 mg via ORAL
  Filled 2016-11-25 (×8): qty 1

## 2016-11-25 MED ORDER — DEXTROSE 5 % IV SOLN
2.0000 g | Freq: Once | INTRAVENOUS | Status: AC
  Administered 2016-11-25: 2 g via INTRAVENOUS
  Filled 2016-11-25: qty 2

## 2016-11-25 MED ORDER — SERTRALINE HCL 100 MG PO TABS
100.0000 mg | ORAL_TABLET | Freq: Two times a day (BID) | ORAL | Status: DC
Start: 2016-11-25 — End: 2016-12-05
  Administered 2016-11-26 – 2016-12-05 (×19): 100 mg via ORAL
  Filled 2016-11-25 (×19): qty 1

## 2016-11-25 MED ORDER — BUDESONIDE 0.25 MG/2ML IN SUSP: 0.2500 mg | Freq: Two times a day (BID) | RESPIRATORY_TRACT | Status: DC

## 2016-11-25 MED ORDER — ADULT MULTIVITAMIN W/MINERALS CH
1.0000 | ORAL_TABLET | Freq: Every day | ORAL | Status: DC
  Administered 2016-11-26 – 2016-12-05 (×10): 1 via ORAL
  Filled 2016-11-25 (×10): qty 1

## 2016-11-25 MED ORDER — CEFEPIME HCL 1 G IJ SOLR
1.0000 g | Freq: Three times a day (TID) | INTRAMUSCULAR | Status: DC
  Administered 2016-11-26 – 2016-11-29 (×10): 1 g via INTRAVENOUS
  Filled 2016-11-25 (×12): qty 1

## 2016-11-25 MED ORDER — SODIUM CHLORIDE 0.9 % IV SOLN: INTRAVENOUS | Status: DC

## 2016-11-25 MED ORDER — IPRATROPIUM-ALBUTEROL 0.5-2.5 (3) MG/3ML IN SOLN
3.0000 mL | RESPIRATORY_TRACT | Status: DC | PRN
  Administered 2016-11-30 – 2016-12-03 (×2): 3 mL via RESPIRATORY_TRACT
  Filled 2016-11-25 (×2): qty 3

## 2016-11-25 MED ORDER — POTASSIUM CHLORIDE CRYS ER 20 MEQ PO TBCR
40.0000 meq | EXTENDED_RELEASE_TABLET | Freq: Once | ORAL | Status: AC
  Administered 2016-11-26: 40 meq via ORAL
  Filled 2016-11-25: qty 2

## 2016-11-25 MED ORDER — IOPAMIDOL (ISOVUE-300) INJECTION 61%
100.0000 mL | Freq: Once | INTRAVENOUS | Status: AC | PRN
  Administered 2016-11-25: 80 mL via INTRAVENOUS

## 2016-11-25 NOTE — ED Notes (Signed)
Pt had drawn for labs:  Gold Blue Lt green  Dark green x2  Tried to do lavender but blood hemolyzed, would not pass out of syringe by the time got to the lavender tube to fill.

## 2016-11-25 NOTE — Progress Notes (Signed)
A consult was received from an ED physician for vancomycin and cefepime (pt got ceftriaxone in the past) per pharmacy dosing.  The patient's profile has been reviewed for ht/wt/allergies/indication/available labs.     A one time order has been placed for vancomycin 1gm IV x1 and cefepime 2gm IV x1.  Further antibiotics/pharmacy consults should be ordered by admitting physician if indicated.                       Thank you, Lynelle Doctor 12/13/2016  8:25 PM

## 2016-11-25 NOTE — ED Triage Notes (Signed)
Pt presents with increased dyspnea and dry cough. CT scan done today showed pneumonia. Oxygen sat at triage was 88% on RA. Sats 95% 2L [place]. Pt is alert/oriented.
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
## 2016-11-26 DIAGNOSIS — J849 Interstitial pulmonary disease, unspecified: Secondary | ICD-10-CM

## 2016-11-26 DIAGNOSIS — G4731 Primary central sleep apnea: Secondary | ICD-10-CM

## 2016-11-26 DIAGNOSIS — J9601 Acute respiratory failure with hypoxia: Secondary | ICD-10-CM

## 2016-11-26 LAB — RESPIRATORY PANEL BY PCR
ADENOVIRUS-RVPPCR: NOT DETECTED
Bordetella pertussis: NOT DETECTED
CORONAVIRUS 229E-RVPPCR: NOT DETECTED
CORONAVIRUS NL63-RVPPCR: NOT DETECTED
CORONAVIRUS OC43-RVPPCR: NOT DETECTED
Chlamydophila pneumoniae: NOT DETECTED
Coronavirus HKU1: NOT DETECTED
INFLUENZA B-RVPPCR: NOT DETECTED
Influenza A: NOT DETECTED
Metapneumovirus: NOT DETECTED
Mycoplasma pneumoniae: NOT DETECTED
PARAINFLUENZA VIRUS 1-RVPPCR: NOT DETECTED
Parainfluenza Virus 2: NOT DETECTED
Parainfluenza Virus 3: NOT DETECTED
Parainfluenza Virus 4: NOT DETECTED
RESPIRATORY SYNCYTIAL VIRUS-RVPPCR: NOT DETECTED
Rhinovirus / Enterovirus: NOT DETECTED

## 2016-11-26 LAB — CBC WITH DIFFERENTIAL/PLATELET
BASOS PCT: 0 %
Basophils Absolute: 0 10*3/uL (ref 0.0–0.1)
Eosinophils Absolute: 0.5 10*3/uL (ref 0.0–0.7)
Eosinophils Relative: 7 %
HEMATOCRIT: 34.3 % — AB (ref 39.0–52.0)
HEMOGLOBIN: 11.8 g/dL — AB (ref 13.0–17.0)
LYMPHS ABS: 0.9 10*3/uL (ref 0.7–4.0)
Lymphocytes Relative: 13 %
MCH: 32.2 pg (ref 26.0–34.0)
MCHC: 34.4 g/dL (ref 30.0–36.0)
MCV: 93.5 fL (ref 78.0–100.0)
MONO ABS: 0.8 10*3/uL (ref 0.1–1.0)
MONOS PCT: 12 %
NEUTROS ABS: 4.7 10*3/uL (ref 1.7–7.7)
NEUTROS PCT: 68 %
Platelets: 146 10*3/uL — ABNORMAL LOW (ref 150–400)
RBC: 3.67 MIL/uL — ABNORMAL LOW (ref 4.22–5.81)
RDW: 13.7 % (ref 11.5–15.5)
WBC: 6.9 10*3/uL (ref 4.0–10.5)

## 2016-11-26 LAB — BASIC METABOLIC PANEL
ANION GAP: 8 (ref 5–15)
BUN: 13 mg/dL (ref 6–20)
CALCIUM: 8.8 mg/dL — AB (ref 8.9–10.3)
CHLORIDE: 103 mmol/L (ref 101–111)
CO2: 27 mmol/L (ref 22–32)
Creatinine, Ser: 1.05 mg/dL (ref 0.61–1.24)
GFR calc Af Amer: >60 mL/min (ref >60)
GFR calc non Af Amer: >60 mL/min (ref >60)
Glucose, Bld: 138 mg/dL — ABNORMAL HIGH (ref 65–99)
Potassium: 3.3 mmol/L — ABNORMAL LOW (ref 3.5–5.1)
Sodium: 138 mmol/L (ref 135–145)

## 2016-11-26 LAB — TROPONIN I: Troponin I: <0.03 ng/mL (ref <0.03)

## 2016-11-26 LAB — PROCALCITONIN
PROCALCITONIN: 0.15 ng/mL
Procalcitonin: <0.1 ng/mL

## 2016-11-26 LAB — LACTIC ACID, PLASMA
LACTIC ACID, VENOUS: 1.2 mmol/L (ref 0.5–1.9)
Lactic Acid, Venous: 0.7 mmol/L (ref 0.5–1.9)

## 2016-11-26 LAB — SEDIMENTATION RATE: SED RATE: 84 mm/h — AB (ref 0–16)

## 2016-11-26 LAB — CK: Total CK: 15 U/L — ABNORMAL LOW (ref 49–397)

## 2016-11-26 LAB — GLUCOSE, CAPILLARY
GLUCOSE-CAPILLARY: 273 mg/dL — AB (ref 65–99)
Glucose-Capillary: 178 mg/dL — ABNORMAL HIGH (ref 65–99)

## 2016-11-26 LAB — STREP PNEUMONIAE URINARY ANTIGEN: Strep Pneumo Urinary Antigen: NEGATIVE

## 2016-11-26 MED ORDER — BOOST / RESOURCE BREEZE PO LIQD
1.0000 | Freq: Two times a day (BID) | ORAL | Status: DC
  Administered 2016-11-26 – 2016-12-02 (×12): 1 via ORAL

## 2016-11-26 MED ORDER — METHYLPREDNISOLONE SODIUM SUCC 125 MG IJ SOLR
125.0000 mg | Freq: Four times a day (QID) | INTRAMUSCULAR | Status: AC
  Administered 2016-11-26 – 2016-11-30 (×15): 125 mg via INTRAVENOUS
  Filled 2016-11-26 (×15): qty 2

## 2016-11-26 MED ORDER — ACETAMINOPHEN 325 MG PO TABS
650.0000 mg | ORAL_TABLET | Freq: Four times a day (QID) | ORAL | Status: DC | PRN
  Administered 2016-11-26 – 2016-11-29 (×7): 650 mg via ORAL
  Filled 2016-11-26 (×7): qty 2

## 2016-11-26 MED ORDER — VANCOMYCIN HCL 10 G IV SOLR
1250.0000 mg | Freq: Two times a day (BID) | INTRAVENOUS | Status: DC
  Administered 2016-11-26 – 2016-11-28 (×6): 1250 mg via INTRAVENOUS
  Filled 2016-11-26 (×7): qty 1250

## 2016-11-26 MED ORDER — ENSURE ENLIVE PO LIQD
237.0000 mL | Freq: Two times a day (BID) | ORAL | Status: DC
  Administered 2016-11-26 – 2016-12-06 (×9): 237 mL via ORAL

## 2016-11-26 MED ORDER — MORPHINE SULFATE (PF) 4 MG/ML IV SOLN
2.0000 mg | INTRAVENOUS | Status: DC | PRN
  Administered 2016-11-26 – 2016-12-04 (×4): 2 mg via INTRAVENOUS
  Filled 2016-11-26 (×5): qty 1

## 2016-11-26 MED ORDER — INSULIN ASPART 100 UNIT/ML ~~LOC~~ SOLN
0.0000 [IU] | Freq: Three times a day (TID) | SUBCUTANEOUS | Status: DC
  Administered 2016-11-26: 3 [IU] via SUBCUTANEOUS
  Administered 2016-11-27: 5 [IU] via SUBCUTANEOUS
  Administered 2016-11-27: 8 [IU] via SUBCUTANEOUS
  Administered 2016-11-27: 11 [IU] via SUBCUTANEOUS
  Administered 2016-11-28: 15 [IU] via SUBCUTANEOUS
  Administered 2016-11-28 (×2): 11 [IU] via SUBCUTANEOUS
  Administered 2016-11-29: 8 [IU] via SUBCUTANEOUS
  Administered 2016-11-29 – 2016-11-30 (×3): 15 [IU] via SUBCUTANEOUS
  Administered 2016-11-30: 8 [IU] via SUBCUTANEOUS
  Administered 2016-11-30: 11 [IU] via SUBCUTANEOUS
  Administered 2016-12-01: 5 [IU] via SUBCUTANEOUS
  Administered 2016-12-01: 15 [IU] via SUBCUTANEOUS
  Administered 2016-12-02: 8 [IU] via SUBCUTANEOUS

## 2016-11-26 MED ORDER — POTASSIUM CHLORIDE CRYS ER 20 MEQ PO TBCR
40.0000 meq | EXTENDED_RELEASE_TABLET | Freq: Once | ORAL | Status: AC
  Administered 2016-11-26: 40 meq via ORAL
  Filled 2016-11-26: qty 2

## 2016-11-26 MED ORDER — ALUM & MAG HYDROXIDE-SIMETH 200-200-20 MG/5ML PO SUSP
30.0000 mL | ORAL | Status: DC | PRN
  Administered 2016-11-26 – 2016-12-02 (×2): 30 mL via ORAL
  Filled 2016-11-26 (×2): qty 30

## 2016-11-26 NOTE — Progress Notes (Signed)
Pharmacy Antibiotic Note  Brandon Schroeder. is a 71 y.o. male admitted on 12/01/2016 with pneumonia.  Pharmacy has been consulted for Vancomycin dosing.  Plan: Vancomycin 1250mg  IV every 12 hours.  Goal trough 15-20 mcg/mL.  Height: 6\' 3"  (190.5 cm) Weight: 204 lb 8 oz (92.8 kg) IBW/kg (Calculated) : 84.5  Temp (24hrs), Avg:98.5 F (36.9 C), Min:98.4 F (36.9 C), Max:98.5 F (36.9 C)   Recent Labs Lab 11/20/16 0842 11/24/2016 1811 11/24/2016 1934 11/26/16 0003 11/26/16 0314  WBC 7.1  --  8.9  --  6.9  CREATININE 1.11 1.08  --   --  1.05  LATICACIDVEN  --   --   --  1.2 0.7    Estimated Creatinine Clearance: 78.2 mL/min (by C-G formula based on SCr of 1.05 mg/dL).    Allergies  Allergen Reactions  . Bee Venom Anaphylaxis  . Levofloxacin Itching  . Penicillins Hives    ++ for ceftriaxone in the past++ Has patient had a PCN reaction causing immediate rash, facial/tongue/throat swelling, SOB or lightheadedness with hypotension: unknown Has patient had a PCN reaction causing severe rash involving mucus membranes or skin necrosis: unknown Has patient had a PCN reaction that required hospitalization No Has patient had a PCN reaction occurring within the last 10 years: No If all of the above answers are "NO", then may proceed with Cephalosporin use.   . Trazodone And Nefazodone Swelling    Throat swells    Antimicrobials this admission: Vancomycin 11/24/2016 >> Cefepime 11/23/2016 >>  Dose adjustments this admission: -  Microbiology results: pending  Thank you for allowing pharmacy to be a part of this patient's care.  Nani Skillern Crowford 11/26/2016 5:30 AM
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
## 2016-11-26 NOTE — Progress Notes (Addendum)
PROGRESS NOTE  Brandon Schroeder.  OZH:[id number] DOB: 1945/11/05 DOA: 12/16/2016 PCP: Laurey Morale, MD  Brief Narrative: Brandon Schroeder. is a 71 y.o. male with a history of HTN, depression/anxiety who presented to the ED 10/10 with worsening dry cough and dyspnea. He's had worsening shortness of breath for about 6 months that worsened stepwise around the time of cholecystectomy earlier this year and over the past 2 weeks began having intermittent low grade fevers. He had been given steroids for possible costochondritis followed by ceftin for a pneumonia seen on outpatient CT scan without improvement. On arrival SpO2 was 88% on room air. CT chest in ED revealed peribronchial vascular ground-glass and consolidation which is new on background of fibrotic ILD. Empiric antibiotics were started and he was admitted. Pulmonology was consulted, recommending steroids and holding methotrexate, thought to be a trigger for worsening symptoms coinciding with increasing doses/different routes of administration.   Assessment & Plan: Principal Problem:   HCAP (healthcare-associated pneumonia) Active Problems:   Hypertension   Depression with anxiety   Complex sleep apnea syndrome   Hypokalemia   Acute hypoxemic respiratory failure: Uncertain etiology. PCT negative.  - Appreciate pulmonology consult, most likely in differential is undiagnosed IPF with flare (+FH), possibly methotrexate-related acute hypersensitivity pneumonitis, BOOP.  - Continue supplemental oxygen to maintain SpO2 >88%. Reviewed symptoms of decompensation and confirmed desire for transfer to SDU for BiPAP and possibly intubation if needed.  - High dose IV steroids - Would recommend DC abx if cultures remain negative.  - No bronchoscopy/lung Bx at this time - Holding MTX - Droplet precautions pending RVP  Sleep apnea:  - Continue CPAP qHS  HTN: Chronic, stable - Continue norvasc  Anxiety/depression: Chronic stable. Monitor  for adjustment disorder given his father died of IPF.  - Continue zoloft  Hypokalemia: Mild - Repeat replacement and recheck in AM  DVT prophylaxis: Lovenox Code Status: Full, confirmed. Family Communication: Wife at bedside Disposition Plan: Continue inpatient management  Consultants:   Pulmonology, Dr. Chase Caller  Procedures:   None  Antimicrobials:  Vancomycin, cefepime 10/10 >   Subjective: Still with extreme dyspnea with any exertion, intermittent fevers and light headedness. SpO2 dropping into mid 80's despite 5L O2 when standing to urinate.   Objective: BP 129/84 (BP Location: Left Arm)   Pulse 83   Temp 100.1 F (37.8 C) (Oral)   Resp 16   Ht 6\' 3"  (1.905 m)   Wt 92.8 kg (204 lb 8 oz)   SpO2 95%   BMI 25.56 kg/m   Gen: Diaphoretic male in no distress Pulm: No accessory muscle use, but tachypneic dropping to mid 80's just with rising for posterior auscultation. Scattered dry crackles bilaterally. CV: Regular rate and rhythm. No murmur, rub, or gallop. No JVD, no pedal edema. GI: Abdomen soft, non-tender, non-distended, with normoactive bowel sounds. No organomegaly or masses felt. Ext: Warm, no deformities Skin: No rashes, lesions or ulcers Neuro: Alert and oriented. No focal neurological deficits. Psych: Judgement and insight appear normal. Mood & affect appropriate.   Data Reviewed: I have personally reviewed following labs and imaging studies  CBC:  Recent Labs Lab 11/20/16 0842 11/21/2016 1934 11/26/16 0314  WBC 7.1 8.9 6.9  NEUTROABS 5.3 6.4 4.7  HGB 13.3 13.4 11.8*  HCT 39.4 38.7* 34.3*  MCV 96.4 93.7 93.5  PLT 194.0 165 629*   Basic Metabolic Panel:  Recent Labs Lab 11/20/16 0842 11/26/2016 1811 11/26/16 0314  NA 140 137 138  K  3.8 3.2* 3.3*  CL 99 99* 103  CO2 31 27 27   GLUCOSE 115* 127* 138*  BUN 12 12 13   CREATININE 1.11 1.08 1.05  CALCIUM 9.2 8.9 8.8*   GFR: Estimated Creatinine Clearance: 78.2 mL/min (by C-G formula based on  SCr of 1.05 mg/dL). Liver Function Tests:  Recent Labs Lab 11/20/16 0842 12/11/2016 1811  AST 12 18  ALT 10 13*  ALKPHOS 99 101  BILITOT 0.7 0.8  PROT 6.1 6.6  ALBUMIN 3.6 3.2*   No results for input(s): LIPASE, AMYLASE in the last 168 hours. No results for input(s): AMMONIA in the last 168 hours. Coagulation Profile: No results for input(s): INR, PROTIME in the last 168 hours. Cardiac Enzymes: No results for input(s): CKTOTAL, CKMB, CKMBINDEX, TROPONINI in the last 168 hours. BNP (last 3 results) No results for input(s): PROBNP in the last 8760 hours. HbA1C: No results for input(s): HGBA1C in the last 72 hours. CBG: No results for input(s): GLUCAP in the last 168 hours. Lipid Profile: No results for input(s): CHOL, HDL, LDLCALC, TRIG, CHOLHDL, LDLDIRECT in the last 72 hours. Thyroid Function Tests: No results for input(s): TSH, T4TOTAL, FREET4, T3FREE, THYROIDAB in the last 72 hours. Anemia Panel: No results for input(s): VITAMINB12, FOLATE, FERRITIN, TIBC, IRON, RETICCTPCT in the last 72 hours. Urine analysis:    Component Value Date/Time   COLORURINE YELLOW 08/16/2016 1305   APPEARANCEUR CLEAR 08/16/2016 1305   LABSPEC 1.016 08/16/2016 1305   PHURINE 6.0 08/16/2016 1305   GLUCOSEU >=500 (A) 08/16/2016 1305   HGBUR MODERATE (A) 08/16/2016 1305   BILIRUBINUR NEGATIVE 08/16/2016 1305   BILIRUBINUR n 02/19/2016 1407   KETONESUR 20 (A) 08/16/2016 1305   PROTEINUR 30 (A) 08/16/2016 1305   UROBILINOGEN 0.2 02/19/2016 1407   NITRITE NEGATIVE 08/16/2016 1305   LEUKOCYTESUR NEGATIVE 08/16/2016 1305   No results found for this or any previous visit (from the past 240 hour(s)).    Radiology Studies: Ct Chest W Contrast  Result Date: 11/26/2016 CLINICAL DATA:  Shortness of breath and chest pain, chills and fever. EXAM: CT CHEST WITH CONTRAST TECHNIQUE: Multidetector CT imaging of the chest was performed during intravenous contrast administration. CONTRAST:  37mL ISOVUE-300  IOPAMIDOL (ISOVUE-300) INJECTION 61% COMPARISON:  CT abdomen pelvis 08/16/2016. FINDINGS: Cardiovascular: Atherosclerotic calcification of the arterial vasculature, including moderate to severe involvement of the coronary arteries. Aortic valvular calcification. Heart is mildly enlarged. No pericardial effusion. Mediastinum/Nodes: Mediastinal and hilar lymph nodes are not enlarged by CT size criteria. No axillary adenopathy. Esophagus is grossly unremarkable. Lungs/Pleura: There is patchy peribronchovascular consolidation and ground-glass superimposed on subpleural reticulation and traction bronchiolectasis. Consolidation and ground-glass are new from 08/16/2016. No pleural fluid. Airway is unremarkable. Upper Abdomen: Visualized portions of the liver, adrenal glands, right kidney, spleen, pancreas, stomach and bowel are unremarkable with exception of a small hiatal hernia. Cholecystectomy. No upper abdominal adenopathy. Musculoskeletal: Degenerative changes in the spine. Old right rib fractures. T12 compression fracture, unchanged. IMPRESSION: 1. Patchy peribronchovascular ground-glass and consolidation, new from 08/16/2016 and likely due to bronchopneumonia. Organizing pneumonia is not excluded. 2. Underlying fibrotic interstitial lung disease. Consider high-resolution chest CT in further evaluation, when acute symptoms abate, as clinically indicated. 3. Aortic atherosclerosis (ICD10-170.0). Moderate to severe involvement of the coronary arteries. Electronically Signed   By: Lorin Picket M.D.   On: 12/12/2016 16:22    Scheduled Meds: . amLODipine  10 mg Oral Daily  . aspirin EC  81 mg Oral Daily  . enoxaparin (LOVENOX) injection  40 mg Subcutaneous QHS  . feeding supplement  1 Container Oral BID WC  . feeding supplement (ENSURE ENLIVE)  237 mL Oral BID BM  . fluticasone  2 spray Each Nare Daily  . insulin aspart  0-15 Units Subcutaneous TID WC  . methylPREDNISolone (SOLU-MEDROL) injection  125 mg  Intravenous Q6H  . multivitamin with minerals  1 tablet Oral Daily  . pantoprazole  40 mg Oral Daily  . sertraline  100 mg Oral BID  . tamsulosin  0.4 mg Oral QPC breakfast   Continuous Infusions: . ceFEPime (MAXIPIME) IV Stopped (11/26/16 0650)  . vancomycin Stopped (11/26/16 1159)     LOS: 1 day   Time spent: 25 minutes.  Vance Gather, MD [facility] Pager [phone]  If 7PM-7AM, please contact night-coverage [email or web address] Password [email or web address] 11/26/2016, 1:55 PM

## 2016-11-26 NOTE — Progress Notes (Signed)
Initial Nutrition Assessment  DOCUMENTATION CODES:   Severe malnutrition in context of acute illness/injury  INTERVENTION:   -Continue Ensure Enlive po BID mixed with Lactaid Milk, each supplement provides 350 kcal and 20 grams of protein -Provide Boost Breeze po BID with meals, each supplement provides 250 kcal and 9 grams of protein -RD will continue to monitor  NUTRITION DIAGNOSIS:   Malnutrition(severe) related to acute illness, poor appetite (HCAP) as evidenced by percent weight loss, energy intake < or equal to 50% for > or equal to 5 days, moderate depletions of muscle mass.  GOAL:   Patient will meet greater than or equal to 90% of their needs  MONITOR:   PO intake, Supplement acceptance, Labs, Weight trends, I & O's  REASON FOR ASSESSMENT:   Malnutrition Screening Tool    ASSESSMENT:   71 y.o. male with medical history significant for nterstitial lung disease, depression with anxiety, and hypertension, now presenting to the emergency department for evaluation of shortness of breath and cough. Patient reports upper respiratory symptoms and a cough for about a month now, and was being evaluated by his PCP for this. He was treated initially with a steroid taper, had no improvement, and is now completing a course of Ceftin, but continues to worsen. He reported intermittent fevers at home and a CT scan of the chest was obtained as an outpatient. There was concern for a pneumonia on the CT, and the patient was directed to the ED for further evaluation. In addition to the patient's dyspnea, he has also complained of some lightheadedness, but no syncope. No abdominal pain, nausea, or vomiting. Had some diarrhea recently, but now resolved. No recent travel or sick contacts. Had cholecystectomy in July 2018.   Patient in room with wife. Pt states he has not eaten much since 10/3 d/t poor appetite and difficulty breathing. Pt had diarrhea for 3 days prior to 10/3. Prior to symptoms pt  was eating 3 meals a day. Breakfast was normally a english muffin, lunch was a sandwich, and dinner would be pasta with some fish or other protein. Pt has been ordered Ensure supplements but pt states they are too thick. Recommended he try supplement thinned out with milk, pt request Lactaid Milk.  After visit, pt's wife requested a ginger ale for patient. RD provided a Boost Breeze to add to the ginger ale for patient to try. Pt's wife agreed with this plan.   Per chart review, pt has lost 20 lb since 8/21 (9% wt loss x 1.5 months, significant for time frame). Nutrition-Focused physical exam completed. Findings are no fat depletion, moderate muscle depletion, and no edema.   Medications: Multivitamin with minerals daily  Labs reviewed: Low K  Diet Order:  Diet regular Room service appropriate? Yes; Fluid consistency: Thin  Skin:  Reviewed, no issues  Last BM:  PTA  Height:   Ht Readings from Last 1 Encounters:  11/26/16 6\' 3"  (1.905 m)    Weight:   Wt Readings from Last 1 Encounters:  11/26/16 204 lb 8 oz (92.8 kg)    Ideal Body Weight:  89.1 kg  BMI:  Body mass index is 25.56 kg/m.  Estimated Nutritional Needs:   Kcal:  2100-2300  Protein:  110-120g  Fluid:  2.1L/day  EDUCATION NEEDS:   Education needs addressed  Clayton Bibles, MS, RD, LDN Pager: [phone] After Hours Pager: [phone]

## 2016-11-26 NOTE — Care Management Note (Signed)
Case Management Note  Patient Details  Name: Brandon Schroeder. MRN: [id number] Date of Birth: February 01, 1946  Subjective/Objective:                  pna  Action/Plan: Date:  November 26, 2016 Chart reviewed for concurrent status and case management needs.  Will continue to follow patient progress.  Discharge Planning: following for needs  Expected discharge date: [id number]  Velva Harman, BSN, [place], [place]   Expected Discharge Date:  11/27/16               Expected Discharge Plan:  Home/Self Care  In-House Referral:     Discharge planning Services  CM Consult  Post Acute Care Choice:    Choice offered to:     DME Arranged:    DME Agency:     HH Arranged:    HH Agency:     Status of Service:  In process, will continue to follow  If discussed at Long Length of Stay Meetings, dates discussed:    Additional Comments:  Leeroy Cha, RN 11/26/2016, 8:59 AM

## 2016-11-27 ENCOUNTER — Inpatient Hospital Stay (HOSPITAL_COMMUNITY): Payer: PPO

## 2016-11-27 DIAGNOSIS — J849 Interstitial pulmonary disease, unspecified: Secondary | ICD-10-CM

## 2016-11-27 DIAGNOSIS — I340 Nonrheumatic mitral (valve) insufficiency: Secondary | ICD-10-CM

## 2016-11-27 LAB — BASIC METABOLIC PANEL
ANION GAP: 11 (ref 5–15)
BUN: 21 mg/dL — ABNORMAL HIGH (ref 6–20)
CO2: 26 mmol/L (ref 22–32)
CREATININE: 1 mg/dL (ref 0.61–1.24)
Calcium: 9.2 mg/dL (ref 8.9–10.3)
Chloride: 104 mmol/L (ref 101–111)
GLUCOSE: 276 mg/dL — AB (ref 65–99)
Potassium: 4 mmol/L (ref 3.5–5.1)
Sodium: 141 mmol/L (ref 135–145)

## 2016-11-27 LAB — CBC
HEMATOCRIT: 34.1 % — AB (ref 39.0–52.0)
HEMOGLOBIN: 11.8 g/dL — AB (ref 13.0–17.0)
MCH: 32.2 pg (ref 26.0–34.0)
MCHC: 34.6 g/dL (ref 30.0–36.0)
MCV: 92.9 fL (ref 78.0–100.0)
PLATELETS: 147 10*3/uL — AB (ref 150–400)
RBC: 3.67 MIL/uL — AB (ref 4.22–5.81)
RDW: 13.4 % (ref 11.5–15.5)
WBC: 9.4 10*3/uL (ref 4.0–10.5)

## 2016-11-27 LAB — ANA W/REFLEX IF POSITIVE: Anti Nuclear Antibody(ANA): NEGATIVE

## 2016-11-27 LAB — MPO/PR-3 (ANCA) ANTIBODIES: Myeloperoxidase Abs: <9 U/mL (ref 0.0–9.0)

## 2016-11-27 LAB — MAGNESIUM: Magnesium: 1.8 mg/dL (ref 1.7–2.4)

## 2016-11-27 LAB — ANTIEXTRACTABLE NUCLEAR AG

## 2016-11-27 LAB — GLOMERULAR BASEMENT MEMBRANE ANTIBODIES: GBM Ab: 2 units (ref 0–20)

## 2016-11-27 LAB — ANTI-SCLERODERMA ANTIBODY

## 2016-11-27 LAB — GLUCOSE, CAPILLARY
Glucose-Capillary: 278 mg/dL — ABNORMAL HIGH (ref 65–99)
Glucose-Capillary: 322 mg/dL — ABNORMAL HIGH (ref 65–99)
Glucose-Capillary: 228 mg/dL — ABNORMAL HIGH (ref 65–99)
Glucose-Capillary: 299 mg/dL — ABNORMAL HIGH (ref 65–99)

## 2016-11-27 LAB — ANGIOTENSIN CONVERTING ENZYME: ANGIOTENSIN-CONVERTING ENZYME: 56 U/L (ref 14–82)

## 2016-11-27 LAB — ANCA TITERS

## 2016-11-27 LAB — ALDOLASE: ALDOLASE: 7.2 U/L (ref 3.3–10.3)

## 2016-11-27 LAB — ECHOCARDIOGRAM COMPLETE
Height: 75 in
Weight: 3272 oz

## 2016-11-27 LAB — ANTI-DNA ANTIBODY, DOUBLE-STRANDED: ds DNA Ab: <1 IU/mL (ref 0–9)

## 2016-11-27 LAB — HIV ANTIBODY (ROUTINE TESTING W REFLEX): HIV Screen 4th Generation wRfx: NONREACTIVE

## 2016-11-27 LAB — SJOGRENS SYNDROME-B EXTRACTABLE NUCLEAR ANTIBODY

## 2016-11-27 LAB — TROPONIN I

## 2016-11-27 LAB — SJOGRENS SYNDROME-A EXTRACTABLE NUCLEAR ANTIBODY: SSA (Ro) (ENA) Antibody, IgG: <0.2 AI (ref 0.0–0.9)

## 2016-11-27 MED ORDER — KETOROLAC TROMETHAMINE 30 MG/ML IJ SOLN
30.0000 mg | Freq: Four times a day (QID) | INTRAMUSCULAR | Status: AC | PRN
  Administered 2016-11-27 – 2016-11-29 (×4): 30 mg via INTRAVENOUS
  Filled 2016-11-27 (×5): qty 1

## 2016-11-27 MED ORDER — KETOROLAC TROMETHAMINE 15 MG/ML IJ SOLN
15.0000 mg | Freq: Once | INTRAMUSCULAR | Status: AC
  Administered 2016-11-27: 15 mg via INTRAVENOUS
  Filled 2016-11-27: qty 1

## 2016-11-27 NOTE — Progress Notes (Signed)
PROGRESS NOTE  Brandon Schroeder.  WER:[id number] DOB: [birth year] DOA: 12/14/2016  PCP: Laurey Morale, MD   Brief Narrative:  Patient is 71 year old male with known history of hypertension, depression, presented to emergency department with main concern of several weeks to months duration of progressively worsening dry cough and dyspnea. Patient reports dyspnea was initially present only with exertion but over the past several weeks he has noted significant dyspnea at rest. Patient also reports associated intermittent low-grade fevers and was recently treated with steroids for possible costochondritis followed by Ceftin for presumptive pneumonia that was seen on CT scan. Patient reports compliance with medications but his symptoms have not improved. He was admitted to [facility] for further evaluation.  On arrival to emergency department patient was hypoxic with oxygen saturation 88% on room air and worsening drop with ambulation. CT chest revealed peribronchial vascular opacity, consolidation, questionable background of ILD.ulmonary team was consulted, started steroids and empiric antibiotics.   Assessment & Plan:   Acute hypoxemic respiratory failure with bilateral diffuse pulmonary infiltrates - Etiology remains unclear, ? IPF especially given family history ( patient's father died of IPF), BOOP, Autoimmune versus other vasculitis - ? Methotrexate related acute hypersensitivity pneumonitis - I have reviewed imaging studies with patient at bedside - Current workup including following still pending:  Legionella urinary antigen  Aldolase  Anti scleroderma antibody  Sjogren A  Sjogren B  GBM  MPO/PR-3  ANCA titers  ANA  anti-DNA antibody  ECHO - follow-up on the tests outlined above, continue steroids per PCCM recommendation - patient still with low-grade fever, Tmax 100 F n the past 24 hours - cont vanc and cefepime for now, day #2, narrow down as PCCM  recommends  Sleep apnea:  - Continue CPAP qHS  HTN - Continue norvasc - overall stable   Anxiety/depression - Continue zoloft  Hypokalemia - supplemented and WNL  DVT prophylaxis: Lovenox SQ Code Status: Full, confirmed. Family Communication: Pt at bedside  Disposition Plan: home once medically stable   Consultants:   Pulmonology, Dr. Chase Caller  Procedures:   None  Antimicrobials:  Vancomycin, cefepime 10/10 >   Subjective: Still concerned with exertional dyspnea.   Objective: BP 122/76 (BP Location: Left Arm)   Pulse 62   Temp 97.6 F (36.4 C) (Oral)   Resp 18   Ht 6\' 3"  (1.905 m)   Wt 92.8 kg (204 lb 8 oz)   SpO2 91%   BMI 25.56 kg/m    Physical Exam  Constitutional: Appears calm, NAD CVS: RRR, S1/S2 +, no murmurs, no gallops, no carotid bruit.  Pulmonary: Effort and breath sounds normal, no stridor, rales at bases  Abdominal: Soft. BS +,  no distension, tenderness, rebound or guarding.  Musculoskeletal: Normal range of motion. No edema and no tenderness.  Lymphadenopathy: No lymphadenopathy noted, cervical, inguinal. Neuro: Alert. Normal reflexes, muscle tone coordination. No cranial nerve deficit. Skin: Skin is warm and dry. No rash noted. Not diaphoretic. No erythema. No pallor.  Psychiatric: Normal mood and affect. Behavior, judgment, thought content normal.   Data Reviewed: I have personally reviewed following labs and imaging studies  CBC:  Recent Labs Lab 12/15/2016 1934 11/26/16 0314 11/27/16 0559  WBC 8.9 6.9 9.4  NEUTROABS 6.4 4.7  --   HGB 13.4 11.8* 11.8*  HCT 38.7* 34.3* 34.1*  MCV 93.7 93.5 92.9  PLT 165 146* 195*   Basic Metabolic Panel:  Recent Labs Lab 11/29/2016 1811 11/26/16 0314 11/27/16 0559  NA 137 138 141  K 3.2* 3.3* 4.0  CL 99* 103 104  CO2 27 27 26   GLUCOSE 127* 138* 276*  BUN 12 13 21*  CREATININE 1.08 1.05 1.00  CALCIUM 8.9 8.8* 9.2  MG  --   --  1.8   Liver Function Tests:  Recent Labs Lab  11/18/2016 1811  AST 18  ALT 13*  ALKPHOS 101  BILITOT 0.8  PROT 6.6  ALBUMIN 3.2*   Cardiac Enzymes:  Recent Labs Lab 11/26/16 1330 11/26/16 1333 11/27/16 0559  CKTOTAL  --  15*  --   TROPONINI <0.03  --  <0.03   CBG:  Recent Labs Lab 11/26/16 1656 11/26/16 2153 11/27/16 0748  GLUCAP 178* 273* 278*   Urine analysis:    Component Value Date/Time   COLORURINE YELLOW 08/16/2016 1305   APPEARANCEUR CLEAR 08/16/2016 1305   LABSPEC 1.016 08/16/2016 1305   PHURINE 6.0 08/16/2016 1305   GLUCOSEU >=500 (A) 08/16/2016 1305   HGBUR MODERATE (A) 08/16/2016 1305   BILIRUBINUR NEGATIVE 08/16/2016 1305   BILIRUBINUR n 02/19/2016 1407   KETONESUR 20 (A) 08/16/2016 1305   PROTEINUR 30 (A) 08/16/2016 1305   UROBILINOGEN 0.2 02/19/2016 1407   NITRITE NEGATIVE 08/16/2016 1305   LEUKOCYTESUR NEGATIVE 08/16/2016 1305   Recent Results (from the past 240 hour(s))  Culture, blood (Routine X 2) w Reflex to ID Panel     Status: None (Preliminary result)   Collection Time: 11/23/2016  8:54 PM  Result Value Ref Range Status   Specimen Description BLOOD RIGHT HAND  Final   Special Requests   Final    IN PEDIATRIC BOTTLE Blood Culture results may not be optimal due to an excessive volume of blood received in culture bottles   Culture   Final    NO GROWTH < 24 HOURS Performed at [facility], [place] [address], [place], [place] [address]    Report Status PENDING  Incomplete  Culture, blood (Routine X 2) w Reflex to ID Panel     Status: None (Preliminary result)   Collection Time: 11/22/2016  8:54 PM  Result Value Ref Range Status   Specimen Description BLOOD RIGHT ANTECUBITAL  Final   Special Requests   Final    BOTTLES DRAWN AEROBIC AND ANAEROBIC Blood Culture adequate volume   Culture   Final    NO GROWTH < 24 HOURS Performed at [facility], [place] [address] [place], [place] [address]    Report Status PENDING  Incomplete  Respiratory Panel by PCR     Status: None    Collection Time: 11/26/16 12:46 PM  Result Value Ref Range Status   Adenovirus NOT DETECTED NOT DETECTED Final   Coronavirus 229E NOT DETECTED NOT DETECTED Final   Coronavirus HKU1 NOT DETECTED NOT DETECTED Final   Coronavirus NL63 NOT DETECTED NOT DETECTED Final   Coronavirus OC43 NOT DETECTED NOT DETECTED Final   Metapneumovirus NOT DETECTED NOT DETECTED Final   Rhinovirus / Enterovirus NOT DETECTED NOT DETECTED Final   Influenza A NOT DETECTED NOT DETECTED Final   Influenza B NOT DETECTED NOT DETECTED Final   Parainfluenza Virus 1 NOT DETECTED NOT DETECTED Final   Parainfluenza Virus 2 NOT DETECTED NOT DETECTED Final   Parainfluenza Virus 3 NOT DETECTED NOT DETECTED Final   Parainfluenza Virus 4 NOT DETECTED NOT DETECTED Final   Respiratory Syncytial Virus NOT DETECTED NOT DETECTED Final   Bordetella pertussis NOT DETECTED NOT DETECTED Final   Chlamydophila pneumoniae NOT DETECTED NOT DETECTED Final  Mycoplasma pneumoniae NOT DETECTED NOT DETECTED Final    Comment: Performed at [facility], [place] [address], [place], [place] [address]     Radiology Studies: No results found.  Scheduled Meds: . amLODipine  10 mg Oral Daily  . aspirin EC  81 mg Oral Daily  . enoxaparin (LOVENOX) injection  40 mg Subcutaneous QHS  . feeding supplement  1 Container Oral BID WC  . feeding supplement (ENSURE ENLIVE)  237 mL Oral BID BM  . fluticasone  2 spray Each Nare Daily  . insulin aspart  0-15 Units Subcutaneous TID WC  . methylPREDNISolone (SOLU-MEDROL) injection  125 mg Intravenous Q6H  . multivitamin with minerals  1 tablet Oral Daily  . pantoprazole  40 mg Oral Daily  . sertraline  100 mg Oral BID  . tamsulosin  0.4 mg Oral QPC breakfast   Continuous Infusions: . ceFEPime (MAXIPIME) IV Stopped (11/27/16 0549)  . vancomycin Stopped (11/27/16 1027)     LOS: 2 days   Time spent: 25 minutes.  Faye Ramsay, MD [facility] Pager [phone]  If 7PM-7AM, please  contact night-coverage [email or web address] Password [email or web address] 11/27/2016, 11:06 AM

## 2016-11-27 NOTE — Progress Notes (Signed)
  Echocardiogram 2D Echocardiogram has been performed.  Brandon Schroeder L Androw 11/27/2016, 9:23 AM

## 2016-11-27 NOTE — Progress Notes (Signed)
PULMONARY / CRITICAL CARE MEDICINE   Name: Brandon Schroeder. MRN: [id number] DOB: [birth year]    ADMISSION DATE:  12/03/2016 CONSULTATION DATE: Brandon Schroeder   REFERRING MD:  Dr Brandon Schroeder [facility]   CHIEF COMPLAINT:  Concern for ILD flare.  HISTORY OF PRESENT ILLNESS:   71 year old Brandon Schroeder who is originally from [place] but has lived in [place] and most recently in [place] but moved to [place], [place] 10 years ago to be near grandchild. His wife is retired Technical sales engineer at [employer] and ran the clinical trials office at [facility] in [place]. Consult has been now been requested for concern for acute flareup of interstitial lung disease  He tells me that his father died in the late 81s in [place] at [facility] from acute ventilator-dependent respiratory failure following a diagnosis of IPF. Patient himself has never had any respiratory complaints. He leads an active life but is not overtly athletic. For these activities he has never noticed shortness of breath up until 5 or 6 months ago. Wife says this coincides with starting oral methotrexate for psoriatic arthritis. Then approximately in August 2018 after gallbladder surgery the oral methotrexate was switched to subcutaneous methotrexate injection. Some 2 weeks ago the dose was increased. He follows for psoriatic arthritis with Dr. Leigh Schroeder. Coinciding with this in the last 4 weeks his dyspnea and cough started worsening along with feelings of subjective fever low grade intermittently. Dyspnea is progressed to class III-IV levels. In the emergency room department yesterday was saturating 88% on room air. Since admission he's been on 4 L nasal cannula. He has been afebrile and the sepsis biomarkers pro calcitonin is relatively benign. His lactic acid is normal. White count is normal urine streptococcus is negative all suggesting he does not have a bacterial process. He does not endorse any acute abrupt  decline. He also denies any autoimmune disease. He denies any birds in the house of feathered pillows all mildew. The might be mold in the house. There is no edema   Subjective A little better Some reflux VITAL SIGNS: BP 122/76 (BP Location: Left Arm)   Pulse 62   Temp 97.6 F (36.4 C) (Oral)   Resp 18   Ht 6\' 3"  (1.905 m)   Wt 204 lb 8 oz (92.8 kg)   SpO2 91%   BMI 25.56 kg/m  4 liters  HEMODYNAMICS:    VENTILATOR SETTINGS:    INTAKE / OUTPUT: I/O last 3 completed shifts: In: 1130 [P.O.:480; IV Piggyback:650] Out: 0258 [Urine:1175]  PHYSICAL EXAMINATION: General:  Up in bed. Does have SOB w exertion  Neuro:  No focal def HEENT:  NCNT no JVD Cardiovascular:RRR no MRG Lungs:  Posterior rales. No accessory use at rest Abdomen:  Soft NT no OM  Musculoskeletal:no edema, equal st  Skin:brisk CR LABS:  BMET  Recent Labs Lab 11/18/2016 1811 11/26/16 0314 11/27/16 0559  NA 137 138 141  K 3.2* 3.3* 4.0  CL 99* 103 104  CO2 27 27 26   BUN 12 13 21*  CREATININE 1.08 1.05 1.00  GLUCOSE 127* 138* 276*    Electrolytes  Recent Labs Lab 11/22/2016 1811 11/26/16 0314 11/27/16 0559  CALCIUM 8.9 8.8* 9.2  MG  --   --  1.8    CBC  Recent Labs Lab 12/05/2016 1934 11/26/16 0314 11/27/16 0559  WBC 8.9 6.9 9.4  HGB 13.4 11.8* 11.8*  HCT 38.7* 34.3* 34.1*  PLT 165 146* 147*    Coag's  No results for input(s): APTT, INR in the last 168 hours.  Sepsis Markers  Recent Labs Lab 11/26/16 0003 11/26/16 0314  LATICACIDVEN 1.2 0.7  PROCALCITON <0.10 0.15    ABG No results for input(s): PHART, PCO2ART, PO2ART in the last 168 hours.  Liver Enzymes  Recent Labs Lab 11/29/2016 1811  AST 18  ALT 13*  ALKPHOS 101  BILITOT 0.8  ALBUMIN 3.2*    Cardiac Enzymes  Recent Labs Lab 11/26/16 1330 11/27/16 0559  TROPONINI <0.03 <0.03    Glucose  Recent Labs Lab 11/26/16 1656 11/26/16 2153 11/27/16 0748  GLUCAP 178* 273* 278*    Imaging No results  found.   ASSESSMENT / PLAN: Acute hypoxemic respiratory failure with bilateral diffuse pulmonary infiltrates Differential diagnosis 1. Undiagnosed baseline IPF with IPF flare: Family history in time sequence can fit in with this particularly with surgery in July 2018 being a risk factor. Around less than 5-10% of IPF is diagnosed like this 2. Methotrexate related acute hypersensitivity pneumonitis: This is rare but I personally have seen f4  Patient's developed this in the last 10 years and all of them were in the setting of injection methotrexate. And all these cases it happened shortly after commencing injection methotrexate 3. HSP from another exposure > mold exposure 4. Idiopathic Boop 5. Acute lung injury idiopathic 6. Autoimmune or other vasculitis process in setting psoriatic arthritis 7. Opportunistic infection because of methotrexate: The patient has not been on chronic prednisone and therefore the odds of this is extremely low   ILD workup: CK: 15 Legionella U antigen>>> Aldolase >>> Anti-scleroderma antibody>>> Sjogrens syndrome B>>> Sjogrens syndrome A >>> GBM>>> Mpo/pr-3 (anca)>> ANCA titers>>> ANA w/reflex >>> Anti-DNA antibody>>> ACE>>> Sed rate: 84 HIV: NR RVP: not detected  ECHO:   Plan F/u autoimmune eval  Cont high dose steroids  Hold MTX Wean o2 as able Add PPI  Brandon Schroeder ACNP-BC [place] Pager # [phone] OR # [phone] if no answer   Attending Note:  I have examined patient, reviewed labs, studies and notes. I have discussed the case with Brandon Schroeder, and I agree with the data and plans as amended above. 71 year old man with a family history of idiopathic pulmonary fibrosis, followed by Brandon Schroeder for psoriatic arthritis, treated with methotrexate. Has had his methotrexate up titrated over the last several weeks due to right knee arthralgias and suspected pseudogout inflammation. Admitted with dyspnea in the setting of  diffuse bilateral alveolar and interstitial infiltrates with patchy appearance, basilar groundglass. As above he also has risk factors for hypersensitivity pneumonitis with a mold exposure in his home. Differential diagnosis includes this,methotrexate related lung injury, autoimmune mediated interstitial lung disease due to his psoriatic arthritis, IPF which would be a diagnosis of exclusion. Methotrexate has been stopped. He is now on high-dose steroids and appears to be clinically improved. I don't believe there is any benefit to transbronchial biopsies to assess for hypersensitivity pneumonitis at this point since he is been on steroids. I would favor continuing his current therapy with pulsed steroids and then transitioning to maintenance steroids for several weeks. Follow his clinical status and his imaging. I would not restart his methotrexate given the possibility that this was an offending agent. He may ultimately merit bronchoscopy depending on improvement. He may also ultimately require VATS lung biopsy depending on whether we get a diagnosis from his current workup. Autoimmune labs are pending.   Baltazar Apo, MD, PhD 11/27/2016, 1:38 PM [facility]  [phone] or if no answer [phone]

## 2016-11-27 NOTE — Progress Notes (Signed)
CPAP set up and patient placed on auto (min3 max 15) per home settings via [facility] with 3 lpm O2 bleed in.  Tolerating well at this time. Encouraged patient to call for RT if any issues.

## 2016-11-28 LAB — GLUCOSE, CAPILLARY
GLUCOSE-CAPILLARY: 303 mg/dL — AB (ref 65–99)
GLUCOSE-CAPILLARY: 318 mg/dL — AB (ref 65–99)
Glucose-Capillary: 354 mg/dL — ABNORMAL HIGH (ref 65–99)
Glucose-Capillary: 397 mg/dL — ABNORMAL HIGH (ref 65–99)

## 2016-11-28 LAB — BASIC METABOLIC PANEL
Anion gap: 8 (ref 5–15)
BUN: 28 mg/dL — ABNORMAL HIGH (ref 6–20)
CALCIUM: 9.2 mg/dL (ref 8.9–10.3)
CHLORIDE: 105 mmol/L (ref 101–111)
CO2: 26 mmol/L (ref 22–32)
CREATININE: 1.09 mg/dL (ref 0.61–1.24)
GFR calc non Af Amer: >60 mL/min (ref >60)
GLUCOSE: 305 mg/dL — AB (ref 65–99)
Potassium: 4.5 mmol/L (ref 3.5–5.1)
Sodium: 139 mmol/L (ref 135–145)

## 2016-11-28 LAB — LEGIONELLA PNEUMOPHILA SEROGP 1 UR AG
L. PNEUMOPHILA SEROGP 1 UR AG: NEGATIVE
L. pneumophila Serogp 1 Ur Ag: NEGATIVE

## 2016-11-28 LAB — CBC
HEMATOCRIT: 34 % — AB (ref 39.0–52.0)
Hemoglobin: 11.8 g/dL — ABNORMAL LOW (ref 13.0–17.0)
MCH: 31.8 pg (ref 26.0–34.0)
MCHC: 34.7 g/dL (ref 30.0–36.0)
MCV: 91.6 fL (ref 78.0–100.0)
Platelets: 164 10*3/uL (ref 150–400)
RBC: 3.71 MIL/uL — ABNORMAL LOW (ref 4.22–5.81)
RDW: 13.2 % (ref 11.5–15.5)
WBC: 16.3 10*3/uL — ABNORMAL HIGH (ref 4.0–10.5)

## 2016-11-28 NOTE — Progress Notes (Signed)
Call to patients room to switch from hospital CPAP to home CPAP.  Pt. States "he's just not comfortable on our equipment and he isn't going to be able to sleep."    Assisted pt with home unit, placed on pt and bled in 4 lpm O2 ( per what pt is already on)  P

## 2016-11-28 NOTE — Progress Notes (Signed)
PROGRESS NOTE  [name].  XFG:[id number] DOB: [birth year] DOA: 12/13/2016  PCP: Laurey Morale, MD   Brief Narrative:  Patient is 71 year old male with known history of hypertension, depression, presented to emergency department with main concern of several weeks to months duration of progressively worsening dry cough and dyspnea. Patient reports dyspnea was initially present only with exertion but over the past several weeks he has noted significant dyspnea at rest. Patient also reports associated intermittent low-grade fevers and was recently treated with steroids for possible costochondritis followed by Ceftin for presumptive pneumonia that was seen on CT scan. Patient reports compliance with medications but his symptoms have not improved. He was admitted to [facility] for further evaluation.  On arrival to emergency department patient was hypoxic with oxygen saturation 88% on room air and worsening drop with ambulation. CT chest revealed peribronchial vascular opacity, consolidation, questionable background of ILD.ulmonary team was consulted, started steroids and empiric antibiotics.   Assessment & Plan:   Acute hypoxemic respiratory failure with bilateral diffuse pulmonary infiltrates - Etiology remains unclear, ? IPF especially given family history ( patient's father died of IPF), BOOP, Autoimmune versus other vasculitis - ? Methotrexate related acute hypersensitivity pneumonitis (pt on Methotrexate for psoriatic arthritis)  - I have reviewed imaging studies with patient at bedside - Current workup including following still pending:  Legionella urinary antigen  Aldolase  Anti scleroderma antibody  Sjogren A  Sjogren B  GBM  MPO/PR-3  ANCA titers  ANA  anti-DNA antibody  ECHO - follow up on tests above  - cont vanc and cefepime for now, day #3, narrow down to PO regimen if PCCM agrees   Sleep apnea:  - Continue CPAP qHS  HTN - Continue norvasc -  overall stable   Anxiety/depression - Continue zoloft  Hypokalemia - supplemented and WNL  DVT prophylaxis: Lovenox SQ Code Status: Full, confirmed. Family Communication: Pt at bedside  Disposition Plan: home once medically stable   Consultants:   Pulmonology, Dr. Chase Caller  Procedures:   None  Antimicrobials:  Vancomycin, cefepime 10/10 >   Subjective: Still with exertional dyspnea.   Objective: BP 125/77 (BP Location: Right Arm)   Pulse 63   Temp (!) 97.5 F (36.4 C) (Oral)   Resp 18   Ht 6\' 3"  (1.905 m)   Wt 92.8 kg (204 lb 8 oz)   SpO2 96%   BMI 25.56 kg/m    Physical Exam  Constitutional: Appears calm, NAD CVS: RRR, S1/S2 +, no murmurs, no gallops, no carotid bruit.  Pulmonary: Effort and breath sounds normal, rales at bases  Abdominal: Soft. BS +,  no distension, tenderness, rebound or guarding.  Musculoskeletal: Normal range of motion. No edema and no tenderness.  Neuro: Alert. Normal reflexes, muscle tone coordination. No cranial nerve deficit.  Data Reviewed: I have personally reviewed following labs and imaging studies  CBC:  Recent Labs Lab 12/10/2016 1934 11/26/16 0314 11/27/16 0559 11/28/16 0500  WBC 8.9 6.9 9.4 16.3*  NEUTROABS 6.4 4.7  --   --   HGB 13.4 11.8* 11.8* 11.8*  HCT 38.7* 34.3* 34.1* 34.0*  MCV 93.7 93.5 92.9 91.6  PLT 165 146* 147* 967   Basic Metabolic Panel:  Recent Labs Lab 11/22/2016 1811 11/26/16 0314 11/27/16 0559 11/28/16 0500  NA 137 138 141 139  K 3.2* 3.3* 4.0 4.5  CL 99* 103 104 105  CO2 27 27 26 26   GLUCOSE 127* 138* 276* 305*  BUN 12 13 21* 28*  CREATININE 1.08 1.05 1.00 1.09  CALCIUM 8.9 8.8* 9.2 9.2  MG  --   --  1.8  --    Liver Function Tests:  Recent Labs Lab 11/30/2016 1811  AST 18  ALT 13*  ALKPHOS 101  BILITOT 0.8  PROT 6.6  ALBUMIN 3.2*   Cardiac Enzymes:  Recent Labs Lab 11/26/16 1330 11/26/16 1333 11/27/16 0559  CKTOTAL  --  15*  --   TROPONINI <0.03  --  <0.03    CBG:  Recent Labs Lab 11/27/16 1155 11/27/16 1716 11/27/16 2115 11/28/16 0712 11/28/16 1134  GLUCAP 322* 228* 299* 354* 303*   Urine analysis:    Component Value Date/Time   COLORURINE YELLOW 08/16/2016 1305   APPEARANCEUR CLEAR 08/16/2016 1305   LABSPEC 1.016 08/16/2016 1305   PHURINE 6.0 08/16/2016 1305   GLUCOSEU >=500 (A) 08/16/2016 1305   HGBUR MODERATE (A) 08/16/2016 1305   BILIRUBINUR NEGATIVE 08/16/2016 1305   BILIRUBINUR n 02/19/2016 1407   KETONESUR 20 (A) 08/16/2016 1305   PROTEINUR 30 (A) 08/16/2016 1305   UROBILINOGEN 0.2 02/19/2016 1407   NITRITE NEGATIVE 08/16/2016 1305   LEUKOCYTESUR NEGATIVE 08/16/2016 1305   Recent Results (from the past 240 hour(s))  Culture, blood (Routine X 2) w Reflex to ID Panel     Status: None (Preliminary result)   Collection Time: 11/21/2016  8:54 PM  Result Value Ref Range Status   Specimen Description BLOOD RIGHT HAND  Final   Special Requests   Final    IN PEDIATRIC BOTTLE Blood Culture results may not be optimal due to an excessive volume of blood received in culture bottles   Culture   Final    NO GROWTH 2 DAYS Performed at [facility], [place] [address] [place], [place] [address]    Report Status PENDING  Incomplete  Culture, blood (Routine X 2) w Reflex to ID Panel     Status: None (Preliminary result)   Collection Time: 12/03/2016  8:54 PM  Result Value Ref Range Status   Specimen Description BLOOD RIGHT ANTECUBITAL  Final   Special Requests   Final    BOTTLES DRAWN AEROBIC AND ANAEROBIC Blood Culture adequate volume   Culture   Final    NO GROWTH 2 DAYS Performed at [facility], [place] [address], [place], [place] [address]    Report Status PENDING  Incomplete  Respiratory Panel by PCR     Status: None   Collection Time: 11/26/16 12:46 PM  Result Value Ref Range Status   Adenovirus NOT DETECTED NOT DETECTED Final   Coronavirus 229E NOT DETECTED NOT DETECTED Final   Coronavirus HKU1 NOT DETECTED NOT  DETECTED Final   Coronavirus NL63 NOT DETECTED NOT DETECTED Final   Coronavirus OC43 NOT DETECTED NOT DETECTED Final   Metapneumovirus NOT DETECTED NOT DETECTED Final   Rhinovirus / Enterovirus NOT DETECTED NOT DETECTED Final   Influenza A NOT DETECTED NOT DETECTED Final   Influenza B NOT DETECTED NOT DETECTED Final   Parainfluenza Virus 1 NOT DETECTED NOT DETECTED Final   Parainfluenza Virus 2 NOT DETECTED NOT DETECTED Final   Parainfluenza Virus 3 NOT DETECTED NOT DETECTED Final   Parainfluenza Virus 4 NOT DETECTED NOT DETECTED Final   Respiratory Syncytial Virus NOT DETECTED NOT DETECTED Final   Bordetella pertussis NOT DETECTED NOT DETECTED Final   Chlamydophila pneumoniae NOT DETECTED NOT DETECTED Final   Mycoplasma pneumoniae NOT DETECTED NOT DETECTED Final    Comment: Performed at [facility], [place] [address], [place], [place] [address]     Radiology Studies: No results found.  Scheduled Meds: . amLODipine  10 mg Oral Daily  . aspirin EC  81 mg Oral Daily  . enoxaparin (LOVENOX) injection  40 mg Subcutaneous QHS  . feeding supplement  1 Container Oral BID WC  . feeding supplement (ENSURE ENLIVE)  237 mL Oral BID BM  . fluticasone  2 spray Each Nare Daily  . insulin aspart  0-15 Units Subcutaneous TID WC  . methylPREDNISolone (SOLU-MEDROL) injection  125 mg Intravenous Q6H  . multivitamin with minerals  1 tablet Oral Daily  . pantoprazole  40 mg Oral Daily  . sertraline  100 mg Oral BID  . tamsulosin  0.4 mg Oral QPC breakfast   Continuous Infusions: . ceFEPime (MAXIPIME) IV Stopped (11/28/16 0557)  . vancomycin Stopped (11/28/16 1155)     LOS: 3 days   Time spent: 25 minutes.  Faye Ramsay, MD [facility] Pager [phone]  If 7PM-7AM, please contact night-coverage [email or web address] Password [email or web address] 11/28/2016, 2:44 PM

## 2016-11-28 NOTE — Progress Notes (Signed)
Patient does not want to wear CPAP tonight. 

## 2016-11-28 NOTE — Progress Notes (Signed)
Pharmacy Antibiotic Note  Brandon Schroeder. is a 71 y.o. male admitted on 12/09/2016 with acute respiratory failure with possible pneumonia.  ILD workup undergoing per PCCM.  Pharmacy has been consulted for vancomycin dosing.  Day #3 Vancomycin 1250 mg IV q12h and Cefepime 1g IV q8h.  Renal function stable. Cultures and infectious work up negative to date.  Plan: Continue Vancomycin 1250 mg IV q12h.  Check trough level tomorrow morning.  Could consider checking MRSA nasal screen to rule out MRSA pneumonia.  F/u PCCM recommendations for narrowing antibiotics.  Height: 6\' 3"  (190.5 cm) Weight: 204 lb 8 oz (92.8 kg) IBW/kg (Calculated) : 84.5  Temp (24hrs), Avg:97.5 F (36.4 C), Min:97.5 F (36.4 C), Max:97.5 F (36.4 C)   Recent Labs Lab 11/22/2016 1811 11/16/2016 1934 11/26/16 0003 11/26/16 0314 11/27/16 0559 11/28/16 0500  WBC  --  8.9  --  6.9 9.4 16.3*  CREATININE 1.08  --   --  1.05 1.00 1.09  LATICACIDVEN  --   --  1.2 0.7  --   --     Estimated Creatinine Clearance: 75.4 mL/min (by C-G formula based on SCr of 1.09 mg/dL).    Allergies  Allergen Reactions  . Bee Venom Anaphylaxis  . Levofloxacin Itching  . Penicillins Hives    ++ for ceftriaxone in the past++ Has patient had a PCN reaction causing immediate rash, facial/tongue/throat swelling, SOB or lightheadedness with hypotension: unknown Has patient had a PCN reaction causing severe rash involving mucus membranes or skin necrosis: unknown Has patient had a PCN reaction that required hospitalization No Has patient had a PCN reaction occurring within the last 10 years: No If all of the above answers are "NO", then may proceed with Cephalosporin use.   . Trazodone And Nefazodone Swelling    Throat swells    Antimicrobials this admission:  Vancomycin 12/11/2016 >> Cefepime 11/20/2016 >>  Dose adjustments this admission:  10/13 VT =  Microbiology results:  10/10 BCx: NGTD 10/10 Strep pneumo antigen :  negative 10/11 HIV antibody: negative 10/11 Legionella antigen : sent 10/11 Respiratory PCR: negative  Thank you for allowing pharmacy to be a part of this patient's care.  Hershal Coria 11/28/2016 10:52 AM

## 2016-11-29 LAB — BASIC METABOLIC PANEL
Anion gap: 6 (ref 5–15)
BUN: 35 mg/dL — ABNORMAL HIGH (ref 6–20)
CHLORIDE: 106 mmol/L (ref 101–111)
CO2: 27 mmol/L (ref 22–32)
CREATININE: 1.11 mg/dL (ref 0.61–1.24)
Calcium: 8.8 mg/dL — ABNORMAL LOW (ref 8.9–10.3)
GFR calc non Af Amer: >60 mL/min (ref >60)
Glucose, Bld: 347 mg/dL — ABNORMAL HIGH (ref 65–99)
Potassium: 4.3 mmol/L (ref 3.5–5.1)
Sodium: 139 mmol/L (ref 135–145)

## 2016-11-29 LAB — CBC
HEMATOCRIT: 33.6 % — AB (ref 39.0–52.0)
HEMOGLOBIN: 11.5 g/dL — AB (ref 13.0–17.0)
MCH: 31.3 pg (ref 26.0–34.0)
MCHC: 34.2 g/dL (ref 30.0–36.0)
MCV: 91.3 fL (ref 78.0–100.0)
Platelets: 153 10*3/uL (ref 150–400)
RBC: 3.68 MIL/uL — ABNORMAL LOW (ref 4.22–5.81)
RDW: 13.3 % (ref 11.5–15.5)
WBC: 14.9 10*3/uL — ABNORMAL HIGH (ref 4.0–10.5)

## 2016-11-29 LAB — GLUCOSE, CAPILLARY
GLUCOSE-CAPILLARY: 300 mg/dL — AB (ref 65–99)
GLUCOSE-CAPILLARY: 369 mg/dL — AB (ref 65–99)
GLUCOSE-CAPILLARY: 398 mg/dL — AB (ref 65–99)
GLUCOSE-CAPILLARY: 404 mg/dL — AB (ref 65–99)

## 2016-11-29 LAB — VANCOMYCIN, TROUGH: Vancomycin Tr: 23 ug/mL (ref 15–20)

## 2016-11-29 MED ORDER — METHYLPREDNISOLONE SODIUM SUCC 125 MG IJ SOLR
60.0000 mg | Freq: Every day | INTRAMUSCULAR | Status: DC
Start: 2016-11-30 — End: 2016-12-01
  Administered 2016-11-30 – 2016-12-01 (×2): 60 mg via INTRAVENOUS
  Filled 2016-11-29 (×2): qty 2

## 2016-11-29 MED ORDER — OXYCODONE-ACETAMINOPHEN 5-325 MG PO TABS
1.0000 | ORAL_TABLET | ORAL | Status: DC | PRN
  Administered 2016-11-29 – 2016-12-03 (×12): 2 via ORAL
  Filled 2016-11-29 (×2): qty 1
  Filled 2016-11-29 (×2): qty 2
  Filled 2016-11-29: qty 1
  Filled 2016-11-29 (×9): qty 2

## 2016-11-29 MED ORDER — DOXYCYCLINE HYCLATE 100 MG PO TABS
100.0000 mg | ORAL_TABLET | Freq: Two times a day (BID) | ORAL | Status: DC
  Administered 2016-11-29 – 2016-12-01 (×5): 100 mg via ORAL
  Filled 2016-11-29 (×5): qty 1

## 2016-11-29 NOTE — Progress Notes (Signed)
PULMONARY / CRITICAL CARE MEDICINE   Name: Brandon Schroeder. MRN: [id number] DOB: 1945/04/21    ADMISSION DATE:  12/08/2016 CONSULTATION DATE: [place] Circle   REFERRING MD:  Dr Vance Gather [facility]   CHIEF COMPLAINT:  Concern for ILD flare.  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 His wife is retired Technical sales engineer at [employer] and ran the clinical trials office at [facility] in [place]. Consult has been now been requested for concern for acute flareup of interstitial lung disease  He tells me that his father died in the late 69s in [place] at [facility] from acute ventilator-dependent respiratory failure following a diagnosis of IPF. Patient himself has never had any respiratory complaints. He leads an active life but is not overtly athletic. For these activities he has never noticed shortness of breath up until 5 or 6 months ago. Wife says this coincides with starting oral methotrexate for psoriatic arthritis. Then approximately in August 2018 after gallbladder surgery the oral methotrexate was switched to subcutaneous methotrexate injection. Some 2 weeks ago the dose was increased. He follows for psoriatic arthritis with Dr. Leigh Aurora. Coinciding with this in the last 4 weeks his dyspnea and cough started worsening along with feelings of subjective fever low grade intermittently. Dyspnea is progressed to class III-IV levels. In the emergency room department yesterday was saturating 88% on room air. Since admission he's been on 4 L nasal cannula. He has been afebrile and the sepsis biomarkers pro calcitonin is relatively benign. His lactic acid is normal. White count is normal urine streptococcus is negative all suggesting he does not have a bacterial process. He does not endorse any acute abrupt  decline. He also denies any autoimmune disease. He denies any birds in the house of feathered pillows all mildew. The might be mold in the house. There is no edema   Subjective States that he isn't sure he feels any better, notes band-like chest pain across his upper chest Didn't wear CPAp last night - too nervous O2 at 4L/min   VITAL SIGNS: BP 126/76 (BP Location: Right Arm)   Pulse (!) 58   Temp 97.6 F (36.4 C) (Oral)   Resp 19   Ht 6\' 3"  (1.905 m)   Wt 92.8 kg (204 lb 8 oz)   SpO2 95%   BMI 25.56 kg/m   HEMODYNAMICS:    VENTILATOR SETTINGS:    INTAKE / OUTPUT: I/O last 3 completed shifts: In: 590 [P.O.:240; IV Piggyback:350] Out: 1050 [Urine:1050]  PHYSICAL EXAMINATION: General:  Comfortable in bed Neuro: non-focal HEENT:  NCNT no JVD Cardiovascular: regular, no M Lungs:  bilateral insp crackles.  Abdomen:  Soft NT no OM  Musculoskeletal: no edema Skin: no rash LABS:  BMET  Recent Labs Lab 11/27/16 0559 11/28/16 0500 11/29/16 0610  NA 141 139 139  K 4.0 4.5 4.3  CL 104 105 106  CO2 26 26 27   BUN 21* 28* 35*  CREATININE 1.00 1.09 1.11  GLUCOSE 276* 305* 347*    Electrolytes  Recent Labs Lab 11/27/16 0559 11/28/16 0500 11/29/16 0610  CALCIUM 9.2 9.2 8.8*  MG 1.8  --   --     CBC  Recent Labs Lab 11/27/16 0559 11/28/16 0500 11/29/16 0610  WBC 9.4 16.3* 14.9*  HGB 11.8* 11.8* 11.5*  HCT  34.1* 34.0* 33.6*  PLT 147* 164 153    Coag's No results for input(s): APTT, INR in the last 168 hours.  Sepsis Markers  Recent Labs Lab 11/26/16 0003 11/26/16 0314  LATICACIDVEN 1.2 0.7  PROCALCITON <0.10 0.15    ABG No results for input(s): PHART, PCO2ART, PO2ART in the last 168 hours.  Liver Enzymes  Recent Labs Lab 11/18/2016 1811  AST 18  ALT 13*  ALKPHOS 101  BILITOT 0.8  ALBUMIN 3.2*    Cardiac Enzymes  Recent Labs Lab 11/26/16 1330 11/27/16 0559  TROPONINI <0.03 <0.03    Glucose  Recent Labs Lab  11/27/16 2115 11/28/16 0712 11/28/16 1134 11/28/16 1624 11/28/16 2007 11/29/16 0736  GLUCAP 299* 354* 303* 318* 397* 300*    Imaging No results found.  Discussion:  71 year old man with a family history of idiopathic pulmonary fibrosis, followed by Dr. Amil Amen for psoriatic arthritis, treated with methotrexate. Has had his methotrexate up titrated over the last several weeks due to right knee arthralgias and ? pseudogouty inflammation. Admitted with dyspnea in the setting of diffuse bilateral alveolar and interstitial infiltrates with patchy appearance, basilar groundglass. He also has risk factors for hypersensitivity pneumonitis with a mold exposure in his home. Differential diagnosis includes this,methotrexate related lung injury, autoimmune mediated interstitial lung disease due to his psoriatic arthritis, IPF which would be a diagnosis of exclusion. Methotrexate has been stopped. He is now on high-dose steroids. Doubt there is any benefit to transbronchial biopsies to assess for hypersensitivity pneumonitis at this point since he is been on steroids, but BAL for atypical organisms may be helpful. [name] continuing his current therapy with pulsed steroids and then transitioning to maintenance steroids for several weeks. Follow his clinical status and his imaging. I would not restart his methotrexate given the possibility that this was an offending agent. He may ultimately merit bronchoscopy depending on improvement. He may also ultimately require VATS lung biopsy.  ASSESSMENT / PLAN: Acute hypoxemic respiratory failure with bilateral diffuse pulmonary infiltrates Differential diagnosis 1. Undiagnosed baseline IPF with IPF flare: Family history in time sequence can fit in with this particularly with surgery in July 2018 being a risk factor. Around less than 5-10% of IPF is diagnosed like this 2. Methotrexate related acute hypersensitivity pneumonitis: This is rare but I personally have seen f4   Patient's developed this in the last 10 years and all of them were in the setting of injection methotrexate. And all these cases it happened shortly after commencing injection methotrexate 3. HSP from another exposure > mold exposure 4. Idiopathic Boop 5. Acute lung injury idiopathic 6. Autoimmune or other vasculitis process in setting psoriatic arthritis 7. Opportunistic infection because of methotrexate: The patient has not been on chronic prednisone and therefore the odds of this is extremely low   ILD workup: CK: 15 Legionella U antigen>>> negative Aldolase >>> normal Anti-scleroderma antibody>>> negative Sjogrens syndrome B>>> negative Sjogrens syndrome A >>> negative GBM>>> normal Mpo/pr-3 (anca)>> negative  ANCA titers>>> negative ANA w/reflex >>> negative Anti-DNA antibody>>> negative ACE>>> 56 Sed rate: 84 HIV: NR RVP: not detected  ECHO:   Plan Agree with changing abx to PO, consider doxycycline given his allergy profile Cont high dose steroids through today, then convert to 60mg  qd starting 10/15 Hold MTX, likely never to restart  Consider FOB with BAL to eval for atypical organisms depending on course Wean o2 as able Continue PPI He requests better pain control for his upper chest, I will defer this to IM  Mobilize if at all possible.  CPAP qhs Repeat PA/lateral CXR 10/15      Baltazar Apo, MD, PhD 11/29/2016, 8:21 AM [facility] [phone] or if no answer [phone]

## 2016-11-29 NOTE — Progress Notes (Signed)
PROGRESS NOTE  Brandon Schroeder.  FIE:[id number] DOB: [birth year] DOA: 11/30/2016  PCP: Laurey Morale, MD   Brief Narrative:  Patient is 71 year old male with known history of hypertension, depression, presented to emergency department with main concern of several weeks to months duration of progressively worsening dry cough and dyspnea. Patient reports dyspnea was initially present only with exertion but over the past several weeks he has noted significant dyspnea at rest. Patient also reports associated intermittent low-grade fevers and was recently treated with steroids for possible costochondritis followed by Ceftin for presumptive pneumonia that was seen on CT scan. Patient reports compliance with medications but his symptoms have not improved. He was admitted to [facility] for further evaluation.  On arrival to emergency department patient was hypoxic with oxygen saturation 88% on room air and worsening drop with ambulation. CT chest revealed peribronchial vascular opacity, consolidation, questionable background of ILD.ulmonary team was consulted, started steroids and empiric antibiotics.   Assessment & Plan:   Acute hypoxemic respiratory failure with bilateral diffuse pulmonary infiltrates - Etiology remains unclear, ? IPF especially given family history ( patient's father died of IPF), BOOP, Autoimmune versus other vasculitis - ? Methotrexate related acute hypersensitivity pneumonitis (pt on Methotrexate for psoriatic arthritis)  - I have reviewed imaging studies with patient at bedside - Current workup including following still pending:  Legionella urinary antigen -neg  Aldolase -normal  Anti scleroderma antibody - neg  Sjogren A - neg  Sjogren B - neg  GBM - normal  MPO/PR-3 - neg  ANCA titers - neg  ANA - negative   anti-DNA antibody  ECHO - follow up on tests above  - stop vanc and cefepime, change to doxy   Sleep apnea:  - Continue CPAP qHS  HTN -  Continue norvasc - overall stable   Anxiety/depression - Continue zoloft  Hypokalemia - supplemented   DVT prophylaxis: Lovenox SQ Code Status: Full, confirmed. Family Communication: Pt at bedside  Disposition Plan: home in 1-2 days   Consultants:   Pulmonology, Dr. Chase Caller  Procedures:   None  Antimicrobials:  Vancomycin, cefepime 10/10 >   Subjective: Ovearll better.   Objective: BP 128/77 (BP Location: Left Arm)   Pulse 65   Temp 97.7 F (36.5 C) (Oral)   Resp 18   Ht 6\' 3"  (1.905 m)   Wt 92.8 kg (204 lb 8 oz)   SpO2 91%   BMI 25.56 kg/m    Physical Exam  Constitutional: Appears calm, NAD CVS: RRR, S1/S2 +, no murmurs, no gallops, no carotid bruit.  Pulmonary: Effort and breath sounds normal, no stridor Abdominal: Soft. BS +,  no distension, tenderness, rebound or guarding.   Data Reviewed: I have personally reviewed following labs and imaging studies  CBC:  Recent Labs Lab 11/20/2016 1934 11/26/16 0314 11/27/16 0559 11/28/16 0500 11/29/16 0610  WBC 8.9 6.9 9.4 16.3* 14.9*  NEUTROABS 6.4 4.7  --   --   --   HGB 13.4 11.8* 11.8* 11.8* 11.5*  HCT 38.7* 34.3* 34.1* 34.0* 33.6*  MCV 93.7 93.5 92.9 91.6 91.3  PLT 165 146* 147* 164 166   Basic Metabolic Panel:  Recent Labs Lab 12/06/2016 1811 11/26/16 0314 11/27/16 0559 11/28/16 0500 11/29/16 0610  NA 137 138 141 139 139  K 3.2* 3.3* 4.0 4.5 4.3  CL 99* 103 104 105 106  CO2 27 27 26 26 27   GLUCOSE 127* 138* 276* 305* 347*  BUN 12 13 21* 28* 35*  CREATININE  1.08 1.05 1.00 1.09 1.11  CALCIUM 8.9 8.8* 9.2 9.2 8.8*  MG  --   --  1.8  --   --    Liver Function Tests:  Recent Labs Lab 11/19/2016 1811  AST 18  ALT 13*  ALKPHOS 101  BILITOT 0.8  PROT 6.6  ALBUMIN 3.2*   Cardiac Enzymes:  Recent Labs Lab 11/26/16 1330 11/26/16 1333 11/27/16 0559  CKTOTAL  --  15*  --   TROPONINI <0.03  --  <0.03   CBG:  Recent Labs Lab 11/28/16 1134 11/28/16 1624 11/28/16 2007  11/29/16 0736 11/29/16 1139  GLUCAP 303* 318* 397* 300* 369*   Urine analysis:    Component Value Date/Time   COLORURINE YELLOW 08/16/2016 1305   APPEARANCEUR CLEAR 08/16/2016 1305   LABSPEC 1.016 08/16/2016 1305   PHURINE 6.0 08/16/2016 1305   GLUCOSEU >=500 (A) 08/16/2016 1305   HGBUR MODERATE (A) 08/16/2016 1305   BILIRUBINUR NEGATIVE 08/16/2016 1305   BILIRUBINUR n 02/19/2016 1407   KETONESUR 20 (A) 08/16/2016 1305   PROTEINUR 30 (A) 08/16/2016 1305   UROBILINOGEN 0.2 02/19/2016 1407   NITRITE NEGATIVE 08/16/2016 1305   LEUKOCYTESUR NEGATIVE 08/16/2016 1305   Recent Results (from the past 240 hour(s))  Culture, blood (Routine X 2) w Reflex to ID Panel     Status: None (Preliminary result)   Collection Time: 12/14/2016  8:54 PM  Result Value Ref Range Status   Specimen Description BLOOD RIGHT HAND  Final   Special Requests   Final    IN PEDIATRIC BOTTLE Blood Culture results may not be optimal due to an excessive volume of blood received in culture bottles   Culture   Final    NO GROWTH 3 DAYS Performed at [facility], [place] [address] [place], [place] [address]    Report Status PENDING  Incomplete  Culture, blood (Routine X 2) w Reflex to ID Panel     Status: None (Preliminary result)   Collection Time: 11/17/2016  8:54 PM  Result Value Ref Range Status   Specimen Description BLOOD RIGHT ANTECUBITAL  Final   Special Requests   Final    BOTTLES DRAWN AEROBIC AND ANAEROBIC Blood Culture adequate volume   Culture   Final    NO GROWTH 3 DAYS Performed at [facility], [place] [address], [place], [place] [address]    Report Status PENDING  Incomplete  Respiratory Panel by PCR     Status: None   Collection Time: 11/26/16 12:46 PM  Result Value Ref Range Status   Adenovirus NOT DETECTED NOT DETECTED Final   Coronavirus 229E NOT DETECTED NOT DETECTED Final   Coronavirus HKU1 NOT DETECTED NOT DETECTED Final   Coronavirus NL63 NOT DETECTED NOT DETECTED Final    Coronavirus OC43 NOT DETECTED NOT DETECTED Final   Metapneumovirus NOT DETECTED NOT DETECTED Final   Rhinovirus / Enterovirus NOT DETECTED NOT DETECTED Final   Influenza A NOT DETECTED NOT DETECTED Final   Influenza B NOT DETECTED NOT DETECTED Final   Parainfluenza Virus 1 NOT DETECTED NOT DETECTED Final   Parainfluenza Virus 2 NOT DETECTED NOT DETECTED Final   Parainfluenza Virus 3 NOT DETECTED NOT DETECTED Final   Parainfluenza Virus 4 NOT DETECTED NOT DETECTED Final   Respiratory Syncytial Virus NOT DETECTED NOT DETECTED Final   Bordetella pertussis NOT DETECTED NOT DETECTED Final   Chlamydophila pneumoniae NOT DETECTED NOT DETECTED Final   Mycoplasma pneumoniae NOT DETECTED NOT DETECTED Final    Comment:  Performed at [facility], [place] [address], [place], [place] [address]     Radiology Studies: No results found.  Scheduled Meds: . amLODipine  10 mg Oral Daily  . aspirin EC  81 mg Oral Daily  . doxycycline  100 mg Oral Q12H  . enoxaparin (LOVENOX) injection  40 mg Subcutaneous QHS  . feeding supplement  1 Container Oral BID WC  . feeding supplement (ENSURE ENLIVE)  237 mL Oral BID BM  . fluticasone  2 spray Each Nare Daily  . insulin aspart  0-15 Units Subcutaneous TID WC  . methylPREDNISolone (SOLU-MEDROL) injection  125 mg Intravenous Q6H  . [START ON 11/30/2016] methylPREDNISolone (SOLU-MEDROL) injection  60 mg Intravenous Daily  . multivitamin with minerals  1 tablet Oral Daily  . pantoprazole  40 mg Oral Daily  . sertraline  100 mg Oral BID  . tamsulosin  0.4 mg Oral QPC breakfast   Continuous Infusions:    LOS: 4 days   Time spent: 25 minutes.  Faye Ramsay, MD [facility] Pager [phone]  If 7PM-7AM, please contact night-coverage [email or web address] Password [email or web address] 11/29/2016, 2:01 PM

## 2016-11-30 ENCOUNTER — Inpatient Hospital Stay (HOSPITAL_COMMUNITY): Payer: PPO

## 2016-11-30 DIAGNOSIS — J189 Pneumonia, unspecified organism: Principal | ICD-10-CM

## 2016-11-30 DIAGNOSIS — J84112 Idiopathic pulmonary fibrosis: Secondary | ICD-10-CM

## 2016-11-30 LAB — GLUCOSE, CAPILLARY
GLUCOSE-CAPILLARY: 320 mg/dL — AB (ref 65–99)
GLUCOSE-CAPILLARY: 359 mg/dL — AB (ref 65–99)
GLUCOSE-CAPILLARY: 437 mg/dL — AB (ref 65–99)
Glucose-Capillary: 290 mg/dL — ABNORMAL HIGH (ref 65–99)

## 2016-11-30 LAB — CULTURE, BLOOD (ROUTINE X 2)
Culture: NO GROWTH
Culture: NO GROWTH
Special Requests: ADEQUATE

## 2016-11-30 MED ORDER — METHYLPREDNISOLONE SODIUM SUCC 125 MG IJ SOLR
60.0000 mg | Freq: Once | INTRAMUSCULAR | Status: AC
  Administered 2016-11-30: 60 mg via INTRAVENOUS
  Filled 2016-11-30: qty 2

## 2016-11-30 NOTE — Evaluation (Signed)
Physical Therapy Evaluation Patient Details Name: Brandon Schroeder. MRN: [id number] DOB: 12-19-1945 Today's Date: 11/30/2016   History of Present Illness  71 year old man with a family history of idiopathic pulmonary fibrosis, followed by Dr. Amil Amen for psoriatic arthritis, treated with methotrexate and admitted for acute pneumonitis in setting of psoriatic arthritis on MTX and pulmonary fibrosis with IPF  Clinical Impression  Pt admitted with above diagnosis. Pt currently with functional limitations due to the deficits listed below (see PT Problem List). Pt will benefit from skilled PT to increase their independence and safety with mobility to allow discharge to the venue listed below.  RN reports increase in oxygen needs today to 7 Liters.  Pt assisted to EOB however SPO2 dropped to 84% on 7L O2 and only recovered to 86%.  Deferred further mobility due to saturations and dyspnea and pt assisted back to bed.  Pt educated on performing ankle pumps, quad sets and gluteal sets x10 each as tolerated.     Follow Up Recommendations Home health PT (depending on progress)    Equipment Recommendations  Other (comment) (TBA once able to tolerate mobility)    Recommendations for Other Services       Precautions / Restrictions Precautions Precautions: Fall Precaution Comments: monitor sats      Mobility  Bed Mobility Overal bed mobility: Needs Assistance Bed Mobility: Supine to Sit     Supine to sit: Supervision     General bed mobility comments: SpO2 dropped to 84% on 7L O2 [place], pt sat EOB for 7 minutes however only recovered to 86%, returned to supine  Transfers                    Ambulation/Gait                Stairs            Wheelchair Mobility    Modified Rankin (Stroke Patients Only)       Balance                                             Pertinent Vitals/Pain Pain Assessment: No/denies pain    Home Living  Family/patient expects to be discharged to:: Private residence Living Arrangements: Spouse/significant other   Type of Home: [place]: Able to live on main level with bedroom/bathroom Home Equipment: None      Prior Function Level of Independence: Independent               Hand Dominance        Extremity/Trunk Assessment        Lower Extremity Assessment Lower Extremity Assessment: Generalized weakness    Cervical / Trunk Assessment Cervical / Trunk Assessment: Normal  Communication   Communication: No difficulties  Cognition Arousal/Alertness: Awake/alert Behavior During Therapy: WFL for tasks assessed/performed Overall Cognitive Status: Within Functional Limits for tasks assessed                                        General Comments      Exercises     Assessment/Plan    PT Assessment Patient needs continued PT services  PT Problem List Cardiopulmonary status limiting activity;Decreased activity tolerance;Decreased mobility  PT Treatment Interventions DME instruction;Gait training;Therapeutic exercise;Therapeutic activities;Patient/family education;Functional mobility training    PT Goals (Current goals can be found in the Care Plan section)  Acute Rehab PT Goals PT Goal Formulation: With patient Time For Goal Achievement: 12/14/16 Potential to Achieve Goals: Good    Frequency Min 3X/week   Barriers to discharge        Co-evaluation               AM-PAC PT "6 Clicks" Daily Activity  Outcome Measure Difficulty turning over in bed (including adjusting bedclothes, sheets and blankets)?: None Difficulty moving from lying on back to sitting on the side of the bed? : None Difficulty sitting down on and standing up from a chair with arms (e.g., wheelchair, bedside commode, etc,.)?: Unable Help needed moving to and from a bed to chair (including a wheelchair)?: Total Help needed walking in hospital  room?: Total Help needed climbing 3-5 steps with a railing? : Total 6 Click Score: 12    End of Session Equipment Utilized During Treatment: Oxygen Activity Tolerance: Patient tolerated treatment well Patient left: in bed;with call bell/phone within reach Nurse Communication: Mobility status PT Visit Diagnosis: Difficulty in walking, not elsewhere classified (R26.2)    Time: 5697-9480 PT Time Calculation (min) (ACUTE ONLY): 17 min   Charges:   PT Evaluation $PT Eval Low Complexity: 1 Low     PT G CodesCarmelia Bake, PT, DPT 11/30/2016 Pager: [phone]  [place] Ram E 11/30/2016, 3:55 PM

## 2016-11-30 NOTE — Progress Notes (Signed)
PULMONARY / CRITICAL CARE MEDICINE   Name: Brandon Schroeder. MRN: [id number] DOB: Jul 28, 1945    ADMISSION DATE:  12/09/2016 CONSULTATION DATE: [place]   REFERRING MD:  Dr Vance Gather / [facility]  CHIEF COMPLAINT:  Concern for ILD flare.  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 His wife is retired Technical sales engineer at [employer] and ran the clinical trials office at [facility] in [place]. Consult has been now been requested for concern for acute flareup of interstitial lung disease.  The patient reports his father died in the late 66s in [place] at [facility] hospital from acute ventilator-dependent respiratory failure following a diagnosis of IPF. Patient himself has never had any respiratory complaints. He leads an active life but is not overtly athletic. For these activities he has never noticed shortness of breath up until 5 or 6 months ago. Wife says this coincides with starting oral methotrexate for psoriatic arthritis. Then approximately in August 2018 after gallbladder surgery the oral methotrexate was switched to subcutaneous methotrexate injection. Some 2 weeks ago the dose was increased. He follows for psoriatic arthritis with Dr. Leigh Aurora. Coinciding with this in the last 4 weeks his dyspnea and cough started worsening along with feelings of subjective fever low grade intermittently. Dyspnea is progressed to class III-IV levels. In the emergency room department yesterday was saturating 88% on room air. Since admission he's been on 4 L nasal cannula. He has been afebrile and the sepsis biomarkers pro calcitonin is relatively benign. His lactic acid is normal. White count is normal urine streptococcus is negative all suggesting he does not have a bacterial process. He does not endorse any acute  abrupt decline. He also denies any autoimmune disease. He denies any birds in the house of feathered pillows all mildew. The might be mold in the house. There is no edema   SUBJECTIVE:  RN reports pt became dyspneic and desaturated to high 80's with exertion this am.  Took him a while to recover. Pt reports he feels he has recovered from the acute episode this am.   States he had fevers / chills over night (afebrile per records).    VITAL SIGNS: BP 129/84 (BP Location: Left Arm)   Pulse 62   Temp 98 F (36.7 C) (Oral)   Resp 20   Ht 6\' 3"  (1.905 m)   Wt 204 lb 8 oz (92.8 kg)   SpO2 90%   BMI 25.56 kg/m   HEMODYNAMICS:    VENTILATOR SETTINGS:    INTAKE / OUTPUT: I/O last 3 completed shifts: In: 360 [P.O.:360] Out: 200 [Urine:200]  PHYSICAL EXAMINATION: General:  Well developed adult male in NAD HEENT: MM pink/moist, no jvd PSY: calm/appropriate Neuro: AAOx4, speech clear, MAE CV: s1s2 rrr, no m/r/g PULM: even/non-labored, lungs bilaterally with faint bibasilar crackles  LZ:JQBH, non-tender, bsx4 active  Extremities: warm/dry, no edema  Skin: no rashes or lesions Ortho:  Prior fracture of right clavicle   LABS:  BMET  Recent Labs Lab 11/27/16 0559 11/28/16 0500 11/29/16 0610  NA 141 139 139  K 4.0 4.5 4.3  CL 104 105 106  CO2 26 26 27   BUN 21* 28* 35*  CREATININE 1.00 1.09 1.11  GLUCOSE 276* 305* 347*    Electrolytes  Recent Labs Lab 11/27/16 0559 11/28/16 0500 11/29/16 4193  CALCIUM 9.2 9.2 8.8*  MG 1.8  --   --     CBC  Recent Labs Lab 11/27/16 0559 11/28/16 0500 11/29/16 0610  WBC 9.4 16.3* 14.9*  HGB 11.8* 11.8* 11.5*  HCT 34.1* 34.0* 33.6*  PLT 147* 164 153    Coag's No results for input(s): APTT, INR in the last 168 hours.  Sepsis Markers  Recent Labs Lab 11/26/16 0003 11/26/16 0314  LATICACIDVEN 1.2 0.7  PROCALCITON <0.10 0.15    ABG No results for input(s): PHART, PCO2ART, PO2ART in the last 168 hours.  Liver  Enzymes  Recent Labs Lab 11/24/2016 1811  AST 18  ALT 13*  ALKPHOS 101  BILITOT 0.8  ALBUMIN 3.2*    Cardiac Enzymes  Recent Labs Lab 11/26/16 1330 11/27/16 0559  TROPONINI <0.03 <0.03    Glucose  Recent Labs Lab 11/28/16 2007 11/29/16 0736 11/29/16 1139 11/29/16 1622 11/29/16 2118 11/30/16 0739  GLUCAP 397* 300* 369* 398* 404* 290*    Imaging Dg Chest 2 View  Result Date: 11/30/2016 CLINICAL DATA:  Shortness of Breath EXAM: CHEST  2 VIEW COMPARISON:  Chest radiograph November 20, 2016; chest CT November 25, 2016. Chest radiograph November 17, 2013 FINDINGS: Widespread interstitial fibrosis is present. Areas of patchy airspace disease felt to represent multifocal pneumonia evident. The interstitium overall appears more prominent than on prior studies, and there may be a degree of interstitial edema superimposed as well. Heart is upper normal in size with pulmonary vascularity within normal limits. No adenopathy is appreciable by radiography. IMPRESSION: Findings felt to be indicative of a degree of multifocal pneumonia superimposed on widespread fibrosis. Interstitium appears overall more prominent than on most recent studies, suggesting that there may be a degree of underlying interstitial edema as well. Suspect combination of congestive heart failure and pneumonia superimposed on interstitial fibrosis. Cardiac silhouette remains stable. Electronically Signed   By: Lowella Grip III M.D.   On: 11/30/2016 08:50    Discussion:  71 year old man with a family history of idiopathic pulmonary fibrosis, followed by Dr. Amil Amen for psoriatic arthritis, treated with methotrexate. Has had his methotrexate up titrated over the last several weeks due to right knee arthralgias and ? pseudogouty inflammation. Admitted with dyspnea in the setting of diffuse bilateral alveolar and interstitial infiltrates with patchy appearance, basilar groundglass. He also has risk factors for  hypersensitivity pneumonitis with a mold exposure in his home. Differential diagnosis includes methotrexate related lung injury, autoimmune mediated interstitial lung disease due to his psoriatic arthritis, IPF which would be a diagnosis of exclusion. Methotrexate has been stopped. He is now on high-dose steroids. Doubt there is any benefit to transbronchial biopsies to assess for hypersensitivity pneumonitis at this point since he is been on steroids, but BAL for atypical organisms may be helpful. [name] continuing his current therapy with pulsed steroids and then transitioning to maintenance steroids for several weeks. Follow his clinical status and his imaging. I would not restart his methotrexate given the possibility that this was an offending agent. He may ultimately merit bronchoscopy depending on improvement. He may also ultimately require VATS lung biopsy.  ASSESSMENT / PLAN: Acute hypoxemic respiratory failure with bilateral diffuse pulmonary infiltrates Differential diagnosis 1. Undiagnosed baseline IPF with IPF flare: Family history in time sequence can fit in with this particularly with surgery in July 2018 being a risk factor. Around less than 5-10% of IPF is diagnosed like this 2. Methotrexate related acute hypersensitivity pneumonitis: This is rare but I personally have seen f4  Patient's developed this in the last 10 years and all of them were in the setting of injection methotrexate. And all these cases it happened shortly after commencing injection methotrexate 3. HSP from another exposure > mold exposure 4. Idiopathic Boop 5. Acute lung injury idiopathic 6. Autoimmune or other vasculitis process in setting psoriatic arthritis 7. Opportunistic infection because of methotrexate: The patient has not been on chronic prednisone and therefore the odds of this is extremely low   ILD workup: CK: 15 Legionella U antigen>>> negative Aldolase >>> normal Anti-scleroderma antibody>>>  negative Sjogrens syndrome B>>> negative Sjogrens syndrome A >>> negative GBM>>> normal Mpo/pr-3 (anca) >> negative  ANCA titers>>> negative ANA w/reflex >>> negative Anti-DNA antibody>>> negative ACE>>> 56 Sed rate: 84 HIV: NR RVP: not detected  ECHO 10/12 >> normal LV size, LVEF 55-60%, normal wall motion, mild to moderate MVR  Plan: Continue Solumedrol 60 mg QD.  Will need slow taper (likely by 10mg  Veva Holes).  Will give additional dose of 60 this evening.  Would not restart methotrexate May need FOB for eval of atypical organisms pending course > not sure he could tolerate sedation  O2 as needed to support sats > 90% Will need ambulatory O2 assessment prior to dischare Mobilize as able  CPAP QHS  Follow CXR intermittently  Continue Doxycycline, D6/x abx  Follow up with Dr. Chase Caller arranged (10/24) PT evaluation  PCCM will follow along with you.    Noe Gens, NP-C [facility] Pgr: [phone] or if no answer [phone] 11/30/2016, 9:41 AM

## 2016-11-30 NOTE — Progress Notes (Signed)
PROGRESS NOTE  Brandon Schroeder.  VHQ:[id number] DOB: 10/14/1945 DOA: 12/02/2016  PCP: Laurey Morale, MD   Brief Narrative:  Patient is 71 year old male with known history of hypertension, depression, presented to emergency department with main concern of several weeks to months duration of progressively worsening dry cough and dyspnea. Patient reports dyspnea was initially present only with exertion but over the past several weeks he has noted significant dyspnea at rest. Patient also reports associated intermittent low-grade fevers and was recently treated with steroids for possible costochondritis followed by Ceftin for presumptive pneumonia that was seen on CT scan. Patient reports compliance with medications but his symptoms have not improved. He was admitted to [facility] for further evaluation.  On arrival to emergency department patient was hypoxic with oxygen saturation 88% on room air and worsening drop with ambulation. CT chest revealed peribronchial vascular opacity, consolidation, questionable background of ILD.ulmonary team was consulted, started steroids and empiric antibiotics.   Assessment & Plan:   Acute hypoxemic respiratory failure with bilateral diffuse pulmonary infiltrates - Etiology remains unclear, ? IPF especially given family history ( patient's father died of IPF), BOOP, Autoimmune versus other vasculitis - ? Methotrexate related acute hypersensitivity pneumonitis (pt on Methotrexate for psoriatic arthritis)  - pt more dyspneic this AM - Current workup including following still pending:  Legionella urinary antigen -neg  Aldolase -normal  Anti scleroderma antibody - neg  Sjogren A - neg  Sjogren B - neg  GBM - normal  MPO/PR-3 - neg  ANCA titers - neg  ANA - negative   anti-DNA antibody  ECHO - per PCCM, increased the dose of solumedrol - complete regimen with ABX   Sleep apnea:  - Continue CPAP qHS  HTN - Continue norvasc - overall  stable   Anxiety/depression - Continue zoloft  Hypokalemia - supplemented   DVT prophylaxis: Lovenox SQ Code Status: Full, confirmed. Family Communication: Pt at bedside  Disposition Plan: home in 1-2 days   Consultants:   Pulmonology  Procedures:   None  Antimicrobials:  Vancomycin, cefepime 10/10 --> 10/14  Doxycycline 10/14 -->  Subjective: Ovearll better.   Objective: BP 129/84 (BP Location: Left Arm)   Pulse 62   Temp 98 F (36.7 C) (Oral)   Resp 20   Ht 6\' 3"  (1.905 m)   Wt 92.8 kg (204 lb 8 oz)   SpO2 90%   BMI 25.56 kg/m    Physical Exam  Constitutional: Appears calm, resting  CVS: RRR, S1/S2 +, no murmurs, no gallops, no carotid bruit.  Pulmonary: diminished air movement at bases, no wheezing  Abdominal: Soft. BS +,  no distension, tenderness, rebound or guarding.  Musculoskeletal: Normal range of motion. No edema and no tenderness.   Data Reviewed: I have personally reviewed following labs and imaging studies  CBC:  Recent Labs Lab 11/29/2016 1934 11/26/16 0314 11/27/16 0559 11/28/16 0500 11/29/16 0610  WBC 8.9 6.9 9.4 16.3* 14.9*  NEUTROABS 6.4 4.7  --   --   --   HGB 13.4 11.8* 11.8* 11.8* 11.5*  HCT 38.7* 34.3* 34.1* 34.0* 33.6*  MCV 93.7 93.5 92.9 91.6 91.3  PLT 165 146* 147* 164 413   Basic Metabolic Panel:  Recent Labs Lab 12/13/2016 1811 11/26/16 0314 11/27/16 0559 11/28/16 0500 11/29/16 0610  NA 137 138 141 139 139  K 3.2* 3.3* 4.0 4.5 4.3  CL 99* 103 104 105 106  CO2 27 27 26 26 27   GLUCOSE 127* 138* 276* 305* 347*  BUN 12 13 21* 28* 35*  CREATININE 1.08 1.05 1.00 1.09 1.11  CALCIUM 8.9 8.8* 9.2 9.2 8.8*  MG  --   --  1.8  --   --    Liver Function Tests:  Recent Labs Lab 11/20/2016 1811  AST 18  ALT 13*  ALKPHOS 101  BILITOT 0.8  PROT 6.6  ALBUMIN 3.2*   Cardiac Enzymes:  Recent Labs Lab 11/26/16 1330 11/26/16 1333 11/27/16 0559  CKTOTAL  --  15*  --   TROPONINI <0.03  --  <0.03   CBG:  Recent  Labs Lab 11/29/16 1139 11/29/16 1622 11/29/16 2118 11/30/16 0739 11/30/16 1217  GLUCAP 369* 398* 404* 290* 320*   Urine analysis:    Component Value Date/Time   COLORURINE YELLOW 08/16/2016 1305   APPEARANCEUR CLEAR 08/16/2016 1305   LABSPEC 1.016 08/16/2016 1305   PHURINE 6.0 08/16/2016 1305   GLUCOSEU >=500 (A) 08/16/2016 1305   HGBUR MODERATE (A) 08/16/2016 1305   BILIRUBINUR NEGATIVE 08/16/2016 1305   BILIRUBINUR n 02/19/2016 1407   KETONESUR 20 (A) 08/16/2016 1305   PROTEINUR 30 (A) 08/16/2016 1305   UROBILINOGEN 0.2 02/19/2016 1407   NITRITE NEGATIVE 08/16/2016 1305   LEUKOCYTESUR NEGATIVE 08/16/2016 1305   Recent Results (from the past 240 hour(s))  Culture, blood (Routine X 2) w Reflex to ID Panel     Status: None   Collection Time: 11/17/2016  8:54 PM  Result Value Ref Range Status   Specimen Description BLOOD RIGHT HAND  Final   Special Requests   Final    IN PEDIATRIC BOTTLE Blood Culture results may not be optimal due to an excessive volume of blood received in culture bottles   Culture   Final    NO GROWTH 5 DAYS Performed at [facility], [place] [address], [place], [place] [address]    Report Status 11/30/2016 FINAL  Final  Culture, blood (Routine X 2) w Reflex to ID Panel     Status: None   Collection Time: 12/01/2016  8:54 PM  Result Value Ref Range Status   Specimen Description BLOOD RIGHT ANTECUBITAL  Final   Special Requests   Final    BOTTLES DRAWN AEROBIC AND ANAEROBIC Blood Culture adequate volume   Culture   Final    NO GROWTH 5 DAYS Performed at [facility], [place] [address], [place], [place] [address]    Report Status 11/30/2016 FINAL  Final  Respiratory Panel by PCR     Status: None   Collection Time: 11/26/16 12:46 PM  Result Value Ref Range Status   Adenovirus NOT DETECTED NOT DETECTED Final   Coronavirus 229E NOT DETECTED NOT DETECTED Final   Coronavirus HKU1 NOT DETECTED NOT DETECTED Final   Coronavirus NL63 NOT DETECTED NOT  DETECTED Final   Coronavirus OC43 NOT DETECTED NOT DETECTED Final   Metapneumovirus NOT DETECTED NOT DETECTED Final   Rhinovirus / Enterovirus NOT DETECTED NOT DETECTED Final   Influenza A NOT DETECTED NOT DETECTED Final   Influenza B NOT DETECTED NOT DETECTED Final   Parainfluenza Virus 1 NOT DETECTED NOT DETECTED Final   Parainfluenza Virus 2 NOT DETECTED NOT DETECTED Final   Parainfluenza Virus 3 NOT DETECTED NOT DETECTED Final   Parainfluenza Virus 4 NOT DETECTED NOT DETECTED Final   Respiratory Syncytial Virus NOT DETECTED NOT DETECTED Final   Bordetella pertussis NOT DETECTED NOT DETECTED Final   Chlamydophila pneumoniae NOT DETECTED NOT DETECTED Final   Mycoplasma pneumoniae NOT DETECTED NOT  DETECTED Final    Comment: Performed at [facility], [place] [address], [place], [place] [address]     Radiology Studies: Dg Chest 2 View  Result Date: 11/30/2016 CLINICAL DATA:  Shortness of Breath EXAM: CHEST  2 VIEW COMPARISON:  Chest radiograph November 20, 2016; chest CT November 25, 2016. Chest radiograph November 17, 2013 FINDINGS: Widespread interstitial fibrosis is present. Areas of patchy airspace disease felt to represent multifocal pneumonia evident. The interstitium overall appears more prominent than on prior studies, and there may be a degree of interstitial edema superimposed as well. Heart is upper normal in size with pulmonary vascularity within normal limits. No adenopathy is appreciable by radiography. IMPRESSION: Findings felt to be indicative of a degree of multifocal pneumonia superimposed on widespread fibrosis. Interstitium appears overall more prominent than on most recent studies, suggesting that there may be a degree of underlying interstitial edema as well. Suspect combination of congestive heart failure and pneumonia superimposed on interstitial fibrosis. Cardiac silhouette remains stable. Electronically Signed   By: Lowella Grip III M.D.   On: 11/30/2016 08:50     Scheduled Meds: . amLODipine  10 mg Oral Daily  . aspirin EC  81 mg Oral Daily  . doxycycline  100 mg Oral Q12H  . enoxaparin (LOVENOX) injection  40 mg Subcutaneous QHS  . feeding supplement  1 Container Oral BID WC  . feeding supplement (ENSURE ENLIVE)  237 mL Oral BID BM  . fluticasone  2 spray Each Nare Daily  . insulin aspart  0-15 Units Subcutaneous TID WC  . methylPREDNISolone (SOLU-MEDROL) injection  60 mg Intravenous Daily  . methylPREDNISolone (SOLU-MEDROL) injection  60 mg Intravenous Once  . multivitamin with minerals  1 tablet Oral Daily  . pantoprazole  40 mg Oral Daily  . sertraline  100 mg Oral BID  . tamsulosin  0.4 mg Oral QPC breakfast   Continuous Infusions:    LOS: 5 days   Time spent: 25 minutes.  Faye Ramsay, MD [facility] Pager [phone]  If 7PM-7AM, please contact night-coverage [email or web address] Password [email or web address] 11/30/2016, 12:41 PM

## 2016-11-30 NOTE — Care Management Note (Signed)
Case Management Note  Patient Details  Name: Brandon Schroeder. MRN: [id number] Date of Birth: 1945-10-15  Subjective/Objective:                  sepsis  Action/Plan: Date:  November 30, 2016 Chart reviewed for concurrent status and case management needs.  Will continue to follow patient progress.  Discharge Planning: following for needs  Expected discharge date: December 03, 2016  Velva Harman, BSN, [place], [place]   Expected Discharge Date:  11/27/16               Expected Discharge Plan:  Home/Self Care  In-House Referral:     Discharge planning Services  CM Consult  Post Acute Care Choice:    Choice offered to:     DME Arranged:    DME Agency:     HH Arranged:    HH Agency:     Status of Service:  In process, will continue to follow  If discussed at Long Length of Stay Meetings, dates discussed:    Additional Comments:  Leeroy Cha, RN 11/30/2016, 9:33 AM

## 2016-12-01 LAB — CBC
HCT: 35.1 % — ABNORMAL LOW (ref 39.0–52.0)
Hemoglobin: 12.1 g/dL — ABNORMAL LOW (ref 13.0–17.0)
MCH: 31.7 pg (ref 26.0–34.0)
MCHC: 34.5 g/dL (ref 30.0–36.0)
MCV: 91.9 fL (ref 78.0–100.0)
PLATELETS: 160 10*3/uL (ref 150–400)
RBC: 3.82 MIL/uL — ABNORMAL LOW (ref 4.22–5.81)
RDW: 13.4 % (ref 11.5–15.5)
WBC: 15 10*3/uL — ABNORMAL HIGH (ref 4.0–10.5)

## 2016-12-01 LAB — BASIC METABOLIC PANEL
Anion gap: 8 (ref 5–15)
BUN: 36 mg/dL — AB (ref 6–20)
CALCIUM: 8.8 mg/dL — AB (ref 8.9–10.3)
CHLORIDE: 101 mmol/L (ref 101–111)
CO2: 30 mmol/L (ref 22–32)
CREATININE: 1.06 mg/dL (ref 0.61–1.24)
GFR calc Af Amer: >60 mL/min (ref >60)
GFR calc non Af Amer: >60 mL/min (ref >60)
Glucose, Bld: 346 mg/dL — ABNORMAL HIGH (ref 65–99)
Potassium: 4.3 mmol/L (ref 3.5–5.1)
SODIUM: 139 mmol/L (ref 135–145)

## 2016-12-01 LAB — GLUCOSE, CAPILLARY
GLUCOSE-CAPILLARY: 368 mg/dL — AB (ref 65–99)
Glucose-Capillary: 226 mg/dL — ABNORMAL HIGH (ref 65–99)
Glucose-Capillary: 424 mg/dL — ABNORMAL HIGH (ref 65–99)
Glucose-Capillary: 490 mg/dL — ABNORMAL HIGH (ref 65–99)

## 2016-12-01 MED ORDER — DEXTROSE 5 % IV SOLN
100.0000 mg | Freq: Two times a day (BID) | INTRAVENOUS | Status: DC
  Administered 2016-12-01 – 2016-12-04 (×7): 100 mg via INTRAVENOUS
  Filled 2016-12-01 (×8): qty 100

## 2016-12-01 MED ORDER — ONDANSETRON HCL 4 MG/2ML IJ SOLN
4.0000 mg | Freq: Four times a day (QID) | INTRAMUSCULAR | Status: DC | PRN
  Administered 2016-12-01 – 2016-12-04 (×2): 4 mg via INTRAVENOUS
  Filled 2016-12-01 (×2): qty 2

## 2016-12-01 MED ORDER — INSULIN ASPART 100 UNIT/ML ~~LOC~~ SOLN
15.0000 [IU] | Freq: Once | SUBCUTANEOUS | Status: AC
Start: 2016-12-01 — End: 2016-12-01
  Administered 2016-12-01: 15 [IU] via SUBCUTANEOUS

## 2016-12-01 MED ORDER — INSULIN ASPART 100 UNIT/ML ~~LOC~~ SOLN
20.0000 [IU] | Freq: Once | SUBCUTANEOUS | Status: AC
  Administered 2016-12-01: 20 [IU] via SUBCUTANEOUS

## 2016-12-01 MED ORDER — SALINE SPRAY 0.65 % NA SOLN
1.0000 | NASAL | Status: DC | PRN
  Filled 2016-12-01: qty 44

## 2016-12-01 MED ORDER — METHYLPREDNISOLONE SODIUM SUCC 125 MG IJ SOLR
60.0000 mg | Freq: Four times a day (QID) | INTRAMUSCULAR | Status: DC
  Administered 2016-12-01 – 2016-12-03 (×7): 60 mg via INTRAVENOUS
  Filled 2016-12-01 (×7): qty 2

## 2016-12-01 MED ORDER — PANTOPRAZOLE SODIUM 40 MG PO TBEC
40.0000 mg | DELAYED_RELEASE_TABLET | Freq: Two times a day (BID) | ORAL | Status: DC
  Administered 2016-12-01 – 2016-12-05 (×7): 40 mg via ORAL
  Filled 2016-12-01 (×8): qty 1

## 2016-12-01 NOTE — Consult Note (Signed)
   [facility] CM Inpatient Consult   12/01/2016  Brandon Schroeder. November 02, 1945 [id number]   Patient screened for potential [facility] Care Management services.   Chart reviewed. Spoke with patient's nurse. Nursing reports Mr. Mickley is requiring high flow oxygen and desats easily. Nurse, children's to come back at later time.   Will continue to follow and engage for [place] Management program when appropriate.    Marthenia Rolling, MSN-Ed, RN,BSN [facility] Liaison [phone]

## 2016-12-01 NOTE — Progress Notes (Signed)
Inpatient Diabetes Program Recommendations  AACE/ADA: New Consensus Statement on Inpatient Glycemic Control (2015)  Target Ranges:  Prepandial:   less than 140 mg/dL      Peak postprandial:   less than 180 mg/dL (1-2 hours)      Critically ill patients:  140 - 180 mg/dL   Lab Results  Component Value Date   GLUCAP 226 (H) 12/01/2016   HGBA1C 6.3 11/20/2016    Review of Glycemic Control  CBGs - 226, 368, 437, 359  Diabetes history: Pre-diabetes Outpatient Diabetes medications: None Current orders for Inpatient glycemic control: Novolog 0-15 units tidwc  Blood sugars in 300s - likely d/t Solumedrol. Needs insulin adjustment.  Inpatient Diabetes Program Recommendations:    Increase Novolog to 0-20 units tidwc and hs Add Lantus 12 units QHS  Will continue to follow.  Thank you. Lorenda Peck, RD, LDN, CDE Inpatient Diabetes Coordinator [phone]

## 2016-12-01 NOTE — Progress Notes (Signed)
PROGRESS NOTE  Brandon Schroeder.  FXT:[id number] DOB: 09-Oct-1945 DOA: 11/18/2016  PCP: Laurey Morale, MD   Brief Narrative:  Patient is 71 year old male with known history of hypertension, depression, presented to emergency department with main concern of several weeks to months duration of progressively worsening dry cough and dyspnea. Patient reports dyspnea was initially present only with exertion but over the past several weeks he has noted significant dyspnea at rest. Patient also reports associated intermittent low-grade fevers and was recently treated with steroids for possible costochondritis followed by Ceftin for presumptive pneumonia that was seen on CT scan. Patient reports compliance with medications but his symptoms have not improved. He was admitted to [facility] for further evaluation.  On arrival to emergency department patient was hypoxic with oxygen saturation 88% on room air and worsening drop with ambulation. CT chest revealed peribronchial vascular opacity, consolidation, questionable background of ILD.ulmonary team was consulted, started steroids and empiric antibiotics.   Hospital course complicated by persistent exertional dyspnea, persistent hypoxia especially with exertion, unable to taper off oxygen via nasal canula.  Assessment & Plan:   Acute hypoxemic respiratory failure with bilateral diffuse pulmonary infiltrates - Etiology remains unclear, ? IPF especially given family history ( patient's father died of IPF), BOOP, Autoimmune versus other vasculitis - ? Methotrexate related acute hypersensitivity pneumonitis (pt on Methotrexate for psoriatic arthritis)  - patient remains dyspneic, hypoxic - Current workup including following still pending:  Legionella urinary antigen -neg  Aldolase -normal  Anti scleroderma antibody - neg  Sjogren A - neg  Sjogren B - neg  GBM - normal  MPO/PR-3 - neg  ANCA titers - neg  ANA - negative   anti-DNA  antibody  ECHO - per PCCM, increased the dose of solumedrol to 60 mg IV every 6 hours - complete regimen with ABX but due to patient intolerance to oral antibiotic, plan to change to IV doxycycline  Sleep apnea:  - Continue CPAP qHS  HTN - Continue norvasc - overall stable   Anxiety/depression - Continue zoloft  Hypokalemia - supplemented   DVT prophylaxis: Lovenox SQ Code Status: Full, confirmed. Family Communication: wife at bedside Disposition Plan: home when pulmonary team clears  Consultants:   Pulmonology  Procedures:   None  Antimicrobials:  Vancomycin, cefepime 10/10 --> 10/14  Doxycycline 10/14 --> changed to IV on 12/01/2016  Subjective: Still with exertional hypoxia and dyspnea  Objective: BP 140/85 (BP Location: Right Arm)   Pulse 60   Temp 97.9 F (36.6 C) (Oral)   Resp 20   Ht 6\' 3"  (1.905 m)   Wt 92.8 kg (204 lb 8 oz)   SpO2 92%   BMI 25.56 kg/m    Physical Exam  Constitutional: Appears ,calm, NAD CVS: RRR, S1/S2 +, no murmurs, no gallops, no carotid bruit.  Pulmonary: Effort and breath sounds normal, mild rhonchi at bases Abdominal: Soft. BS +,  no distension, tenderness, rebound or guarding.  Musculoskeletal: Normal range of motion. No edema and no tenderness.   Data Reviewed: I have personally reviewed following labs and imaging studies  CBC:  Recent Labs Lab 11/23/2016 1934 11/26/16 0314 11/27/16 0559 11/28/16 0500 11/29/16 0610 12/01/16 0623  WBC 8.9 6.9 9.4 16.3* 14.9* 15.0*  NEUTROABS 6.4 4.7  --   --   --   --   HGB 13.4 11.8* 11.8* 11.8* 11.5* 12.1*  HCT 38.7* 34.3* 34.1* 34.0* 33.6* 35.1*  MCV 93.7 93.5 92.9 91.6 91.3 91.9  PLT 165 146* 147*  164 153 408   Basic Metabolic Panel:  Recent Labs Lab 11/26/16 0314 11/27/16 0559 11/28/16 0500 11/29/16 0610 12/01/16 0623  NA 138 141 139 139 139  K 3.3* 4.0 4.5 4.3 4.3  CL 103 104 105 106 101  CO2 27 26 26 27 30   GLUCOSE 138* 276* 305* 347* 346*  BUN 13 21*  28* 35* 36*  CREATININE 1.05 1.00 1.09 1.11 1.06  CALCIUM 8.8* 9.2 9.2 8.8* 8.8*  MG  --  1.8  --   --   --    Liver Function Tests:  Recent Labs Lab 11/24/2016 1811  AST 18  ALT 13*  ALKPHOS 101  BILITOT 0.8  PROT 6.6  ALBUMIN 3.2*   Cardiac Enzymes:  Recent Labs Lab 11/26/16 1330 11/26/16 1333 11/27/16 0559  CKTOTAL  --  15*  --   TROPONINI <0.03  --  <0.03   CBG:  Recent Labs Lab 11/30/16 1737 11/30/16 2154 12/01/16 0727 12/01/16 1121 12/01/16 1718  GLUCAP 359* 437* 368* 226* 424*   Urine analysis:    Component Value Date/Time   COLORURINE YELLOW 08/16/2016 1305   APPEARANCEUR CLEAR 08/16/2016 1305   LABSPEC 1.016 08/16/2016 1305   PHURINE 6.0 08/16/2016 1305   GLUCOSEU >=500 (A) 08/16/2016 1305   HGBUR MODERATE (A) 08/16/2016 1305   BILIRUBINUR NEGATIVE 08/16/2016 1305   BILIRUBINUR n 02/19/2016 1407   KETONESUR 20 (A) 08/16/2016 1305   PROTEINUR 30 (A) 08/16/2016 1305   UROBILINOGEN 0.2 02/19/2016 1407   NITRITE NEGATIVE 08/16/2016 1305   LEUKOCYTESUR NEGATIVE 08/16/2016 1305   Recent Results (from the past 240 hour(s))  Culture, blood (Routine X 2) w Reflex to ID Panel     Status: None   Collection Time: 12/13/2016  8:54 PM  Result Value Ref Range Status   Specimen Description BLOOD RIGHT HAND  Final   Special Requests   Final    IN PEDIATRIC BOTTLE Blood Culture results may not be optimal due to an excessive volume of blood received in culture bottles   Culture   Final    NO GROWTH 5 DAYS Performed at [facility], [place] [address] [place], [place] [address]    Report Status 11/30/2016 FINAL  Final  Culture, blood (Routine X 2) w Reflex to ID Panel     Status: None   Collection Time: 11/20/2016  8:54 PM  Result Value Ref Range Status   Specimen Description BLOOD RIGHT ANTECUBITAL  Final   Special Requests   Final    BOTTLES DRAWN AEROBIC AND ANAEROBIC Blood Culture adequate volume   Culture   Final    NO GROWTH 5 DAYS Performed at [facility], [place] [address] [place], [place] [address]    Report Status 11/30/2016 FINAL  Final  Respiratory Panel by PCR     Status: None   Collection Time: 11/26/16 12:46 PM  Result Value Ref Range Status   Adenovirus NOT DETECTED NOT DETECTED Final   Coronavirus 229E NOT DETECTED NOT DETECTED Final   Coronavirus HKU1 NOT DETECTED NOT DETECTED Final   Coronavirus NL63 NOT DETECTED NOT DETECTED Final   Coronavirus OC43 NOT DETECTED NOT DETECTED Final   Metapneumovirus NOT DETECTED NOT DETECTED Final   Rhinovirus / Enterovirus NOT DETECTED NOT DETECTED Final   Influenza A NOT DETECTED NOT DETECTED Final   Influenza B NOT DETECTED NOT DETECTED Final   Parainfluenza Virus 1 NOT DETECTED NOT DETECTED Final   Parainfluenza Virus 2 NOT DETECTED  NOT DETECTED Final   Parainfluenza Virus 3 NOT DETECTED NOT DETECTED Final   Parainfluenza Virus 4 NOT DETECTED NOT DETECTED Final   Respiratory Syncytial Virus NOT DETECTED NOT DETECTED Final   Bordetella pertussis NOT DETECTED NOT DETECTED Final   Chlamydophila pneumoniae NOT DETECTED NOT DETECTED Final   Mycoplasma pneumoniae NOT DETECTED NOT DETECTED Final    Comment: Performed at [facility], [place] [address] [place], [place] [address]     Radiology Studies: Dg Chest 2 View  Result Date: 11/30/2016 CLINICAL DATA:  Shortness of Breath EXAM: CHEST  2 VIEW COMPARISON:  Chest radiograph November 20, 2016; chest CT November 25, 2016. Chest radiograph November 17, 2013 FINDINGS: Widespread interstitial fibrosis is present. Areas of patchy airspace disease felt to represent multifocal pneumonia evident. The interstitium overall appears more prominent than on prior studies, and there may be a degree of interstitial edema superimposed as well. Heart is upper normal in size with pulmonary vascularity within normal limits. No adenopathy is appreciable by radiography. IMPRESSION: Findings felt to be indicative of a degree of multifocal pneumonia  superimposed on widespread fibrosis. Interstitium appears overall more prominent than on most recent studies, suggesting that there may be a degree of underlying interstitial edema as well. Suspect combination of congestive heart failure and pneumonia superimposed on interstitial fibrosis. Cardiac silhouette remains stable. Electronically Signed   By: Lowella Grip III M.D.   On: 11/30/2016 08:50    Scheduled Meds: . amLODipine  10 mg Oral Daily  . aspirin EC  81 mg Oral Daily  . enoxaparin (LOVENOX) injection  40 mg Subcutaneous QHS  . feeding supplement  1 Container Oral BID WC  . feeding supplement (ENSURE ENLIVE)  237 mL Oral BID BM  . fluticasone  2 spray Each Nare Daily  . insulin aspart  0-15 Units Subcutaneous TID WC  . methylPREDNISolone (SOLU-MEDROL) injection  60 mg Intravenous Q6H  . multivitamin with minerals  1 tablet Oral Daily  . pantoprazole  40 mg Oral BID  . sertraline  100 mg Oral BID  . tamsulosin  0.4 mg Oral QPC breakfast   Continuous Infusions: . doxycycline (VIBRAMYCIN) IV       LOS: 6 days   Time spent: 25 minutes.  Faye Ramsay, MD [facility] Pager [phone]  If 7PM-7AM, please contact night-coverage [email or web address] Password [email or web address] 12/01/2016, 7:07 PM

## 2016-12-01 NOTE — Care Management Important Message (Signed)
Important Message  Patient Details  Name: Brandon Schroeder. MRN: [id number] Date of Birth: 1945/05/25   Medicare Important Message Given:  Yes    Kerin Salen 12/01/2016, 10:13 AMImportant Message  Patient Details  Name: Brandon Schroeder. MRN: [id number] Date of Birth: 1945/08/09   Medicare Important Message Given:  Yes    Kerin Salen 12/01/2016, 10:13 AM

## 2016-12-01 NOTE — Progress Notes (Signed)
PULMONARY / CRITICAL CARE MEDICINE   Name: Brandon Schroeder. MRN: [id number] DOB: [birth year]    ADMISSION DATE:  11/24/2016 CONSULTATION DATE: Brandon Schroeder   REFERRING MD:  Brandon Schroeder / TRH  CHIEF COMPLAINT:  Concern for ILD flare.  HISTORY OF PRESENT ILLNESS:  71 year old Brandon Schroeder who is originally from [place] but has lived in [place] and most recently in [place] but moved to [place], [place] 10 years ago to be near grandchild. 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 Consult has been now been requested for concern for acute flareup of interstitial lung disease.  The patient reports his father died in the late 72s in [place] at [facility] hospital from acute ventilator-dependent respiratory failure following a diagnosis of IPF. Patient himself has never had any respiratory complaints. He leads an active life but is not overtly athletic. For these activities he has never noticed shortness of breath up until 5 or 6 months ago. Wife says this coincides with starting oral methotrexate for psoriatic arthritis. Then approximately in August 2018 after gallbladder surgery the oral methotrexate was switched to subcutaneous methotrexate injection. Some 2 weeks ago the dose was increased. He follows for psoriatic arthritis with Brandon. Leigh Schroeder. Coinciding with this in the last 4 weeks his dyspnea and cough started worsening along with feelings of subjective fever low grade intermittently. Dyspnea is progressed to class III-IV levels. In the emergency room department yesterday was saturating 88% on room air. Since admission he's been on 4 L nasal cannula. He has been afebrile and the sepsis biomarkers pro calcitonin is relatively benign. His lactic acid is normal. White count is normal urine streptococcus is negative all suggesting he does not have a bacterial process. He does not endorse any acute  abrupt decline. He also denies any autoimmune disease. He denies any birds in the house of feathered pillows all mildew. The might be mold in the house. There is no edema   SUBJECTIVE:  RN reports pt continues to require 7L O2, desats with exertion and takes approximately 30 minutes to recover.  Wife requesting a meeting with all MD's present.    VITAL SIGNS: BP 140/85 (BP Location: Right Arm)   Pulse 60   Temp 97.9 F (36.6 C) (Oral)   Resp 20   Ht 6\' 3"  (1.905 m)   Wt 204 lb 8 oz (92.8 kg)   SpO2 95%   BMI 25.56 kg/m   HEMODYNAMICS:    VENTILATOR SETTINGS:    INTAKE / OUTPUT: I/O last 3 completed shifts: In: 480 [P.O.:480] Out: 1400 [Urine:1400]  PHYSICAL EXAMINATION: General: well developed adult male in NAD HEENT: MM pink/moist, no jvd PSY: calm/appropriate  Neuro: AAOx4, speech clear, MAE CV: s1s2 rrr, no m/r/g PULM: even/non-labored, lungs bilaterally with dry crackles L>R 2/3 way up YT:KZSW, non-tender, bsx4 active  Extremities: warm/dry, no edema  Skin: no rashes or lesions  LABS:  BMET  Recent Labs Lab 11/28/16 0500 11/29/16 0610 12/01/16 0623  NA 139 139 139  K 4.5 4.3 4.3  CL 105 106 101  CO2 26 27 30   BUN 28* 35* 36*  CREATININE 1.09 1.11 1.06  GLUCOSE 305* 347* 346*    Electrolytes  Recent Labs Lab 11/27/16 0559 11/28/16 0500 11/29/16 0610 12/01/16 0623  CALCIUM 9.2 9.2 8.8* 8.8*  MG 1.8  --   --   --     CBC  Recent Labs  Lab 11/28/16 0500 11/29/16 0610 12/01/16 0623  WBC 16.3* 14.9* 15.0*  HGB 11.8* 11.5* 12.1*  HCT 34.0* 33.6* 35.1*  PLT 164 153 160    Coag's No results for input(s): APTT, INR in the last 168 hours.  Sepsis Markers  Recent Labs Lab 11/26/16 0003 11/26/16 0314  LATICACIDVEN 1.2 0.7  PROCALCITON <0.10 0.15    ABG No results for input(s): PHART, PCO2ART, PO2ART in the last 168 hours.  Liver Enzymes  Recent Labs Lab 11/20/2016 1811  AST 18  ALT 13*  ALKPHOS 101  BILITOT 0.8  ALBUMIN  3.2*    Cardiac Enzymes  Recent Labs Lab 11/26/16 1330 11/27/16 0559  TROPONINI <0.03 <0.03    Glucose  Recent Labs Lab 11/29/16 2118 11/30/16 0739 11/30/16 1217 11/30/16 1737 11/30/16 2154 12/01/16 0727  GLUCAP 404* 290* 320* 359* 437* 368*    Imaging No results found.  Discussion:  71 year old man with a family history of idiopathic pulmonary fibrosis, followed by Brandon Schroeder for psoriatic arthritis, treated with methotrexate. Has had his methotrexate up titrated over the last several weeks due to right knee arthralgias and ? pseudogouty inflammation. Admitted with dyspnea in the setting of diffuse bilateral alveolar and interstitial infiltrates with patchy appearance, basilar groundglass. He also has risk factors for hypersensitivity pneumonitis with a mold exposure in his home. Differential diagnosis includes methotrexate related lung injury, autoimmune mediated interstitial lung disease due to his psoriatic arthritis, IPF which would be a diagnosis of exclusion. Methotrexate has been stopped. He is now on high-dose steroids. Doubt there is any benefit to transbronchial biopsies to assess for hypersensitivity pneumonitis at this point since he is been on steroids, but BAL for atypical organisms may be helpful. Brandon Schroeder continuing his current therapy with pulsed steroids and then transitioning to maintenance steroids for several weeks. Follow his clinical status and his imaging. I would not restart his methotrexate given the possibility that this was an offending agent. He may ultimately merit bronchoscopy depending on improvement. He may also ultimately require VATS lung biopsy.  ASSESSMENT / PLAN: Acute hypoxemic respiratory failure with bilateral diffuse pulmonary infiltrates Differential diagnosis 1. Undiagnosed baseline IPF with IPF flare: Family history in time sequence can fit in with this particularly with surgery in July 2018 being a risk factor. Around less than 5-10% of  IPF is diagnosed like this 2. Methotrexate related acute hypersensitivity pneumonitis: This is rare but can happen. Patient's developed this in the last 10 years and all of them were in the setting of injection methotrexate. And all these cases it happened shortly after commencing injection methotrexate 3. HSP from another exposure > mold exposure in new home 4. Idiopathic Boop 5. Acute lung injury idiopathic 6. Autoimmune or other vasculitis process in setting psoriatic arthritis 7. Opportunistic infection because of methotrexate: The patient has not been on chronic prednisone and therefore the odds of this is extremely low  OSA - on CPAP at baseline  Chest Pain - doubt cardiac in nature but will reassess EKG to r/o pericarditis. ECHO wnl, prior EKG's as well.    ILD workup: CK: 15 Legionella U antigen >> negative Aldolase >> normal Anti-scleroderma antibody >> negative Sjogrens syndrome B >> negative Sjogrens syndrome A  >> negative GBM >> normal Mpo/pr-3 (anca) >> negative  ANCA titers >> negative ANA w/reflex >> negative Anti-DNA antibody >> negative ACE >> 56 Sed rate: 84 HIV: NR RVP: not detected  ECHO 10/12 >> normal LV size, LVEF 55-60%, normal wall motion, mild to  moderate MVR  Plan: Increase Solumedrol to 60 mg IV Q6 > will need long taper.  Work toward 40 mg PO QD prior to discharge and stay on this until seen by Pulmonary.   Would not restart Methotrexate  O2 as needed to support sats > 90% Change to HFNC O2  Add IS Nasal saline for moisture  PRN percocet for chest pain  Assess EKG Will need ambulatory O2 assessment prior to discharge.  Will ask PT to come back and titrate O2 before exertion.  Mobilize as able  Currently not a candidate for FOB / VATS biopsy due to high O2 needs.   CPAP QHS  Follow intermittent CXR  Doxycycline IV, D7/x abx Follow up with Brandon. Chase Caller (arranged, 10/24)  PT evaluation, appreciate input  Consider follow up HRCT  PCCM will  follow along with you.  PCCM and TRH will meet with wife at 0830 for conference.    Noe Gens, NP-C [facility] Pgr: [phone] or if no answer [phone] 12/01/2016, 9:57 AM

## 2016-12-02 ENCOUNTER — Encounter (HOSPITAL_COMMUNITY): Payer: Self-pay

## 2016-12-02 LAB — GLUCOSE, CAPILLARY
GLUCOSE-CAPILLARY: 383 mg/dL — AB (ref 65–99)
GLUCOSE-CAPILLARY: 479 mg/dL — AB (ref 65–99)
Glucose-Capillary: 270 mg/dL — ABNORMAL HIGH (ref 65–99)
Glucose-Capillary: 511 mg/dL (ref 65–99)

## 2016-12-02 LAB — CREATININE, SERUM
Creatinine, Ser: 1.02 mg/dL (ref 0.61–1.24)
GFR calc non Af Amer: >60 mL/min (ref >60)

## 2016-12-02 LAB — GLUCOSE, RANDOM: Glucose, Bld: 489 mg/dL — ABNORMAL HIGH (ref 65–99)

## 2016-12-02 MED ORDER — INSULIN ASPART 100 UNIT/ML ~~LOC~~ SOLN
3.0000 [IU] | Freq: Three times a day (TID) | SUBCUTANEOUS | Status: DC
  Administered 2016-12-02 – 2016-12-03 (×3): 3 [IU] via SUBCUTANEOUS

## 2016-12-02 MED ORDER — INSULIN ASPART 100 UNIT/ML ~~LOC~~ SOLN
0.0000 [IU] | Freq: Three times a day (TID) | SUBCUTANEOUS | Status: DC
  Administered 2016-12-02 – 2016-12-03 (×2): 20 [IU] via SUBCUTANEOUS
  Administered 2016-12-03: 15 [IU] via SUBCUTANEOUS

## 2016-12-02 MED ORDER — INSULIN ASPART 100 UNIT/ML ~~LOC~~ SOLN
25.0000 [IU] | Freq: Once | SUBCUTANEOUS | Status: AC
  Administered 2016-12-02: 25 [IU] via SUBCUTANEOUS

## 2016-12-02 MED ORDER — INSULIN GLARGINE 100 UNIT/ML ~~LOC~~ SOLN
12.0000 [IU] | Freq: Every day | SUBCUTANEOUS | Status: DC
  Administered 2016-12-02: 12 [IU] via SUBCUTANEOUS
  Filled 2016-12-02 (×2): qty 0.12

## 2016-12-02 NOTE — Progress Notes (Signed)
Physical Therapy Treatment Patient Details Name: Brandon Schroeder. MRN: [id number] DOB: 11/01/1945 Today's Date: 12/02/2016    History of Present Illness 71 year old man with a family history of idiopathic pulmonary fibrosis, followed by Dr. Amil Amen for psoriatic arthritis, treated with methotrexate and admitted for acute pneumonitis in setting of psoriatic arthritis on MTX and pulmonary fibrosis with IPF    PT Comments    VERY LIMITED activity tolerance.  See mobility details below.   Follow Up Recommendations  Home health PT     Equipment Recommendations       Recommendations for Other Services       Precautions / Restrictions Precautions Precautions: Fall Precaution Comments: monitor sats Restrictions Weight Bearing Restrictions: No    Mobility  Bed Mobility Overal bed mobility: Needs Assistance Bed Mobility: Supine to Sit;Sit to Supine     Supine to sit: Min guard;Min assist Sit to supine: Min guard;Min assist   General bed mobility comments: pt on 10 lts high flow nasal at rest supine in bed 88%.  assisted to EOB sats immediately dropped to 63% with noted 3/4 dyspnea.  Pt only able tolerate sitting EOB x 2 min    returned to supine flat   Transfers                 General transfer comment: unable to tolerate any OOB activity  Ambulation/Gait                 Stairs            Wheelchair Mobility    Modified Rankin (Stroke Patients Only)       Balance                                            Cognition Arousal/Alertness: Awake/alert Behavior During Therapy: WFL for tasks assessed/performed Overall Cognitive Status: Within Functional Limits for tasks assessed                                        Exercises      General Comments        Pertinent Vitals/Pain Pain Assessment: Faces Faces Pain Scale: Hurts whole lot Pain Location: chest/ribs "all across'       pt lays flat in bed  for best position and ability to breath Pain Descriptors / Indicators: Pressure Pain Intervention(s): Monitored during session    Home Living                      Prior Function            PT Goals (current goals can now be found in the care plan section) Progress towards PT goals: Progressing toward goals    Frequency    Min 3X/week      PT Plan Current plan remains appropriate    Co-evaluation              AM-PAC PT "6 Clicks" Daily Activity  Outcome Measure  Difficulty turning over in bed (including adjusting bedclothes, sheets and blankets)?: None Difficulty moving from lying on back to sitting on the side of the bed? : None Difficulty sitting down on and standing up from a chair with arms (e.g., wheelchair, bedside commode, etc,.)?: Unable Help needed moving to  and from a bed to chair (including a wheelchair)?: Total   Help needed climbing 3-5 steps with a railing? : Total 6 Click Score: 11    End of Session Equipment Utilized During Treatment: Oxygen Activity Tolerance: Treatment limited secondary to medical complications (Comment) Patient left: in bed;with call bell/phone within reach;with family/visitor present Nurse Communication: Mobility status PT Visit Diagnosis: Difficulty in walking, not elsewhere classified (R26.2)     Time: 2761-4709 PT Time Calculation (min) (ACUTE ONLY): 20 min  Charges:  $Therapeutic Activity: 8-22 mins                    G Codes:       Rica Koyanagi  PTA [facility]  Acute  Rehab Pager      [phone]

## 2016-12-02 NOTE — Progress Notes (Signed)
PROGRESS NOTE  Brandon Schroeder.  RRN:[id number] DOB: [birth year] DOA: 12/15/2016  PCP: Laurey Morale, MD   Brief Narrative:  Patient is 71 year old male with known history of hypertension, depression, presented to emergency department with main concern of several weeks to months duration of progressively worsening dry cough and dyspnea. Patient reports dyspnea was initially present only with exertion but over the past several weeks he has noted significant dyspnea at rest. Patient also reports associated intermittent low-grade fevers and was recently treated with steroids for possible costochondritis followed by Ceftin for presumptive pneumonia that was seen on CT scan. Patient reports compliance with medications but his symptoms have not improved. He was admitted to [facility] for further evaluation.  On arrival to emergency department patient was hypoxic with oxygen saturation 88% on room air and worsening drop with ambulation. CT chest revealed peribronchial vascular opacity, consolidation, questionable background of ILD.ulmonary team was consulted, started steroids and empiric antibiotics.   Hospital course complicated by persistent exertional dyspnea, persistent hypoxia especially with exertion, unable to taper off oxygen via nasal canula.  Assessment & Plan:   Acute hypoxemic respiratory failure with bilateral diffuse pulmonary infiltrates - Etiology remains unclear, ? IPF especially given family history ( patient's father died of IPF), BOOP, Autoimmune versus other vasculitis - ? Methotrexate related acute hypersensitivity pneumonitis (pt on Methotrexate for psoriatic arthritis)  - Current workup including following still pending:  Legionella urinary antigen -neg  Aldolase -normal  Anti scleroderma antibody - neg  Sjogren A - neg  Sjogren B - neg  GBM - normal  MPO/PR-3 - neg  ANCA titers - neg  ANA - negative   anti-DNA antibody  ECHO - per PCCM, increased the  dose of solumedrol to 60 mg IV every 6 hours - complete regimen with ABX but due to patient intolerance to oral antibiotic, changed to IV doxycycline on 10/16 - pt is improving on increased dose and frequency of steroids - management per PCCM   Sleep apnea:  - Continue CPAP qHS  HTN - Continue norvasc - overall stable   Hyperglycemia and Leukocytosis - from steroids - change SSI to resistant coverage   Anxiety/depression - Continue zoloft  Hypokalemia - supplemented   DVT prophylaxis: Lovenox SQ Code Status: Full, confirmed. Family Communication: met with PCCM team and wife to discuss further plan of care Disposition Plan: home when pulmonary team clears  Consultants:   Pulmonology  Procedures:   None  Antimicrobials:  Vancomycin, cefepime 10/10 --> 10/14  Doxycycline 10/14 --> changed to IV on 12/01/2016  Subjective: Pt feels better this am, reports having some sleep last night.   Objective: BP 130/77 (BP Location: Right Arm)   Pulse 61   Temp 98 F (36.7 C) (Oral)   Resp 16   Ht '6\' 3"'  (1.905 m)   Wt 92.8 kg (204 lb 8 oz)   SpO2 91%   BMI 25.56 kg/m    Physical Exam  Constitutional: Appears comfortable this AM  CVS: RRR, S1/S2 +, no murmurs, no gallops, no carotid bruit.  Pulmonary: Effort and breath sounds normal, no stridor, diminished at bases  Abdominal: Soft. BS +,  no distension, tenderness, rebound or guarding.  Musculoskeletal: Normal range of motion. No edema and no tenderness.   Data Reviewed: I have personally reviewed following labs and imaging studies  CBC:  Recent Labs Lab 11/20/2016 1934 11/26/16 0314 11/27/16 0559 11/28/16 0500 11/29/16 0610 12/01/16 0623  WBC 8.9 6.9 9.4 16.3* 14.9* 15.0*  NEUTROABS 6.4 4.7  --   --   --   --   HGB 13.4 11.8* 11.8* 11.8* 11.5* 12.1*  HCT 38.7* 34.3* 34.1* 34.0* 33.6* 35.1*  MCV 93.7 93.5 92.9 91.6 91.3 91.9  PLT 165 146* 147* 164 153 323   Basic Metabolic Panel:  Recent Labs Lab  11/26/16 0314 11/27/16 0559 11/28/16 0500 11/29/16 0610 12/01/16 0623 12/02/16 0559  NA 138 141 139 139 139  --   K 3.3* 4.0 4.5 4.3 4.3  --   CL 103 104 105 106 101  --   CO2 '27 26 26 27 30  ' --   GLUCOSE 138* 276* 305* 347* 346*  --   BUN 13 21* 28* 35* 36*  --   CREATININE 1.05 1.00 1.09 1.11 1.06 1.02  CALCIUM 8.8* 9.2 9.2 8.8* 8.8*  --   MG  --  1.8  --   --   --   --    Liver Function Tests:  Recent Labs Lab 11/27/2016 1811  AST 18  ALT 13*  ALKPHOS 101  BILITOT 0.8  PROT 6.6  ALBUMIN 3.2*   Cardiac Enzymes:  Recent Labs Lab 11/26/16 1330 11/26/16 1333 11/27/16 0559  CKTOTAL  --  15*  --   TROPONINI <0.03  --  <0.03   CBG:  Recent Labs Lab 12/01/16 0727 12/01/16 1121 12/01/16 1718 12/01/16 2211 12/02/16 0748  GLUCAP 368* 226* 424* 490* 270*   Urine analysis:    Component Value Date/Time   COLORURINE YELLOW 08/16/2016 1305   APPEARANCEUR CLEAR 08/16/2016 1305   LABSPEC 1.016 08/16/2016 1305   PHURINE 6.0 08/16/2016 1305   GLUCOSEU >=500 (A) 08/16/2016 1305   HGBUR MODERATE (A) 08/16/2016 1305   BILIRUBINUR NEGATIVE 08/16/2016 1305   BILIRUBINUR n 02/19/2016 1407   KETONESUR 20 (A) 08/16/2016 1305   PROTEINUR 30 (A) 08/16/2016 1305   UROBILINOGEN 0.2 02/19/2016 1407   NITRITE NEGATIVE 08/16/2016 1305   LEUKOCYTESUR NEGATIVE 08/16/2016 1305   Recent Results (from the past 240 hour(s))  Culture, blood (Routine X 2) w Reflex to ID Panel     Status: None   Collection Time: 12/05/2016  8:54 PM  Result Value Ref Range Status   Specimen Description BLOOD RIGHT HAND  Final   Special Requests   Final    IN PEDIATRIC BOTTLE Blood Culture results may not be optimal due to an excessive volume of blood received in culture bottles   Culture   Final    NO GROWTH 5 DAYS Performed at [facility], [place] [address], [place], [place] [address]    Report Status 11/30/2016 FINAL  Final  Culture, blood (Routine X 2) w Reflex to ID Panel     Status: None    Collection Time: 11/28/2016  8:54 PM  Result Value Ref Range Status   Specimen Description BLOOD RIGHT ANTECUBITAL  Final   Special Requests   Final    BOTTLES DRAWN AEROBIC AND ANAEROBIC Blood Culture adequate volume   Culture   Final    NO GROWTH 5 DAYS Performed at [facility], [place] [address] [place], [place] [address]    Report Status 11/30/2016 FINAL  Final  Respiratory Panel by PCR     Status: None   Collection Time: 11/26/16 12:46 PM  Result Value Ref Range Status   Adenovirus NOT DETECTED NOT DETECTED Final   Coronavirus 229E NOT DETECTED NOT DETECTED Final   Coronavirus HKU1 NOT DETECTED NOT DETECTED Final  Coronavirus NL63 NOT DETECTED NOT DETECTED Final   Coronavirus OC43 NOT DETECTED NOT DETECTED Final   Metapneumovirus NOT DETECTED NOT DETECTED Final   Rhinovirus / Enterovirus NOT DETECTED NOT DETECTED Final   Influenza A NOT DETECTED NOT DETECTED Final   Influenza B NOT DETECTED NOT DETECTED Final   Parainfluenza Virus 1 NOT DETECTED NOT DETECTED Final   Parainfluenza Virus 2 NOT DETECTED NOT DETECTED Final   Parainfluenza Virus 3 NOT DETECTED NOT DETECTED Final   Parainfluenza Virus 4 NOT DETECTED NOT DETECTED Final   Respiratory Syncytial Virus NOT DETECTED NOT DETECTED Final   Bordetella pertussis NOT DETECTED NOT DETECTED Final   Chlamydophila pneumoniae NOT DETECTED NOT DETECTED Final   Mycoplasma pneumoniae NOT DETECTED NOT DETECTED Final    Comment: Performed at [facility], [place] [address], [place], [place] [address]     Radiology Studies: No results found.  Scheduled Meds: . amLODipine  10 mg Oral Daily  . aspirin EC  81 mg Oral Daily  . enoxaparin (LOVENOX) injection  40 mg Subcutaneous QHS  . feeding supplement  1 Container Oral BID WC  . feeding supplement (ENSURE ENLIVE)  237 mL Oral BID BM  . fluticasone  2 spray Each Nare Daily  . insulin aspart  0-15 Units Subcutaneous TID WC  . methylPREDNISolone (SOLU-MEDROL) injection  60 mg  Intravenous Q6H  . multivitamin with minerals  1 tablet Oral Daily  . pantoprazole  40 mg Oral BID  . sertraline  100 mg Oral BID  . tamsulosin  0.4 mg Oral QPC breakfast   Continuous Infusions: . doxycycline (VIBRAMYCIN) IV Stopped (12/01/16 2210)     LOS: 7 days   Time spent: 35 minutes.  Faye Ramsay, MD [facility] Pager [phone]  If 7PM-7AM, please contact night-coverage [email or web address] Password [email or web address] 12/02/2016, 10:55 AM

## 2016-12-02 NOTE — Progress Notes (Addendum)
Inpatient Diabetes Program Recommendations  AACE/ADA: New Consensus Statement on Inpatient Glycemic Control (2015)  Target Ranges:  Prepandial:   less than 140 mg/dL      Peak postprandial:   less than 180 mg/dL (1-2 hours)      Critically ill patients:  140 - 180 mg/dL   Lab Results  Component Value Date   GLUCAP 270 (H) 12/02/2016   HGBA1C 6.3 11/20/2016    Review of Glycemic Control  HgbA1C done on 11/20/2016 - 6.3%  CBG 511 prior at 1142. Supplement given at 1037. Supplement given before 2 hours of checking blood sugars + steroids are contributing to hyperglycemia.  Blood sugars continue to be elevated. Needs basal insulin with FBS of 270 mg/dL.   Inpatient Diabetes Program Recommendations:    Add Lantus 12 units QHS. Give supplement after meal instead of before. Change diet to  CHO mod med.  Continue to follow.  Thank you. Lorenda Peck, RD, LDN, CDE Inpatient Diabetes Coordinator [phone]

## 2016-12-02 NOTE — Progress Notes (Signed)
PULMONARY / CRITICAL CARE MEDICINE   Name: Brandon Schroeder. MRN: [id number] DOB: 1945/12/11    ADMISSION DATE:  11/23/2016 CONSULTATION DATE: 11/26/2016  REFERRING MD:  Dr Vance Gather / [facility]  CHIEF COMPLAINT:  Concern for ILD flare.  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  He reports mold exposure in his home.  He has family hx of IPF.  He was found to have changes consistent with pulmonary fibrosis and acute pneumonitis.  SUBJECTIVE:  Still having chest pain.  Breathing feels easier after increasing dose of solumedrol.  Still has O2 drop with activity in the room.  VITAL SIGNS: BP (!) 150/85 (BP Location: Right Arm)   Pulse 61   Temp 98 F (36.7 C) (Oral)   Resp 16   Ht 6\' 3"  (1.905 m)   Wt 204 lb 8 oz (92.8 kg)   SpO2 91%   BMI 25.56 kg/m   INTAKE / OUTPUT: I/O last 3 completed shifts: In: 410 [P.O.:410] Out: 3425 [Urine:3425]  PHYSICAL EXAMINATION:  General - pleasant Eyes - pupils reactive ENT - no sinus tenderness, no oral exudate, no LAN Cardiac - regular, no murmur Chest - basilar crackles, no wheeze Abd - soft, non tender Ext - no edema Skin - no rashes Neuro - normal strength Psych - normal mood  LABS:  BMET  Recent Labs Lab 11/28/16 0500 11/29/16 0610 12/01/16 0623 12/02/16 0559  NA 139 139 139  --   K 4.5 4.3 4.3  --   CL 105 106 101  --   CO2 26 27 30   --   BUN 28* 35* 36*  --   CREATININE 1.09 1.11 1.06 1.02  GLUCOSE 305* 347* 346*  --     Electrolytes  Recent Labs Lab 11/27/16 0559 11/28/16 0500 11/29/16 0610 12/01/16 0623  CALCIUM 9.2 9.2 8.8* 8.8*  MG 1.8  --   --   --     CBC  Recent Labs Lab 11/28/16 0500 11/29/16 0610 12/01/16 0623  WBC 16.3* 14.9* 15.0*  HGB 11.8* 11.5* 12.1*  HCT 34.0* 33.6* 35.1*  PLT 164 153 160   Sepsis Markers  Recent Labs Lab 11/26/16 0003 11/26/16 0314   LATICACIDVEN 1.2 0.7  PROCALCITON <0.10 0.15   Liver Enzymes  Recent Labs Lab 11/22/2016 1811  AST 18  ALT 13*  ALKPHOS 101  BILITOT 0.8  ALBUMIN 3.2*    Cardiac Enzymes  Recent Labs Lab 11/26/16 1330 11/27/16 0559  TROPONINI <0.03 <0.03    Glucose  Recent Labs Lab 11/30/16 2154 12/01/16 0727 12/01/16 1121 12/01/16 1718 12/01/16 2211 12/02/16 0748  GLUCAP 437* 368* 226* 424* 490* 270*    Imaging No results found.  ILD workup: CK: 15 Legionella U antigen >> negative Aldolase >> normal Anti-scleroderma antibody >> negative Sjogrens syndrome B >> negative Sjogrens syndrome A  >> negative GBM >> normal Mpo/pr-3 (anca) >> negative  ANCA titers >> negative ANA w/reflex >> negative Anti-DNA antibody >> negative ACE >> 56 Sed rate: 84 HIV: NR RVP: not detected  ECHO 10/12 >> normal LV size, LVEF 55-60%, normal wall motion, mild to moderate MVR  DISCUSSION: 71 yo male likely has underlying pulmonary fibrosis that was undiagnosed now with acute pneumonitis.  Main concern is lung toxicity from methotrexate therapy for psoriatic arthritis.  The methotrexate was d/c'ed about 2 weeks prior to admission.  ASSESSMENT / PLAN:  Acute hypoxic respiratory failure with acute pneumonitis with underlying pulmonary fibrosis. - continue solumedrol 60 mg q6h - oxygen to keep SpO2 90 to 95% - plan to get HRCT chest toward end of this week and compare to 11/18/2016 scan - day 8 of Abx, currently on doxycycline  Chest pain. - unclear what the cause of this is, but appears musculoskeletal - continue prn analgesics per primary team  Follow up arranged with Dr. Chase Caller for 10/24 >> might need to change this date depending on when he might be ready for discharge.  Had family meeting with pt's wife and Dr. Doyle Askew.  Brandon Mires, MD [facility] Pulmonary/Critical Care 12/02/2016, 9:19 AM Pager:  [phone] After 3pm call: [phone]

## 2016-12-02 NOTE — Progress Notes (Signed)
Pt stated that he was not going to utilize CPAP tonight.  RT to monitor and assess as needed.

## 2016-12-03 DIAGNOSIS — Z7189 Other specified counseling: Secondary | ICD-10-CM

## 2016-12-03 LAB — BASIC METABOLIC PANEL
Anion gap: 11 (ref 5–15)
BUN: 37 mg/dL — AB (ref 6–20)
CHLORIDE: 96 mmol/L — AB (ref 101–111)
CO2: 31 mmol/L (ref 22–32)
Calcium: 8.8 mg/dL — ABNORMAL LOW (ref 8.9–10.3)
Creatinine, Ser: 1.02 mg/dL (ref 0.61–1.24)
GFR calc non Af Amer: >60 mL/min (ref >60)
Glucose, Bld: 304 mg/dL — ABNORMAL HIGH (ref 65–99)
POTASSIUM: 4.2 mmol/L (ref 3.5–5.1)
Sodium: 138 mmol/L (ref 135–145)

## 2016-12-03 LAB — GLUCOSE, CAPILLARY
GLUCOSE-CAPILLARY: 303 mg/dL — AB (ref 65–99)
GLUCOSE-CAPILLARY: 299 mg/dL — AB (ref 65–99)
GLUCOSE-CAPILLARY: 158 mg/dL — AB (ref 65–99)
GLUCOSE-CAPILLARY: 154 mg/dL — AB (ref 65–99)
Glucose-Capillary: 434 mg/dL — ABNORMAL HIGH (ref 65–99)
Glucose-Capillary: 454 mg/dL — ABNORMAL HIGH (ref 65–99)
Glucose-Capillary: 343 mg/dL — ABNORMAL HIGH (ref 65–99)

## 2016-12-03 LAB — CBC
HCT: 38 % — ABNORMAL LOW (ref 39.0–52.0)
HEMOGLOBIN: 12.8 g/dL — AB (ref 13.0–17.0)
MCH: 31.3 pg (ref 26.0–34.0)
MCHC: 33.7 g/dL (ref 30.0–36.0)
MCV: 92.9 fL (ref 78.0–100.0)
Platelets: 155 10*3/uL (ref 150–400)
RBC: 4.09 MIL/uL — AB (ref 4.22–5.81)
RDW: 13.6 % (ref 11.5–15.5)
WBC: 17.3 10*3/uL — ABNORMAL HIGH (ref 4.0–10.5)

## 2016-12-03 LAB — HEMOGLOBIN A1C
HEMOGLOBIN A1C: 7.1 % — AB (ref 4.8–5.6)
MEAN PLASMA GLUCOSE: 157.07 mg/dL

## 2016-12-03 LAB — BLOOD GAS, ARTERIAL
ACID-BASE EXCESS: 8.4 mmol/L — AB (ref 0.0–2.0)
BICARBONATE: 32.1 mmol/L — AB (ref 20.0–28.0)
DRAWN BY: 257701
O2 CONTENT: 12 L/min
O2 SAT: 84.2 %
PATIENT TEMPERATURE: 98.6
PO2 ART: 48.7 mmHg — AB (ref 83.0–108.0)
pCO2 arterial: 41.3 mmHg (ref 32.0–48.0)
pH, Arterial: 7.502 — ABNORMAL HIGH (ref 7.350–7.450)

## 2016-12-03 LAB — MRSA PCR SCREENING: MRSA by PCR: NEGATIVE

## 2016-12-03 MED ORDER — INSULIN ASPART 100 UNIT/ML ~~LOC~~ SOLN
10.0000 [IU] | Freq: Once | SUBCUTANEOUS | Status: AC
  Administered 2016-12-03: 10 [IU] via INTRAVENOUS

## 2016-12-03 MED ORDER — LORAZEPAM 2 MG/ML IJ SOLN
0.5000 mg | INTRAMUSCULAR | Status: DC | PRN
  Administered 2016-12-03 (×2): 1 mg via INTRAVENOUS
  Filled 2016-12-03 (×3): qty 1

## 2016-12-03 MED ORDER — ORAL CARE MOUTH RINSE
15.0000 mL | Freq: Two times a day (BID) | OROMUCOSAL | Status: DC
  Administered 2016-12-03: 15 mL via OROMUCOSAL

## 2016-12-03 MED ORDER — INSULIN ASPART 100 UNIT/ML ~~LOC~~ SOLN: 0.0000 [IU] | Freq: Every day | SUBCUTANEOUS | Status: DC

## 2016-12-03 MED ORDER — INSULIN ASPART 100 UNIT/ML ~~LOC~~ SOLN
0.0000 [IU] | SUBCUTANEOUS | Status: DC
  Administered 2016-12-04: 11 [IU] via SUBCUTANEOUS
  Administered 2016-12-04 (×2): 4 [IU] via SUBCUTANEOUS
  Administered 2016-12-04: 15 [IU] via SUBCUTANEOUS
  Administered 2016-12-04: 7 [IU] via SUBCUTANEOUS
  Administered 2016-12-04: 11 [IU] via SUBCUTANEOUS

## 2016-12-03 MED ORDER — FAMOTIDINE 20 MG PO TABS
40.0000 mg | ORAL_TABLET | Freq: Every day | ORAL | Status: DC
  Administered 2016-12-03: 40 mg via ORAL
  Filled 2016-12-03 (×2): qty 2

## 2016-12-03 MED ORDER — OXYCODONE-ACETAMINOPHEN 5-325 MG PO TABS
1.0000 | ORAL_TABLET | Freq: Four times a day (QID) | ORAL | Status: DC
  Administered 2016-12-03 – 2016-12-07 (×14): 1 via ORAL
  Filled 2016-12-03 (×16): qty 1

## 2016-12-03 MED ORDER — INSULIN GLARGINE 100 UNIT/ML ~~LOC~~ SOLN
18.0000 [IU] | Freq: Every day | SUBCUTANEOUS | Status: DC
  Filled 2016-12-03: qty 0.18

## 2016-12-03 MED ORDER — LIP MEDEX EX OINT
TOPICAL_OINTMENT | CUTANEOUS | Status: AC
  Administered 2016-12-03: 23:00:00
  Filled 2016-12-03: qty 7

## 2016-12-03 MED ORDER — FUROSEMIDE 10 MG/ML IJ SOLN
40.0000 mg | Freq: Once | INTRAMUSCULAR | Status: AC
  Administered 2016-12-03: 40 mg via INTRAVENOUS
  Filled 2016-12-03: qty 4

## 2016-12-03 MED ORDER — INSULIN ASPART 100 UNIT/ML ~~LOC~~ SOLN: 5.0000 [IU] | Freq: Three times a day (TID) | SUBCUTANEOUS | Status: DC

## 2016-12-03 MED ORDER — METHYLPREDNISOLONE SODIUM SUCC 125 MG IJ SOLR
125.0000 mg | Freq: Four times a day (QID) | INTRAMUSCULAR | Status: DC
  Administered 2016-12-03 – 2016-12-04 (×6): 125 mg via INTRAVENOUS
  Filled 2016-12-03 (×7): qty 2

## 2016-12-03 MED ORDER — INSULIN ASPART 100 UNIT/ML ~~LOC~~ SOLN: 0.0000 [IU] | Freq: Three times a day (TID) | SUBCUTANEOUS | Status: DC

## 2016-12-03 MED ORDER — INSULIN ASPART 100 UNIT/ML ~~LOC~~ SOLN
20.0000 [IU] | Freq: Once | SUBCUTANEOUS | Status: AC
  Administered 2016-12-03: 20 [IU] via SUBCUTANEOUS

## 2016-12-03 MED ORDER — MELATONIN 5 MG PO TABS: 15.0000 mg | ORAL_TABLET | Freq: Every day | ORAL | Status: DC

## 2016-12-03 NOTE — Progress Notes (Signed)
Condom catheter placed. 40mg  lasix given per order. Patient aware of need to conserve energy. Oxygen saturation 90% on 10L highflow. Pt appears to be more comfortable at this time. Will continue to monitor

## 2016-12-03 NOTE — Progress Notes (Signed)
PULMONARY / CRITICAL CARE MEDICINE   Name: Brandon Schroeder. MRN: [id number] DOB: 08-11-1945    ADMISSION DATE:  12/08/2016 CONSULTATION DATE: 11/26/2016  REFERRING MD:  Dr Brandon Schroeder / [facility]  CHIEF COMPLAINT:  Concern for ILD flare.  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  He reports mold exposure in his home.  He has family hx of IPF.  He was found to have changes consistent with pulmonary fibrosis and acute pneumonitis.   SUBJECTIVE:  Pt reports he had an episode of shortness of breath this am (0400).  He woke and had a difficult time breathing, O2 was increased to 15L temporarily.  He was given a breathing treatment and ABG assessed.  The patient feels he has recovered.  He continues to have chest pain that crosses both sides of his chest.  It is worse with pressure (NP pushed down on pts chest).  It has been constant and relieved some with percocet.  He has been able to sit up more this am than the prior days.  No difficulty with eating/swallowing. States he has been anxious.   VITAL SIGNS: BP 129/85 (BP Location: Right Arm)   Pulse 63   Temp 98.4 F (36.9 C) (Oral)   Resp (!) 31   Ht 6\' 3"  (1.905 m)   Wt 204 lb 8 oz (92.8 kg)   SpO2 (!) 85%   BMI 25.56 kg/m   INTAKE / OUTPUT: I/O last 3 completed shifts: In: 1820 [P.O.:1320; IV Piggyback:500] Out: 2625 [Urine:2625]  PHYSICAL EXAMINATION: General: well developed adult male in NAD, sitting up in bed  HEENT: MM pink/moist, no jvd PSY: calm/appropriate Neuro: AAOx4, speech clear, MAE  CV: s1s2 rrr, no m/r/g PULM: even/non-labored, lungs bilaterally clear anterior, Velcro like crackles posterior lower 2/3 TF:TDDU, non-tender, bsx4 active  Extremities: warm/dry, no edema  Skin: no rashes or lesions  LABS:  BMET  Recent Labs Lab 11/29/16 0610 12/01/16 0623 12/02/16 0559 12/02/16 1245  12/03/16 0655  NA 139 139  --   --  138  K 4.3 4.3  --   --  4.2  CL 106 101  --   --  96*  CO2 27 30  --   --  31  BUN 35* 36*  --   --  37*  CREATININE 1.11 1.06 1.02  --  1.02  GLUCOSE 347* 346*  --  489* 304*    Electrolytes  Recent Labs Lab 11/27/16 0559  11/29/16 0610 12/01/16 0623 12/03/16 0655  CALCIUM 9.2  < > 8.8* 8.8* 8.8*  MG 1.8  --   --   --   --   < > = values in this interval not displayed.  CBC  Recent Labs Lab 11/29/16 0610 12/01/16 0623 12/03/16 0655  WBC 14.9* 15.0* 17.3*  HGB 11.5* 12.1* 12.8*  HCT 33.6* 35.1* 38.0*  PLT 153 160 155   Sepsis Markers No results for input(s): LATICACIDVEN, PROCALCITON, O2SATVEN in the last 168 hours.   Liver Enzymes No results for input(s): AST, ALT, ALKPHOS, BILITOT, ALBUMIN in the last 168 hours.  Cardiac Enzymes  Recent Labs Lab 11/26/16 1330 11/27/16 0559  TROPONINI <0.03 <0.03    Glucose  Recent Labs Lab 12/01/16 2211 12/02/16 0748 12/02/16 1142 12/02/16 1635 12/02/16 2111 12/03/16 0810  GLUCAP 490* 270* 511* 383* 479* 303*    Imaging No  results found.  ILD workup: CK: 15 Legionella U antigen >> negative Aldolase >> normal Anti-scleroderma antibody >> negative Sjogrens syndrome B >> negative Sjogrens syndrome A  >> negative GBM >> normal Mpo/pr-3 (anca) >> negative  ANCA titers >> negative ANA w/reflex >> negative Anti-DNA antibody >> negative ACE >> 56 Sed rate: 84 HIV: NR RVP: not detected  ECHO 10/12 >> normal LV size, LVEF 55-60%, normal wall motion, mild to moderate MVR  DISCUSSION: 71 y/o male likely has underlying pulmonary fibrosis that was undiagnosed now with acute pneumonitis.  Main concern is lung toxicity from methotrexate therapy for psoriatic arthritis.  The methotrexate was d/c'ed about 2 weeks prior to admission.  ASSESSMENT / PLAN:  Acute hypoxic respiratory failure with acute pneumonitis with underlying pulmonary fibrosis. DDx: Undiagnosed baseline  IPF with acute flare, methotrexate related acute hypersensitivity pneumonitis, HSP, BOOP, ALI, autoimmune process in the setting of psoriatic arthritis, mild edema and opportunistic infection.  Suspect this is an IPF flare given he is not responding to steroids.  Plan: Increase solumedrol 125 mg IV Q6 and hold (10/18, discussed with Dr. Chase Schroeder) O2 to keep sats > 88% HRCT in am 10/19 Minimize exertion / bed rest  D9/x abx, currently on IV doxycycline  Transfer to SDU for observation  Lasix 40 mg IV x1   Chest pain - unclear etiology, appears musculoskeletal  Plan: Schedule percocet for now   OSA  Plan: Continue QHS CPAP   GERD Plan: Continue PPI BID  Add Pepcid/H2 blocker 40mg  QHS    GOC  Reviewed patients oxygen needs and care thus far with patient and wife.  We discussed that he does not appear to be responding to steroids thus far.  His wife brought up the idea of transfer to [facility] but she is not wanting to pursue immediately.  We discussed that he would be very high risk for transport and that even if transferred if biopsy was pursued, it would likely commit him to mechanical ventilation.  We reviewed that there is a "sweet spot" for potential transfer and if he is to worsen, we might not be able to transfer safely.  They are going to discuss the concept of intubation and if he would want that type of support.    Global - Follow up arranged with Dr. Chase Schroeder for 10/24 >> might need to change this date depending on when he might be ready for discharge.  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  Patient continues to require HFNC and his demand for O2 has increased overnight.  On exam, diffuse crackles noted.   I reviewed chest CT myself, significant fibrosis noted.  Discussed with PCCM-NP and [facility]-MD.     Acute on chronic hypoxemic respiratory failure:  - Titrate O2 for sat of 88-92%.  - Transfer to the ICU for evolving respiratory failure  Fluid overload:  - Lasix ordered  - BMET in AM  IPF:  - Increase solumedrol to 125 mg IV q6  - Allow for 3 days of high dose steroids.  GOC:  Discussed code status at length, concern is that if intubated patient will never come of the vent.  They family wishes to discuss this a little longer.  Will revisit in AM.  Hold in ICU overnight.  The patient is critically ill with multiple  organ systems failure and requires high complexity decision making for assessment and support, frequent evaluation and titration of therapies, application of advanced monitoring technologies and extensive interpretation of multiple databases.   Critical Care Time devoted to patient care services described in this note is  35  Minutes. This time reflects time of care of this signee Dr Jennet Maduro. This critical care time does not reflect procedure time, or teaching time or supervisory time of PA/NP/Med student/Med Resident etc but could involve care discussion time.  Rush Farmer, M.D. [facility] Pulmonary/Critical Care Medicine. Pager: [phone]. After hours pager: [phone].

## 2016-12-03 NOTE — Progress Notes (Signed)
Nutrition Follow-up  DOCUMENTATION CODES:   Severe malnutrition in context of acute illness/injury  INTERVENTION:   -D/c Boost Breeze supplement given elevated blood sugars -Continue Ensure Enlive po BID + Lactaid Milk, each supplement provides 350 kcal and 20 grams of protein  RD will continue to monitor  NUTRITION DIAGNOSIS:   Malnutrition (severe)related to acute illness, poor appetite (HCAP) as evidenced by percent weight loss, energy intake < or equal to 50% for > or equal to 5 days, moderate depletions of muscle mass.  Ongoing.  GOAL:   Patient will meet greater than or equal to 90% of their needs  Not meeting.  MONITOR:   PO intake, Supplement acceptance, Labs, Weight trends, I & O's  ASSESSMENT:   71 y.o. male with medical history significant for nterstitial lung disease, depression with anxiety, and hypertension, now presenting to the emergency department for evaluation of shortness of breath and cough. Patient reports upper respiratory symptoms and a cough for about a month now, and was being evaluated by his PCP for this. He was treated initially with a steroid taper, had no improvement, and is now completing a course of Ceftin, but continues to worsen. He reported intermittent fevers at home and a CT scan of the chest was obtained as an outpatient. There was concern for a pneumonia on the CT, and the patient was directed to the ED for further evaluation. In addition to the patient's dyspnea, he has also complained of some lightheadedness, but no syncope. No abdominal pain, nausea, or vomiting. Had some diarrhea recently, but now resolved. No recent travel or sick contacts. Had cholecystectomy in July 2018.   Patient with elevated CBGs d/t steroid doses, highest lab was 511 mg/dL.  Last PO intake documented was from 10/16. RD will discontinue Boost Breeze supplements and leave Ensure supplements given severe malnutrition and poor PO intakes PTA.  Will continue to  monitor for improvements in blood sugar control.  Medications: Multivitamin with minerals daily, Protonix tablet BID Labs reviewed: CBGs: 303-479 HgbA1c: 7.1  Diet Order:  Diet Carb Modified Fluid consistency: Thin; Room service appropriate? Yes  Skin:  Reviewed, no issues  Last BM:  10/11  Height:   Ht Readings from Last 1 Encounters:  11/26/16 6\' 3"  (1.905 m)    Weight:   Wt Readings from Last 1 Encounters:  11/26/16 204 lb 8 oz (92.8 kg)    Ideal Body Weight:  89.1 kg  BMI:  Body mass index is 25.56 kg/m.  Estimated Nutritional Needs:   Kcal:  2100-2300  Protein:  110-120g  Fluid:  2.1L/day  EDUCATION NEEDS:   Education needs addressed  Clayton Bibles, MS, RD, LDN [place] [facility] Pager: [phone] After Hours Pager: [phone]

## 2016-12-03 NOTE — Progress Notes (Signed)
IV inserted.  Cbg rechecked at 299.  Dr. Titus Mould notified.

## 2016-12-03 NOTE — Progress Notes (Signed)
[place] Progress Note Patient Name: Brandon Schroeder. DOB: 1946/02/06 MRN: [id number]   Date of Service  12/03/2016  HPI/Events of Note  Hypoxia, ph alk, no sig WOB,  Pt feels like he needs some extra support sometimes On 15 liters  eICU Interventions  cpap 10, may need BIPAP but not now with normal WOB and ph 7.5     Intervention Category Major Interventions: Hypoxemia - evaluation and management  FEINSTEIN,DANIEL J. 12/03/2016, 7:42 PM

## 2016-12-03 NOTE — Progress Notes (Signed)
Dr. Titus Mould notified.  Pt SPO2 83.  Pt stated "I'm having a hard time breathging.  I am dizzy."   Orders received to try Cpap.

## 2016-12-03 NOTE — Progress Notes (Signed)
Date:  December 03, 2016 Chart reviewed for concurrent status and case management needs.  Will continue to follow patient progress.  Discharge Planning: following for needs  Expected discharge date: December 06, 2016  Velva Harman, BSN, [place], [place]

## 2016-12-03 NOTE — Progress Notes (Signed)
Dr. Titus Mould notified of pts CBG one hour after IV insulin administered at 158.  Orders to recheck in 30 min.

## 2016-12-03 NOTE — Progress Notes (Signed)
Dr. Titus Mould notified of pts cbg of 154.  Ok to continue with q 4 hr checks/ coverage.

## 2016-12-03 NOTE — Progress Notes (Signed)
Pt feeling very agitated with the cpap.  No relief with the morphine Francee Gentile given.  Dr. Titus Mould notified.  RT placed 100 nonrebreather and Hiflow nasal canula on.

## 2016-12-03 NOTE — Progress Notes (Signed)
PROGRESS NOTE  Brandon Schroeder.  RFF:[id number] DOB: [birth year] DOA: 11/26/2016  PCP: Laurey Morale, MD   Brief Narrative:  Patient is 71 year old male with known history of hypertension, depression, presented to emergency department with main concern of several weeks to months duration of progressively worsening dry cough and dyspnea. Patient reports dyspnea was initially present only with exertion but over the past several weeks he has noted significant dyspnea at rest. Patient also reports associated intermittent low-grade fevers and was recently treated with steroids for possible costochondritis followed by Ceftin for presumptive pneumonia that was seen on CT scan. Patient reports compliance with medications but his symptoms have not improved. He was admitted to [facility] for further evaluation.  On arrival to emergency department patient was hypoxic with oxygen saturation 88% on room air and worsening drop with ambulation. CT chest revealed peribronchial vascular opacity, consolidation, questionable background of ILD.ulmonary team was consulted, started steroids and empiric antibiotics.   Hospital course complicated by persistent exertional dyspnea, persistent hypoxia especially with exertion, unable to taper off oxygen via nasal canula.  Assessment & Plan:   Acute hypoxemic respiratory failure with bilateral diffuse pulmonary infiltrates - Etiology remains unclear, ? IPF especially given family history ( patient's father died of IPF), BOOP, Autoimmune versus other vasculitis - ? Methotrexate related acute hypersensitivity pneumonitis (pt on Methotrexate for psoriatic arthritis)  - Current workup including following still pending:  Legionella urinary antigen -neg  Aldolase -normal  Anti scleroderma antibody - neg  Sjogren A - neg  Sjogren B - neg  GBM - normal  MPO/PR-3 - neg  ANCA titers - neg  ANA - negative   anti-DNA antibody  ECHO ef 55-60% - per PCCM,  increased the dose of solumedrol to 125 mg IV every 6 hours - complete regimen with ABX but due to patient intolerance to oral antibiotic, changed to IV doxycycline on 10/16 - remain significant hypoxia, he received iv lasix on 10/18, transfer  To icu -  ongoing discussion with patient and family about possible intubation, plan per pccm  Hyperglycemia and Leukocytosis - from steroids - change SSI to resistant coverage , start lantus, increase meal coverage  Sleep apnea:  - Continue CPAP qHS  HTN - Continue norvasc - overall stable    Anxiety/depression - Continue zoloft  Hypokalemia - supplemented   DVT prophylaxis: Lovenox SQ Code Status: Full, confirmed. Family Communication: patient Disposition Plan: not ready to discharge, he is transferred to icu due to respiratory distress  Consultants:   Pulmonology  Procedures:   None  Antimicrobials:  Vancomycin, cefepime 10/10 --> 10/14  Doxycycline 10/14 --> changed to IV on 12/01/2016  Subjective: Remain sob on high low oxygen  Objective: BP (!) 157/97   Pulse 64   Temp 97.8 F (36.6 C) (Oral)   Resp (!) 32   Ht 6\' 3"  (1.905 m)   Wt 92.8 kg (204 lb 8 oz)   SpO2 (!) 89%   BMI 25.56 kg/m    Physical Exam  Constitutional: Appears comfortable at rest this afternoon CVS: RRR, S1/S2 +, no murmurs, no gallops, no carotid bruit.  Pulmonary: Effort and breath sounds normal, no stridor, diminished at bases  Abdominal: Soft. BS +,  no distension, tenderness, rebound or guarding.  Musculoskeletal: Normal range of motion. No edema and no tenderness.   Data Reviewed: I have personally reviewed following labs and imaging studies  CBC:  Recent Labs Lab 11/27/16 0559 11/28/16 0500 11/29/16 3570 12/01/16 1779 12/03/16 3903  WBC 9.4 16.3* 14.9* 15.0* 17.3*  HGB 11.8* 11.8* 11.5* 12.1* 12.8*  HCT 34.1* 34.0* 33.6* 35.1* 38.0*  MCV 92.9 91.6 91.3 91.9 92.9  PLT 147* 164 153 160 350   Basic Metabolic  Panel:  Recent Labs Lab 11/27/16 0559 11/28/16 0500 11/29/16 0610 12/01/16 0623 12/02/16 0559 12/02/16 1245 12/03/16 0655  NA 141 139 139 139  --   --  138  K 4.0 4.5 4.3 4.3  --   --  4.2  CL 104 105 106 101  --   --  96*  CO2 26 26 27 30   --   --  31  GLUCOSE 276* 305* 347* 346*  --  489* 304*  BUN 21* 28* 35* 36*  --   --  37*  CREATININE 1.00 1.09 1.11 1.06 1.02  --  1.02  CALCIUM 9.2 9.2 8.8* 8.8*  --   --  8.8*  MG 1.8  --   --   --   --   --   --    Liver Function Tests: No results for input(s): AST, ALT, ALKPHOS, BILITOT, PROT, ALBUMIN in the last 168 hours. Cardiac Enzymes:  Recent Labs Lab 11/27/16 0559  TROPONINI <0.03   CBG:  Recent Labs Lab 12/02/16 1142 12/02/16 1635 12/02/16 2111 12/03/16 0810 12/03/16 1649  GLUCAP 511* 383* 479* 303* 434*   Urine analysis:    Component Value Date/Time   COLORURINE YELLOW 08/16/2016 1305   APPEARANCEUR CLEAR 08/16/2016 1305   LABSPEC 1.016 08/16/2016 1305   PHURINE 6.0 08/16/2016 1305   GLUCOSEU >=500 (A) 08/16/2016 1305   HGBUR MODERATE (A) 08/16/2016 1305   BILIRUBINUR NEGATIVE 08/16/2016 1305   BILIRUBINUR n 02/19/2016 1407   KETONESUR 20 (A) 08/16/2016 1305   PROTEINUR 30 (A) 08/16/2016 1305   UROBILINOGEN 0.2 02/19/2016 1407   NITRITE NEGATIVE 08/16/2016 1305   LEUKOCYTESUR NEGATIVE 08/16/2016 1305   Recent Results (from the past 240 hour(s))  Culture, blood (Routine X 2) w Reflex to ID Panel     Status: None   Collection Time: 11/17/2016  8:54 PM  Result Value Ref Range Status   Specimen Description BLOOD RIGHT HAND  Final   Special Requests   Final    IN PEDIATRIC BOTTLE Blood Culture results may not be optimal due to an excessive volume of blood received in culture bottles   Culture   Final    NO GROWTH 5 DAYS Performed at [facility], [place] [address] [place], [place] [address]    Report Status 11/30/2016 FINAL  Final  Culture, blood (Routine X 2) w Reflex to ID Panel     Status: None    Collection Time: 11/19/2016  8:54 PM  Result Value Ref Range Status   Specimen Description BLOOD RIGHT ANTECUBITAL  Final   Special Requests   Final    BOTTLES DRAWN AEROBIC AND ANAEROBIC Blood Culture adequate volume   Culture   Final    NO GROWTH 5 DAYS Performed at [facility], [place] [address], [place], [place] [address]    Report Status 11/30/2016 FINAL  Final  Respiratory Panel by PCR     Status: None   Collection Time: 11/26/16 12:46 PM  Result Value Ref Range Status   Adenovirus NOT DETECTED NOT DETECTED Final   Coronavirus 229E NOT DETECTED NOT DETECTED Final   Coronavirus HKU1 NOT DETECTED NOT DETECTED Final   Coronavirus NL63 NOT DETECTED NOT DETECTED Final   Coronavirus OC43 NOT  DETECTED NOT DETECTED Final   Metapneumovirus NOT DETECTED NOT DETECTED Final   Rhinovirus / Enterovirus NOT DETECTED NOT DETECTED Final   Influenza A NOT DETECTED NOT DETECTED Final   Influenza B NOT DETECTED NOT DETECTED Final   Parainfluenza Virus 1 NOT DETECTED NOT DETECTED Final   Parainfluenza Virus 2 NOT DETECTED NOT DETECTED Final   Parainfluenza Virus 3 NOT DETECTED NOT DETECTED Final   Parainfluenza Virus 4 NOT DETECTED NOT DETECTED Final   Respiratory Syncytial Virus NOT DETECTED NOT DETECTED Final   Bordetella pertussis NOT DETECTED NOT DETECTED Final   Chlamydophila pneumoniae NOT DETECTED NOT DETECTED Final   Mycoplasma pneumoniae NOT DETECTED NOT DETECTED Final    Comment: Performed at [facility], [place] [address], [place], [place] [address]  MRSA PCR Screening     Status: None   Collection Time: 12/03/16 12:17 PM  Result Value Ref Range Status   MRSA by PCR NEGATIVE NEGATIVE Final    Comment:        The GeneXpert MRSA Assay (FDA approved for NASAL specimens only), is one component of a comprehensive MRSA colonization surveillance program. It is not intended to diagnose MRSA infection nor to guide or monitor treatment for MRSA infections.      Radiology  Studies: No results found.  Scheduled Meds: . amLODipine  10 mg Oral Daily  . aspirin EC  81 mg Oral Daily  . enoxaparin (LOVENOX) injection  40 mg Subcutaneous QHS  . famotidine  40 mg Oral QHS  . feeding supplement (ENSURE ENLIVE)  237 mL Oral BID BM  . fluticasone  2 spray Each Nare Daily  . [START ON 12/04/2016] insulin aspart  0-20 Units Subcutaneous TID WC  . insulin aspart  0-5 Units Subcutaneous QHS  . [START ON 12/04/2016] insulin aspart  5 Units Subcutaneous TID WC  . insulin glargine  18 Units Subcutaneous QHS  . mouth rinse  15 mL Mouth Rinse BID  . methylPREDNISolone (SOLU-MEDROL) injection  125 mg Intravenous Q6H  . multivitamin with minerals  1 tablet Oral Daily  . oxyCODONE-acetaminophen  1 tablet Oral Q6H  . pantoprazole  40 mg Oral BID  . sertraline  100 mg Oral BID  . tamsulosin  0.4 mg Oral QPC breakfast   Continuous Infusions: . doxycycline (VIBRAMYCIN) IV Stopped (12/03/16 1015)     LOS: 8 days   Time spent: 35 minutes. Case discussed with pccm, RN, diabetes coordinator.   Florencia Reasons, MD PhD [facility] Pager [phone]  If 7PM-7AM, please contact night-coverage [email or web address] Password [facility] 12/03/2016, 5:39 PM

## 2016-12-03 NOTE — Progress Notes (Signed)
Ok to go ahead and give IV insulin per Dr. Titus Mould.

## 2016-12-03 NOTE — Progress Notes (Signed)
Inpatient Diabetes Program Recommendations  AACE/ADA: New Consensus Statement on Inpatient Glycemic Control (2015)  Target Ranges:  Prepandial:   less than 140 mg/dL      Peak postprandial:   less than 180 mg/dL (1-2 hours)      Critically ill patients:  140 - 180 mg/dL   Lab Results  Component Value Date   GLUCAP 303 (H) 12/03/2016   HGBA1C 7.1 (H) 12/03/2016    Review of Glycemic Control  FBS elevated. Needs insulin adjustment.  Inpatient Diabetes Program Recommendations:    Increase Lantus to 18 units QHS Increase Novolog to 5 units tidwc and hs  Continue to follow.  Thank you. Lorenda Peck, RD, LDN, CDE Inpatient Diabetes Coordinator [phone]

## 2016-12-03 NOTE — Consult Note (Signed)
   [facility] CM Inpatient Consult   12/03/2016  Adoni Greenough. 1945-12-01 [id number]   [facility] Care Management follow up.  Went to bedside in room 1503. However, Probation officer informed by nursing that Mr. Vencill was transferred to ICU.   Will follow up at later time to engage for potential [place] Management services.    Marthenia Rolling, MSN-Ed, RN,BSN [facility] Liaison [phone]

## 2016-12-03 NOTE — Progress Notes (Signed)
Po2 on ABG (12 lpm HFNC)= 48.7 (sp02 84.2). RT increased HFNC to 15 lpm- RN aware.

## 2016-12-03 NOTE — Progress Notes (Signed)
Dr Erlinda Hong called for CBG 424, orders received for 23 units Novolog. Repeat CBG called for CBG 454 and orders received for 20 units Novolog. Will recheck CBG 8pm.

## 2016-12-03 NOTE — Progress Notes (Signed)
Dr. Titus Mould notified of pts CBG 358.  Orders received to give IV insulin and then call in one hour.  Current IV clotted.  Will restart IV and then administer Insulin.  Dr. Titus Mould aware

## 2016-12-03 NOTE — Progress Notes (Signed)
Pt states he is feeling much better with current Oxygen administration.  Appears more relaxed and resting.

## 2016-12-03 NOTE — Progress Notes (Signed)
[place] Progress Note Patient Name: Brandon Schroeder. DOB: [birth year] MRN: [id number]   Date of Service  12/03/2016  HPI/Events of Note  Glu 400 NPO needed for hypoxia  Change to res ssi Dose IV x 1 now Repeat glu in 1 hour  eICU Interventions       Intervention Category Major Interventions: Hyperglycemia - active titration of insulin therapy  Raylene Miyamoto. 12/03/2016, 8:13 PM

## 2016-12-03 NOTE — Progress Notes (Signed)
PHARMACIST - PHYSICIAN ORDER COMMUNICATION  CONCERNING: P&T Medication Policy on Herbal Medications  DESCRIPTION:  This patient's order for: melatonin  has been noted.  This product(s) is classified as an "herbal" or natural product. Due to a lack of definitive safety studies or FDA approval, nonstandard manufacturing practices, plus the potential risk of unknown drug-drug interactions while on inpatient medications, the Pharmacy and Therapeutics Committee does not permit the use of "herbal" or natural products of this type within [facility].   ACTION TAKEN: The pharmacy department is unable to verify this order at this time. Please reevaluate patient's clinical condition at discharge and address if the herbal or natural product(s) should be resumed at that time.  Clayburn Pert, PharmD, BCPS Pager: [phone] 12/03/2016  9:59 AM

## 2016-12-04 ENCOUNTER — Inpatient Hospital Stay (HOSPITAL_COMMUNITY): Payer: PPO

## 2016-12-04 DIAGNOSIS — J810 Acute pulmonary edema: Secondary | ICD-10-CM

## 2016-12-04 LAB — GLUCOSE, CAPILLARY
GLUCOSE-CAPILLARY: 364 mg/dL — AB (ref 65–99)
Glucose-Capillary: 185 mg/dL — ABNORMAL HIGH (ref 65–99)
Glucose-Capillary: 198 mg/dL — ABNORMAL HIGH (ref 65–99)
Glucose-Capillary: 219 mg/dL — ABNORMAL HIGH (ref 65–99)
Glucose-Capillary: 263 mg/dL — ABNORMAL HIGH (ref 65–99)
Glucose-Capillary: 268 mg/dL — ABNORMAL HIGH (ref 65–99)
Glucose-Capillary: 333 mg/dL — ABNORMAL HIGH (ref 65–99)

## 2016-12-04 LAB — BASIC METABOLIC PANEL
Anion gap: 10 (ref 5–15)
BUN: 39 mg/dL — ABNORMAL HIGH (ref 6–20)
CHLORIDE: 92 mmol/L — AB (ref 101–111)
CO2: 33 mmol/L — AB (ref 22–32)
CREATININE: 1 mg/dL (ref 0.61–1.24)
Calcium: 8.5 mg/dL — ABNORMAL LOW (ref 8.9–10.3)
GFR calc non Af Amer: >60 mL/min (ref >60)
Glucose, Bld: 220 mg/dL — ABNORMAL HIGH (ref 65–99)
Potassium: 4.3 mmol/L (ref 3.5–5.1)
Sodium: 135 mmol/L (ref 135–145)

## 2016-12-04 LAB — MAGNESIUM: Magnesium: 1.8 mg/dL (ref 1.7–2.4)

## 2016-12-04 MED ORDER — OXYCODONE HCL 5 MG PO TABS
5.0000 mg | ORAL_TABLET | ORAL | Status: DC | PRN
  Administered 2016-12-05 – 2016-12-06 (×3): 5 mg via ORAL
  Filled 2016-12-04 (×3): qty 1

## 2016-12-04 MED ORDER — ORAL CARE MOUTH RINSE
15.0000 mL | Freq: Two times a day (BID) | OROMUCOSAL | Status: DC
  Administered 2016-12-04: 15 mL via OROMUCOSAL

## 2016-12-04 MED ORDER — INSULIN GLARGINE 100 UNIT/ML ~~LOC~~ SOLN
5.0000 [IU] | Freq: Every day | SUBCUTANEOUS | Status: DC
  Administered 2016-12-04: 5 [IU] via SUBCUTANEOUS
  Filled 2016-12-04 (×2): qty 0.05

## 2016-12-04 MED ORDER — FUROSEMIDE 10 MG/ML IJ SOLN
40.0000 mg | Freq: Once | INTRAMUSCULAR | Status: AC
  Administered 2016-12-04: 40 mg via INTRAVENOUS
  Filled 2016-12-04: qty 4

## 2016-12-04 MED ORDER — FUROSEMIDE 10 MG/ML IJ SOLN
40.0000 mg | Freq: Every day | INTRAMUSCULAR | Status: DC
  Filled 2016-12-04: qty 4

## 2016-12-04 NOTE — Progress Notes (Signed)
Inpatient Diabetes Program Recommendations  AACE/ADA: New Consensus Statement on Inpatient Glycemic Control (2015)  Target Ranges:  Prepandial:   less than 140 mg/dL      Peak postprandial:   less than 180 mg/dL (1-2 hours)      Critically ill patients:  140 - 180 mg/dL   Lab Results  Component Value Date   GLUCAP 268 (H) 12/04/2016   HGBA1C 7.1 (H) 12/03/2016    Review of Glycemic Control  NPO in ICU at present.  Inpatient Diabetes Program Recommendations:   ICU Hyperglycemia Protocol.  Continue to follow.  Thank you. Lorenda Peck, RD, LDN, CDE Inpatient Diabetes Coordinator [phone]

## 2016-12-04 NOTE — Progress Notes (Signed)
Pt stable and comfortable on 15Lpm green [place] HFNC and 100% NRB.  Pt states that this is very comfortable for him and he does not feel like he is working to breath as hard.   RT will continue to monitor Pt for increased WOB.

## 2016-12-04 NOTE — Progress Notes (Signed)
PROGRESS NOTE  [name].  ZOX:[id number] DOB: [birth year] DOA: 11/24/2016  PCP: Laurey Morale, MD   Brief Narrative:  Patient is 71 year old male with known history of hypertension, depression, presented to emergency department with main concern of several weeks to months duration of progressively worsening dry cough and dyspnea. Patient reports dyspnea was initially present only with exertion but over the past several weeks he has noted significant dyspnea at rest. Patient also reports associated intermittent low-grade fevers and was recently treated with steroids for possible costochondritis followed by Ceftin for presumptive pneumonia that was seen on CT scan. Patient reports compliance with medications but his symptoms have not improved. He was admitted to [facility] for further evaluation.  On arrival to emergency department patient was hypoxic with oxygen saturation 88% on room air and worsening drop with ambulation. CT chest revealed peribronchial vascular opacity, consolidation, questionable background of ILD.ulmonary team was consulted, started steroids and empiric antibiotics.   Hospital course complicated by persistent exertional dyspnea, persistent hypoxia especially with exertion, unable to taper off oxygen via nasal canula.  Assessment & Plan:   Acute hypoxemic respiratory failure with bilateral diffuse pulmonary infiltrates - Etiology remains unclear, ? IPF especially given family history ( patient's father died of IPF), BOOP, Autoimmune versus other vasculitis - ? Methotrexate related acute hypersensitivity pneumonitis (pt on Methotrexate for psoriatic arthritis)  - Current workup including following still pending:  Legionella urinary antigen -neg  Aldolase -normal  Anti scleroderma antibody - neg  Sjogren A - neg  Sjogren B - neg  GBM - normal  MPO/PR-3 - neg  ANCA titers - neg  ANA - negative   anti-DNA antibody  ECHO ef 55-60% - per PCCM,  increased the dose of solumedrol to 125 mg IV every 6 hours - complete regimen with ABX but due to patient intolerance to oral antibiotic, changed to IV doxycycline on 10/16 - remain significant hypoxia, he received iv lasix on 10/18, transfer  To icu -not improving, ct chest today -  ongoing discussion with patient and family about possible intubation, plan per pccm  Hyperglycemia and Leukocytosis - from steroids - change SSI to resistant coverage , start lantus, increase meal coverage  Sleep apnea:  - Continue CPAP qHS  HTN - Continue norvasc - overall stable    Anxiety/depression - Continue zoloft  Hypokalemia - supplemented   DVT prophylaxis: Lovenox SQ Code Status: Full, confirmed. Family Communication: patient and wife at bedside Disposition Plan: not ready to discharge, remain in icu due to respiratory distress  Consultants:   Pulmonology critical care  Procedures:   None  Antimicrobials:  Vancomycin, cefepime 10/10 --> 10/14  Doxycycline 10/14 --> changed to IV on 12/01/2016  Subjective: Remain sob on high low oxygen, confused at times last night  On high dose steroids, blood sugar elevated, continue adjust insulin Wife at bedside  Objective: BP (!) 141/85   Pulse 70   Temp 97.7 F (36.5 C) (Oral)   Resp (!) 28   Ht 6\' 3"  (1.905 m)   Wt 96.1 kg (211 lb 13.8 oz)   SpO2 90%   BMI 26.48 kg/m    Physical Exam  Constitutional: on  NRB, report chest pain, som cough, currently not in acute distress at rest CVS: RRR, S1/S2 +, no murmurs, no gallops, no carotid bruit.  Pulmonary: Effort and breath sounds normal, no stridor, diminished at bases  Abdominal: Soft. BS +,  no distension, tenderness, rebound or guarding.  Musculoskeletal: Normal range  of motion. No edema and no tenderness.   Data Reviewed: I have personally reviewed following labs and imaging studies  CBC:  Recent Labs Lab 11/28/16 0500 11/29/16 0610 12/01/16 0623 12/03/16 0655    WBC 16.3* 14.9* 15.0* 17.3*  HGB 11.8* 11.5* 12.1* 12.8*  HCT 34.0* 33.6* 35.1* 38.0*  MCV 91.6 91.3 91.9 92.9  PLT 164 153 160 161   Basic Metabolic Panel:  Recent Labs Lab 11/28/16 0500 11/29/16 0610 12/01/16 0623 12/02/16 0559 12/02/16 1245 12/03/16 0655 12/04/16 0348  NA 139 139 139  --   --  138 135  K 4.5 4.3 4.3  --   --  4.2 4.3  CL 105 106 101  --   --  96* 92*  CO2 26 27 30   --   --  31 33*  GLUCOSE 305* 347* 346*  --  489* 304* 220*  BUN 28* 35* 36*  --   --  37* 39*  CREATININE 1.09 1.11 1.06 1.02  --  1.02 1.00  CALCIUM 9.2 8.8* 8.8*  --   --  8.8* 8.5*  MG  --   --   --   --   --   --  1.8   Liver Function Tests: No results for input(s): AST, ALT, ALKPHOS, BILITOT, PROT, ALBUMIN in the last 168 hours. Cardiac Enzymes: No results for input(s): CKTOTAL, CKMB, CKMBINDEX, TROPONINI in the last 168 hours. CBG:  Recent Labs Lab 12/03/16 2042 12/03/16 2224 12/03/16 2254 12/04/16 0009 12/04/16 0325  GLUCAP 299* 158* 154* 185* 198*   Urine analysis:    Component Value Date/Time   COLORURINE YELLOW 08/16/2016 1305   APPEARANCEUR CLEAR 08/16/2016 1305   LABSPEC 1.016 08/16/2016 1305   PHURINE 6.0 08/16/2016 1305   GLUCOSEU >=500 (A) 08/16/2016 1305   HGBUR MODERATE (A) 08/16/2016 1305   BILIRUBINUR NEGATIVE 08/16/2016 1305   BILIRUBINUR n 02/19/2016 1407   KETONESUR 20 (A) 08/16/2016 1305   PROTEINUR 30 (A) 08/16/2016 1305   UROBILINOGEN 0.2 02/19/2016 1407   NITRITE NEGATIVE 08/16/2016 1305   LEUKOCYTESUR NEGATIVE 08/16/2016 1305   Recent Results (from the past 240 hour(s))  Culture, blood (Routine X 2) w Reflex to ID Panel     Status: None   Collection Time: 11/30/2016  8:54 PM  Result Value Ref Range Status   Specimen Description BLOOD RIGHT HAND  Final   Special Requests   Final    IN PEDIATRIC BOTTLE Blood Culture results may not be optimal due to an excessive volume of blood received in culture bottles   Culture   Final    NO GROWTH 5  DAYS Performed at [facility], [place] [address], [place], [place] [address]    Report Status 11/30/2016 FINAL  Final  Culture, blood (Routine X 2) w Reflex to ID Panel     Status: None   Collection Time: 11/18/2016  8:54 PM  Result Value Ref Range Status   Specimen Description BLOOD RIGHT ANTECUBITAL  Final   Special Requests   Final    BOTTLES DRAWN AEROBIC AND ANAEROBIC Blood Culture adequate volume   Culture   Final    NO GROWTH 5 DAYS Performed at [facility], [place] [address] [place], [place] [address]    Report Status 11/30/2016 FINAL  Final  Respiratory Panel by PCR     Status: None   Collection Time: 11/26/16 12:46 PM  Result Value Ref Range Status   Adenovirus NOT DETECTED NOT DETECTED  Final   Coronavirus 229E NOT DETECTED NOT DETECTED Final   Coronavirus HKU1 NOT DETECTED NOT DETECTED Final   Coronavirus NL63 NOT DETECTED NOT DETECTED Final   Coronavirus OC43 NOT DETECTED NOT DETECTED Final   Metapneumovirus NOT DETECTED NOT DETECTED Final   Rhinovirus / Enterovirus NOT DETECTED NOT DETECTED Final   Influenza A NOT DETECTED NOT DETECTED Final   Influenza B NOT DETECTED NOT DETECTED Final   Parainfluenza Virus 1 NOT DETECTED NOT DETECTED Final   Parainfluenza Virus 2 NOT DETECTED NOT DETECTED Final   Parainfluenza Virus 3 NOT DETECTED NOT DETECTED Final   Parainfluenza Virus 4 NOT DETECTED NOT DETECTED Final   Respiratory Syncytial Virus NOT DETECTED NOT DETECTED Final   Bordetella pertussis NOT DETECTED NOT DETECTED Final   Chlamydophila pneumoniae NOT DETECTED NOT DETECTED Final   Mycoplasma pneumoniae NOT DETECTED NOT DETECTED Final    Comment: Performed at [facility], [place] [address], [place], [place] [address]  MRSA PCR Screening     Status: None   Collection Time: 12/03/16 12:17 PM  Result Value Ref Range Status   MRSA by PCR NEGATIVE NEGATIVE Final    Comment:        The GeneXpert MRSA Assay (FDA approved for NASAL specimens only), is one  component of a comprehensive MRSA colonization surveillance program. It is not intended to diagnose MRSA infection nor to guide or monitor treatment for MRSA infections.      Radiology Studies: No results found.  Scheduled Meds: . amLODipine  10 mg Oral Daily  . aspirin EC  81 mg Oral Daily  . enoxaparin (LOVENOX) injection  40 mg Subcutaneous QHS  . famotidine  40 mg Oral QHS  . feeding supplement (ENSURE ENLIVE)  237 mL Oral BID BM  . fluticasone  2 spray Each Nare Daily  . insulin aspart  0-20 Units Subcutaneous Q4H  . mouth rinse  15 mL Mouth Rinse BID  . mouth rinse  15 mL Mouth Rinse BID  . methylPREDNISolone (SOLU-MEDROL) injection  125 mg Intravenous Q6H  . multivitamin with minerals  1 tablet Oral Daily  . oxyCODONE-acetaminophen  1 tablet Oral Q6H  . pantoprazole  40 mg Oral BID  . sertraline  100 mg Oral BID  . tamsulosin  0.4 mg Oral QPC breakfast   Continuous Infusions: . doxycycline (VIBRAMYCIN) IV Stopped (12/03/16 2153)     LOS: 9 days   Time spent: 25 minutes. Case discussed with pccm, RN.  Florencia Reasons, MD PhD [facility] Pager [phone]  If 7PM-7AM, please contact night-coverage [email or web address] Password [email or web address] 12/04/2016, 7:50 AM

## 2016-12-04 NOTE — Progress Notes (Signed)
PULMONARY / CRITICAL CARE MEDICINE   Name: Brandon Schroeder. MRN: [id number] DOB: Sep 17, 1945    ADMISSION DATE:  12/11/2016 CONSULTATION DATE: 11/26/2016  REFERRING MD:  Dr Vance Gather / [facility]  CHIEF COMPLAINT:  Concern for ILD flare.  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  He reports mold exposure in his home.  He has family hx of IPF.  He was found to have changes consistent with pulmonary fibrosis and acute pneumonitis.   SUBJECTIVE:  Confusion overnight > stood up, pulled off condom cath / O2.  3L negative with lasix in last 24 hours.   VITAL SIGNS: BP 134/79   Pulse 62   Temp (!) 97.5 F (36.4 C) (Oral)   Resp (!) 22   Ht 6\' 3"  (1.905 m)   Wt 211 lb 13.8 oz (96.1 kg)   SpO2 93%   BMI 26.48 kg/m   INTAKE / OUTPUT: I/O last 3 completed shifts: In: 1030 [P.O.:530; IV Piggyback:500] Out: 5025 [Urine:5025]  PHYSICAL EXAMINATION: General:  Mild respiratory distress HEENT: MM pink/moist PSY: appropriate, alert and interactive Neuro: Alert, moving all ext to command CV: s1s2 rrr, no m/r/g PULM: even/non-labored, lungs bilaterally with crackles PI:RJJO, non-tender, bsx4 active  Extremities: warm/dry, - edema  Skin: no rashes or lesions   LABS:  BMET  Recent Labs Lab 12/01/16 0623 12/02/16 0559 12/02/16 1245 12/03/16 0655 12/04/16 0348  NA 139  --   --  138 135  K 4.3  --   --  4.2 4.3  CL 101  --   --  96* 92*  CO2 30  --   --  31 33*  BUN 36*  --   --  37* 39*  CREATININE 1.06 1.02  --  1.02 1.00  GLUCOSE 346*  --  489* 304* 220*    Electrolytes  Recent Labs Lab 12/01/16 0623 12/03/16 0655 12/04/16 0348  CALCIUM 8.8* 8.8* 8.5*  MG  --   --  1.8    CBC  Recent Labs Lab 11/29/16 0610 12/01/16 0623 12/03/16 0655  WBC 14.9* 15.0* 17.3*  HGB 11.5* 12.1* 12.8*  HCT 33.6* 35.1* 38.0*  PLT 153 160 155   Sepsis  Markers No results for input(s): LATICACIDVEN, PROCALCITON, O2SATVEN in the last 168 hours.   Liver Enzymes No results for input(s): AST, ALT, ALKPHOS, BILITOT, ALBUMIN in the last 168 hours.  Cardiac Enzymes No results for input(s): TROPONINI, PROBNP in the last 168 hours.  Glucose  Recent Labs Lab 12/03/16 2042 12/03/16 2224 12/03/16 2254 12/04/16 0009 12/04/16 0325 12/04/16 0800  GLUCAP 299* 158* 154* 185* 198* 268*    Imaging No results found.  ILD workup: CK: 15 Legionella U antigen >> negative Aldolase >> normal Anti-scleroderma antibody >> negative Sjogrens syndrome B >> negative Sjogrens syndrome A  >> negative GBM >> normal Mpo/pr-3 (anca) >> negative  ANCA titers >> negative ANA w/reflex >> negative Anti-DNA antibody >> negative ACE >> 56 Sed rate: 84 HIV: NR RVP: not detected  ECHO 10/12 >> normal LV size, LVEF 55-60%, normal wall motion, mild to moderate MVR  DISCUSSION: 71 y/o male likely has underlying pulmonary fibrosis that was undiagnosed now with acute pneumonitis.  Main concern is lung toxicity from methotrexate therapy for psoriatic arthritis.  The methotrexate was d/c'ed about 2 weeks prior to admission.  ASSESSMENT / PLAN:  Acute hypoxic  respiratory failure with acute pneumonitis with underlying pulmonary fibrosis. DDx: Undiagnosed baseline IPF with acute flare, methotrexate related acute hypersensitivity pneumonitis, HSP, BOOP, ALI, autoimmune process in the setting of psoriatic arthritis, mild edema and opportunistic infection.  Suspect this is an IPF flare given he is not responding to steroids.  Plan: Increase solumedrol 125 mg IV Q6 and hold (10/18, discussed with Dr. Chase Caller) O2 to keep sats > 88% HRCT in am 10/19 Minimize exertion / bed rest  D9/x abx, currently on IV doxycycline  Transfer to SDU for observation  Lasix 40 mg IV x1   Chest pain - unclear etiology, appears musculoskeletal  Plan: Schedule percocet for  now   OSA  Plan: Continue QHS CPAP   GERD Plan: Continue PPI BID  Add Pepcid/H2 blocker 40mg  QHS    GOC  Reviewed patients oxygen needs and care thus far with patient and wife.  We discussed that he does not appear to be responding to steroids thus far.  His wife brought up the idea of transfer to [facility] but she is not wanting to pursue immediately.  We discussed that he would be very high risk for transport and that even if transferred if biopsy was pursued, it would likely commit him to mechanical ventilation.  We reviewed that there is a "sweet spot" for potential transfer and if he is to worsen, we might not be able to transfer safely.  They are going to discuss the concept of intubation and if he would want that type of support.    Global - Follow up arranged with Dr. Chase Caller for 10/24 >> might need to change this date depending on when he might be ready for discharge.  Noe Gens, NP-C [facility] Pgr: [phone] or if no answer [phone] 12/04/2016, 10:12 AM  Attending Note:  71 year old male with IPF and pulmonary edema, patient was treated with high dose steroids yesterday and diuresed.  Overnight became confused and ripped out IVs.  Today, remains on 100% NRB and in some respiratory distress with crackles remaining.  I reviewed CXR myself, fibrosis noted.  Discussed with PCCM-NP.  We had an extensive discussion about the patient's wishes.  After discussion, he does not wish to be intubated in case of respiratory failure and does not wish to have CPR and cardioversion.  Will change code status to DNR.  Will continue high dose steroids.  Will continue lasix for now.  HRCT ordered for noon today.  PCCM will continue to follow.  The patient is critically ill with multiple organ systems failure and requires high complexity decision making for assessment and support, frequent evaluation and titration of therapies, application of advanced monitoring technologies  and extensive interpretation of multiple databases.   Critical Care Time devoted to patient care services described in this note is  35  Minutes. This time reflects time of care of this signee Dr Jennet Maduro. This critical care time does not reflect procedure time, or teaching time or supervisory time of PA/NP/Med student/Med Resident etc but could involve care discussion time.  Rush Farmer, M.D. [facility] Pulmonary/Critical Care Medicine. Pager: [phone]. After hours pager: [phone].

## 2016-12-04 NOTE — Progress Notes (Signed)
   RT to room to check on pt. who had removed both NRB mask and HFNC with saturations noted to be settled at 79%, no obvious >'d WOB, RT replaced HFNC/refilled left placed at 15 lpm with levelled oxygen saturation at 90% per standing order of 90%/>, RT to monitor.

## 2016-12-04 NOTE — Progress Notes (Signed)
About midnight patient woke up confused, he pulled out his IV and condom catheter. He was sitting by his bedside when staff got to him. He was easily re-oriented and apologized saying he forgot where he was. His oxygen saturation dropped to the low 80s, after coaching him on slow deep breaths it came up to the low 90s. He is currently alert and oriented times 4. IV and condom cath were replaced. I will continue to monitor patient thru this shift.

## 2016-12-04 NOTE — H&P (Addendum)
Pt on HFNC 12LPM and 100% NRB. No distress noted at this time.

## 2016-12-04 NOTE — Progress Notes (Signed)
Called Mrs. Netter as requested to updated her on patients CT imaging.  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  We discussed the mortality surrounding this disease process and that there is no treatment available that would provide a cure.  She understands and is anxious to obtain genetic testing for her family.  I reviewed the findings with Dr. Chase Caller. He is going to connect them with our Dietitian.  I gave the wife my cell phone number.  She has requested that we NOT SHARE this information with the patient until she can be present and help decide on the timing of disclosure.  She is understandably tearful and concerned for her sons and grandchildren.  We will continue to diuresis as there were pleural effusions on CT and support the family / patient as necessary.  We also reviewed palliative care care measures.     Noe Gens, NP-C [facility] Pgr: [phone] or if no answer [phone] 12/04/2016, 4:59 PM   Rush Farmer, M.D. [facility] Pulmonary/Critical Care Medicine. Pager: [phone]. After hours pager: [phone].

## 2016-12-05 DIAGNOSIS — R0602 Shortness of breath: Secondary | ICD-10-CM

## 2016-12-05 DIAGNOSIS — Z515 Encounter for palliative care: Secondary | ICD-10-CM

## 2016-12-05 DIAGNOSIS — Z7189 Other specified counseling: Secondary | ICD-10-CM

## 2016-12-05 LAB — BASIC METABOLIC PANEL
Anion gap: 10 (ref 5–15)
BUN: 47 mg/dL — AB (ref 6–20)
CHLORIDE: 93 mmol/L — AB (ref 101–111)
CO2: 32 mmol/L (ref 22–32)
CREATININE: 1.27 mg/dL — AB (ref 0.61–1.24)
Calcium: 8.4 mg/dL — ABNORMAL LOW (ref 8.9–10.3)
GFR calc Af Amer: >60 mL/min (ref >60)
GFR calc non Af Amer: 56 mL/min — ABNORMAL LOW (ref >60)
GLUCOSE: 440 mg/dL — AB (ref 65–99)
POTASSIUM: 4.1 mmol/L (ref 3.5–5.1)
Sodium: 135 mmol/L (ref 135–145)

## 2016-12-05 LAB — GLUCOSE, CAPILLARY
GLUCOSE-CAPILLARY: 412 mg/dL — AB (ref 65–99)
Glucose-Capillary: 422 mg/dL — ABNORMAL HIGH (ref 65–99)
Glucose-Capillary: 302 mg/dL — ABNORMAL HIGH (ref 65–99)
Glucose-Capillary: 265 mg/dL — ABNORMAL HIGH (ref 65–99)
Glucose-Capillary: 189 mg/dL — ABNORMAL HIGH (ref 65–99)
Glucose-Capillary: 250 mg/dL — ABNORMAL HIGH (ref 65–99)

## 2016-12-05 LAB — MAGNESIUM: Magnesium: 2.1 mg/dL (ref 1.7–2.4)

## 2016-12-05 MED ORDER — LORAZEPAM 1 MG PO TABS
1.0000 mg | ORAL_TABLET | Freq: Once | ORAL | Status: AC
  Administered 2016-12-05: 1 mg via ORAL
  Filled 2016-12-05: qty 1

## 2016-12-05 MED ORDER — INSULIN ASPART 100 UNIT/ML ~~LOC~~ SOLN: 0.0000 [IU] | Freq: Every day | SUBCUTANEOUS | Status: DC

## 2016-12-05 MED ORDER — INSULIN GLARGINE 100 UNIT/ML ~~LOC~~ SOLN
10.0000 [IU] | Freq: Every day | SUBCUTANEOUS | Status: DC
  Administered 2016-12-05: 10 [IU] via SUBCUTANEOUS
  Filled 2016-12-05 (×2): qty 0.1

## 2016-12-05 MED ORDER — FAMOTIDINE 20 MG PO TABS
40.0000 mg | ORAL_TABLET | ORAL | Status: DC
  Administered 2016-12-05: 40 mg via ORAL
  Filled 2016-12-05: qty 2

## 2016-12-05 MED ORDER — INSULIN ASPART 100 UNIT/ML ~~LOC~~ SOLN
8.0000 [IU] | Freq: Once | SUBCUTANEOUS | Status: AC
  Administered 2016-12-05: 8 [IU] via SUBCUTANEOUS

## 2016-12-05 MED ORDER — PREDNISONE 20 MG PO TABS
50.0000 mg | ORAL_TABLET | Freq: Every day | ORAL | Status: DC
  Administered 2016-12-05 – 2016-12-06 (×2): 50 mg via ORAL
  Filled 2016-12-05 (×2): qty 2

## 2016-12-05 MED ORDER — INSULIN ASPART 100 UNIT/ML ~~LOC~~ SOLN
0.0000 [IU] | Freq: Three times a day (TID) | SUBCUTANEOUS | Status: DC
  Administered 2016-12-05: 5 [IU] via SUBCUTANEOUS
  Administered 2016-12-05: 8 [IU] via SUBCUTANEOUS
  Administered 2016-12-05: 11 [IU] via SUBCUTANEOUS
  Administered 2016-12-06: 5 [IU] via SUBCUTANEOUS

## 2016-12-05 MED ORDER — PANTOPRAZOLE SODIUM 40 MG PO TBEC
40.0000 mg | DELAYED_RELEASE_TABLET | Freq: Two times a day (BID) | ORAL | Status: DC
  Administered 2016-12-05 – 2016-12-06 (×2): 40 mg via ORAL
  Filled 2016-12-05 (×2): qty 1

## 2016-12-05 MED ORDER — MORPHINE SULFATE (CONCENTRATE) 10 MG/0.5ML PO SOLN
10.0000 mg | ORAL | Status: DC | PRN
  Administered 2016-12-05 – 2016-12-07 (×16): 10 mg via ORAL
  Filled 2016-12-05 (×17): qty 0.5

## 2016-12-05 MED ORDER — INSULIN ASPART 100 UNIT/ML ~~LOC~~ SOLN
25.0000 [IU] | Freq: Once | SUBCUTANEOUS | Status: AC
  Administered 2016-12-05: 25 [IU] via SUBCUTANEOUS

## 2016-12-05 MED ORDER — INSULIN ASPART 100 UNIT/ML ~~LOC~~ SOLN: 3.0000 [IU] | Freq: Three times a day (TID) | SUBCUTANEOUS | Status: DC

## 2016-12-05 MED ORDER — ENOXAPARIN SODIUM 40 MG/0.4ML ~~LOC~~ SOLN
40.0000 mg | SUBCUTANEOUS | Status: DC
  Administered 2016-12-05: 40 mg via SUBCUTANEOUS
  Filled 2016-12-05: qty 0.4

## 2016-12-05 MED ORDER — SERTRALINE HCL 100 MG PO TABS
100.0000 mg | ORAL_TABLET | Freq: Two times a day (BID) | ORAL | Status: DC
  Administered 2016-12-05 – 2016-12-06 (×2): 100 mg via ORAL
  Filled 2016-12-05 (×2): qty 1

## 2016-12-05 NOTE — Progress Notes (Signed)
Pt refused to wear CPAP throughout the night even with the 4 hour on, 1 hour off plan.  Pt and wife state that his anxiety level will not tolerate CPAP and this is why he has not worn his CPAP at home but very rarely.  Pt requested to and is resting comfortably with 12 Lpm green [place] HFNC and Partial-NRB oxygen mask.  Pt does not has have significant WOB and states "this is the best I've felt yet."  RT will continue to monitor for changes in WOB.

## 2016-12-05 NOTE — Progress Notes (Signed)
PULMONARY / CRITICAL CARE MEDICINE   Name: Brandon Schroeder. MRN: [id number] DOB: 1945/05/14    ADMISSION DATE:  11/21/2016 CONSULTATION DATE: 11/26/2016  REFERRING MD:  Dr Vance Gather / [facility]  CHIEF COMPLAINT:  Concern for ILD flare.  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  He reports mold exposure in his home.  He has family hx of IPF.  He was found to have changes consistent with pulmonary fibrosis and acute pneumonitis.   SUBJECTIVE:  Continues to require high flow O2, c/o chest pain  VITAL SIGNS: BP (!) 136/99 (BP Location: Left Arm)   Pulse 69   Temp 97.6 F (36.4 C) (Oral)   Resp (!) 22   Ht 6\' 3"  (1.905 m)   Wt 96.1 kg (211 lb 13.8 oz)   SpO2 97%   BMI 26.48 kg/m   INTAKE / OUTPUT: I/O last 3 completed shifts: In: 800 [P.O.:50; IV Piggyback:750] Out: 1625 [ATFTD:3220]  PHYSICAL EXAMINATION: General:  Moderate respiratory distress HEENT: [place]/AT, PERRL, EOM-I and MMM PSY: Appropriate, alert and interactive Neuro: Alert, moving all ext to command CV: RRR, Nl S1/S2, -M/R/G. PULM: Labored at times when speaking, diffuse crackles GI: Soft, NT, ND and +BS Extremities: -edema and -tenderness Skin: Intact  LABS:  BMET  Recent Labs Lab 12/03/16 0655 12/04/16 0348 12/05/16 0324  NA 138 135 135  K 4.2 4.3 4.1  CL 96* 92* 93*  CO2 31 33* 32  BUN 37* 39* 47*  CREATININE 1.02 1.00 1.27*  GLUCOSE 304* 220* 440*   Electrolytes  Recent Labs Lab 12/03/16 0655 12/04/16 0348 12/05/16 0324  CALCIUM 8.8* 8.5* 8.4*  MG  --  1.8 2.1   CBC  Recent Labs Lab 11/29/16 0610 12/01/16 0623 12/03/16 0655  WBC 14.9* 15.0* 17.3*  HGB 11.5* 12.1* 12.8*  HCT 33.6* 35.1* 38.0*  PLT 153 160 155   Sepsis Markers No results for input(s): LATICACIDVEN, PROCALCITON, O2SATVEN in the last 168 hours.   Liver Enzymes No results for input(s): AST,  ALT, ALKPHOS, BILITOT, ALBUMIN in the last 168 hours.  Cardiac Enzymes No results for input(s): TROPONINI, PROBNP in the last 168 hours.  Glucose  Recent Labs Lab 12/04/16 1218 12/04/16 1656 12/04/16 1930 12/04/16 2312 12/05/16 0309 12/05/16 0757  GLUCAP 263* 219* 333* 364* 422* 302*    Imaging Ct Chest High Resolution  Result Date: 12/04/2016 CLINICAL DATA:  71 year old male with history of psoriatic arthritis on methotrexate therapy presenting with chest discomfort, cough and progressive dyspnea for the past 5-6 months. EXAM: CT CHEST WITHOUT CONTRAST TECHNIQUE: Multidetector CT imaging of the chest was performed following the standard protocol without intravenous contrast. High resolution imaging of the lungs, as well as inspiratory and expiratory imaging, was performed. COMPARISON:  Chest CT 12/02/2016. FINDINGS: Cardiovascular: Heart size is mildly enlarged. There is no significant pericardial fluid, thickening or pericardial calcification. There is aortic atherosclerosis, as well as atherosclerosis of the great vessels of the mediastinum and the coronary arteries, including calcified atherosclerotic plaque in the left circumflex (likely arising from the proximal RCA taking a retroaortic course (a benign anatomical variant), right and what appears to be an aberrant left anterior descending coronary artery which traverses in the right ventricular outflow tract (a benign anatomical variant) coronary arteries. Dilatation of the pulmonic trunk (3.9 cm in diameter). Mediastinum/Nodes: No pathologically enlarged mediastinal or hilar lymph nodes.  Please note that accurate exclusion of hilar adenopathy is limited on noncontrast CT scans. Esophagus is unremarkable in appearance. No axillary lymphadenopathy. Lungs/Pleura: Compared to the prior examination, high-resolution images demonstrate worsening multifocal S attenuation, septal thickening, subpleural reticulation, thickening of the  peribronchovascular interstitium and mild cylindrical bronchiectasis. There are few areas suspicious for early honeycombing. Inspiratory and expiratory imaging is unremarkable. Small bilateral pleural effusions lying dependently, with some fluid tracking in the superior aspect of the right major fissure. Upper Abdomen: Aortic atherosclerosis.  Status post cholecystectomy. Musculoskeletal: There are no aggressive appearing lytic or blastic lesions noted in the visualized portions of the skeleton. Chronic compression fracture of T12 with approximately 30% loss of anterior vertebral body height. IMPRESSION: 1. Worsening interstitial lung disease. The appearance is unusual. 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 Repeat high-resolution chest CT is recommended in 12 months assess for temporal changes in the appearance of the lung parenchyma. 2. Small bilateral pleural effusions. 3. Aortic atherosclerosis, in addition to 3 vessel coronary artery disease. Please note that although the presence of coronary artery calcium documents the presence of coronary artery disease, the severity of this disease and any potential stenosis cannot be assessed on this non-gated CT examination. Assessment for potential risk factor modification, dietary therapy or pharmacologic therapy may be warranted, if clinically indicated. 4. Notably, the patient has a coronary artery variant. Specifically, there appears to be a single coronary artery arising from the right sinus of Valsalva giving rise to a normal right coronary artery, a left anterior descending which extends anterior to the right ventricular outflow tract before extending into the anterior interventricular groove (a benign anatomical variant), and the left circumflex coronary artery which takes a retroaortic (benign)  course for extending into a normal position in the left atrioventricular groove. 5. Status post cholecystectomy. Aortic Atherosclerosis (ICD10-I70.0). Electronically Signed   By: Vinnie Langton M.D.   On: 12/04/2016 15:51    ILD workup: CK: 15 Legionella U antigen >> negative Aldolase >> normal Anti-scleroderma antibody >> negative Sjogrens syndrome B >> negative Sjogrens syndrome A  >> negative GBM >> normal Mpo/pr-3 (anca) >> negative  ANCA titers >> negative ANA w/reflex >> negative Anti-DNA antibody >> negative ACE >> 56 Sed rate: 84 HIV: NR RVP: not detected  ECHO 10/12 >> normal LV size, LVEF 55-60%, normal wall motion, mild to moderate MVR  I reviewed CT myself, significant deterioration of the pulmonary fibrosis  DISCUSSION: 71 y/o male likely has underlying pulmonary fibrosis that was undiagnosed now with acute pneumonitis.  Main concern is lung toxicity from methotrexate therapy for psoriatic arthritis.  The methotrexate was d/c'ed about 2 weeks prior to admission.  ASSESSMENT / PLAN:  Acute hypoxic respiratory failure with acute pneumonitis with underlying pulmonary fibrosis. DDx: Undiagnosed baseline IPF with acute flare, methotrexate related acute hypersensitivity pneumonitis, HSP, BOOP, ALI, autoimmune process in the setting of psoriatic arthritis, mild edema and opportunistic infection.  Suspect this is an IPF flare given he is not responding to steroids.  Plan: At this point, patient is making no progress, family is refusing IV steroids and patient is refusing BiPAP.  I believe family and patient are ready to discontinue this treatment plan and transition to hospice.  Wife requested [place] [facility] involvement and her only goal is for patient to go home and be comfortable.  I spoke  with Dr. Erlinda Hong as well as Dr. Rowe Pavy from palliative care.  She will be involved and assist with arranging home hospice.  DNR status confirmed.  Will add roxanol and stop IV steroids  and change to PO at the wife's request and do not believe that this will alter plan of care.  Hope is to be able to get concentrators that will be able to do that for the patient.  Chest pain - unclear etiology, appears musculoskeletal  Plan: Percocet and roxanol added   OSA  Plan: Continue QHS CPAP   GERD Plan: Continue PPI BID   GOC  See discussion above.  Transition to palliative mode.  PCCM will sign off, please call back if needed.  The patient is critically ill with multiple organ systems failure and requires high complexity decision making for assessment and support, frequent evaluation and titration of therapies, application of advanced monitoring technologies and extensive interpretation of multiple databases.   Critical Care Time devoted to patient care services described in this note is  35  Minutes. This time reflects time of care of this signee Dr Jennet Maduro. This critical care time does not reflect procedure time, or teaching time or supervisory time of PA/NP/Med student/Med Resident etc but could involve care discussion time.  Rush Farmer, M.D. [facility] Pulmonary/Critical Care Medicine. Pager: [phone]. After hours pager: [phone].

## 2016-12-05 NOTE — Progress Notes (Signed)
Pt wore cpap for approximately two hours before requesting it be taken off.  Pt stated that "it was too much."  Pt placed back on previous o2 devices (prb @15lpm  and 15L hfnc).  HR78, rr21, spo2 90%.  Pt stated he does not want to wear the cpap anymore tonight.  Pt was advised that RT is available all night should he change his mind.  RN aware.

## 2016-12-05 NOTE — Progress Notes (Signed)
PROGRESS NOTE  Brandon Schroeder.  XFG:[id number] DOB: 30-Sep-1945 DOA: 12/09/2016  PCP: Laurey Morale, MD   Brief Narrative:  Patient is 71 year old male with known history of hypertension, depression, presented to emergency department with main concern of several weeks to months duration of progressively worsening dry cough and dyspnea. Patient reports dyspnea was initially present only with exertion but over the past several weeks he has noted significant dyspnea at rest. Patient also reports associated intermittent low-grade fevers and was recently treated with steroids for possible costochondritis followed by Ceftin for presumptive pneumonia that was seen on CT scan. Patient reports compliance with medications but his symptoms have not improved. He was admitted to [facility] for further evaluation.  On arrival to emergency department patient was hypoxic with oxygen saturation 88% on room air and worsening drop with ambulation. CT chest revealed peribronchial vascular opacity, consolidation, questionable background of ILD.ulmonary team was consulted, started steroids and empiric antibiotics.   Hospital course complicated by persistent exertional dyspnea, persistent hypoxia especially with exertion, unable to taper off oxygen via nasal canula.  Assessment & Plan:   Acute hypoxemic respiratory failure with bilateral diffuse pulmonary infiltrates - Etiology remains unclear, ? IPF especially given family history ( patient's father died of IPF), BOOP, Autoimmune versus other vasculitis - ? Methotrexate related acute hypersensitivity pneumonitis (pt on Methotrexate for psoriatic arthritis)  - Current workup   Legionella urinary antigen -neg  Aldolase -normal  Anti scleroderma antibody - neg  Sjogren A - neg  Sjogren B - neg  GBM - normal  MPO/PR-3 - neg  ANCA titers - neg  ANA - negative   anti-DNA antibody  ECHO ef 55-60% - no improvement with several days of high dose  steroids / abx/lasix, repeat ct chest worsening , pccm input appreciated. -patient and family want to change goals of care towards palliative /comfort focused care, palliative care consulted   Hyperglycemia and Leukocytosis - from steroids - change SSI to resistant coverage , start lantus, increase meal coverage Family request to taper off steroids, will need to adjust insulin accordingly   Sleep apnea:  - Continue CPAP qHS  HTN - Continue norvasc - overall stable    Anxiety/depression - Continue zoloft  Hypokalemia - supplemented   DVT prophylaxis: Lovenox SQ Code Status: DNR Family Communication: patient and wife at bedside Disposition Plan: pending palliative care input  Consultants:   Pulmonology critical care  Palliative care  Procedures:   None  Antimicrobials:  Vancomycin, cefepime 10/10 --> 10/14  Doxycycline 10/14 --> changed to IV on 12/01/2016, last dose on 10/20  Subjective: Remain sob on high low oxygen, confused at times last night  Family refused night time meds Wife at bedside  Objective: BP 136/79   Pulse 64   Temp 98.5 F (36.9 C) (Oral)   Resp 19   Ht 6\' 3"  (1.905 m)   Wt 96.1 kg (211 lb 13.8 oz)   SpO2 99%   BMI 26.48 kg/m    Physical Exam  Constitutional: on  NRB, report chest pain, some cough,  CVS: RRR, S1/S2 +, no murmurs, no gallops, no carotid bruit.  Pulmonary: Effort and breath sounds normal, no stridor, diminished at bases  Abdominal: Soft. BS +,  no distension, tenderness, rebound or guarding.  Musculoskeletal: Normal range of motion. No edema and no tenderness.   Data Reviewed: I have personally reviewed following labs and imaging studies  CBC:  Recent Labs Lab 11/29/16 0610 12/01/16 9678 12/03/16 9381  WBC 14.9* 15.0* 17.3*  HGB 11.5* 12.1* 12.8*  HCT 33.6* 35.1* 38.0*  MCV 91.3 91.9 92.9  PLT 153 160 660   Basic Metabolic Panel:  Recent Labs Lab 11/29/16 0610 12/01/16 0623 12/02/16 0559  12/02/16 1245 12/03/16 0655 12/04/16 0348 12/05/16 0324  NA 139 139  --   --  138 135 135  K 4.3 4.3  --   --  4.2 4.3 4.1  CL 106 101  --   --  96* 92* 93*  CO2 27 30  --   --  31 33* 32  GLUCOSE 347* 346*  --  489* 304* 220* 440*  BUN 35* 36*  --   --  37* 39* 47*  CREATININE 1.11 1.06 1.02  --  1.02 1.00 1.27*  CALCIUM 8.8* 8.8*  --   --  8.8* 8.5* 8.4*  MG  --   --   --   --   --  1.8 2.1   Liver Function Tests: No results for input(s): AST, ALT, ALKPHOS, BILITOT, PROT, ALBUMIN in the last 168 hours. Cardiac Enzymes: No results for input(s): CKTOTAL, CKMB, CKMBINDEX, TROPONINI in the last 168 hours. CBG:  Recent Labs Lab 12/04/16 1218 12/04/16 1656 12/04/16 1930 12/04/16 2312 12/05/16 0309  GLUCAP 263* 219* 333* 364* 422*   Urine analysis:    Component Value Date/Time   COLORURINE YELLOW 08/16/2016 1305   APPEARANCEUR CLEAR 08/16/2016 1305   LABSPEC 1.016 08/16/2016 1305   PHURINE 6.0 08/16/2016 1305   GLUCOSEU >=500 (A) 08/16/2016 1305   HGBUR MODERATE (A) 08/16/2016 1305   BILIRUBINUR NEGATIVE 08/16/2016 1305   BILIRUBINUR n 02/19/2016 1407   KETONESUR 20 (A) 08/16/2016 1305   PROTEINUR 30 (A) 08/16/2016 1305   UROBILINOGEN 0.2 02/19/2016 1407   NITRITE NEGATIVE 08/16/2016 1305   LEUKOCYTESUR NEGATIVE 08/16/2016 1305   Recent Results (from the past 240 hour(s))  Culture, blood (Routine X 2) w Reflex to ID Panel     Status: None   Collection Time: 12/03/2016  8:54 PM  Result Value Ref Range Status   Specimen Description BLOOD RIGHT HAND  Final   Special Requests   Final    IN PEDIATRIC BOTTLE Blood Culture results may not be optimal due to an excessive volume of blood received in culture bottles   Culture   Final    NO GROWTH 5 DAYS Performed at [facility], [place] [address] [place],  [address]    Report Status 11/30/2016 FINAL  Final  Culture, blood (Routine X 2) w Reflex to ID Panel     Status: None   Collection Time: 12/06/2016  8:54 PM   Result Value Ref Range Status   Specimen Description BLOOD RIGHT ANTECUBITAL  Final   Special Requests   Final    BOTTLES DRAWN AEROBIC AND ANAEROBIC Blood Culture adequate volume   Culture   Final    NO GROWTH 5 DAYS Performed at [facility], [place] [address] [place],  [address]    Report Status 11/30/2016 FINAL  Final  Respiratory Panel by PCR     Status: None   Collection Time: 11/26/16 12:46 PM  Result Value Ref Range Status   Adenovirus NOT DETECTED NOT DETECTED Final   Coronavirus 229E NOT DETECTED NOT DETECTED Final   Coronavirus HKU1 NOT DETECTED NOT DETECTED Final   Coronavirus NL63 NOT DETECTED NOT DETECTED Final   Coronavirus OC43 NOT DETECTED NOT DETECTED Final   Metapneumovirus NOT DETECTED NOT  DETECTED Final   Rhinovirus / Enterovirus NOT DETECTED NOT DETECTED Final   Influenza A NOT DETECTED NOT DETECTED Final   Influenza B NOT DETECTED NOT DETECTED Final   Parainfluenza Virus 1 NOT DETECTED NOT DETECTED Final   Parainfluenza Virus 2 NOT DETECTED NOT DETECTED Final   Parainfluenza Virus 3 NOT DETECTED NOT DETECTED Final   Parainfluenza Virus 4 NOT DETECTED NOT DETECTED Final   Respiratory Syncytial Virus NOT DETECTED NOT DETECTED Final   Bordetella pertussis NOT DETECTED NOT DETECTED Final   Chlamydophila pneumoniae NOT DETECTED NOT DETECTED Final   Mycoplasma pneumoniae NOT DETECTED NOT DETECTED Final    Comment: Performed at [facility], [place] [address] [place], [place] [address]  MRSA PCR Screening     Status: None   Collection Time: 12/03/16 12:17 PM  Result Value Ref Range Status   MRSA by PCR NEGATIVE NEGATIVE Final    Comment:        The GeneXpert MRSA Assay (FDA approved for NASAL specimens only), is one component of a comprehensive MRSA colonization surveillance program. It is not intended to diagnose MRSA infection nor to guide or monitor treatment for MRSA infections.      Radiology Studies: Ct Chest High  Resolution  Result Date: 12/04/2016 CLINICAL DATA:  71 year old male with history of psoriatic arthritis on methotrexate therapy presenting with chest discomfort, cough and progressive dyspnea for the past 5-6 months. EXAM: CT CHEST WITHOUT CONTRAST TECHNIQUE: Multidetector CT imaging of the chest was performed following the standard protocol without intravenous contrast. High resolution imaging of the lungs, as well as inspiratory and expiratory imaging, was performed. COMPARISON:  Chest CT 11/27/2016. FINDINGS: Cardiovascular: Heart size is mildly enlarged. There is no significant pericardial fluid, thickening or pericardial calcification. There is aortic atherosclerosis, as well as atherosclerosis of the great vessels of the mediastinum and the coronary arteries, including calcified atherosclerotic plaque in the left circumflex (likely arising from the proximal RCA taking a retroaortic course (a benign anatomical variant), right and what appears to be an aberrant left anterior descending coronary artery which traverses in the right ventricular outflow tract (a benign anatomical variant) coronary arteries. Dilatation of the pulmonic trunk (3.9 cm in diameter). Mediastinum/Nodes: No pathologically enlarged mediastinal or hilar lymph nodes. Please note that accurate exclusion of hilar adenopathy is limited on noncontrast CT scans. Esophagus is unremarkable in appearance. No axillary lymphadenopathy. Lungs/Pleura: Compared to the prior examination, high-resolution images demonstrate worsening multifocal S attenuation, septal thickening, subpleural reticulation, thickening of the peribronchovascular interstitium and mild cylindrical bronchiectasis. There are few areas suspicious for early honeycombing. Inspiratory and expiratory imaging is unremarkable. Small bilateral pleural effusions lying dependently, with some fluid tracking in the superior aspect of the right major fissure. Upper Abdomen: Aortic  atherosclerosis.  Status post cholecystectomy. Musculoskeletal: There are no aggressive appearing lytic or blastic lesions noted in the visualized portions of the skeleton. Chronic compression fracture of T12 with approximately 30% loss of anterior vertebral body height. IMPRESSION: 1. Worsening interstitial lung disease. The appearance is unusual. 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 Repeat high-resolution chest CT is recommended in 12 months assess for temporal changes in the appearance of the lung parenchyma. 2. Small bilateral pleural effusions. 3. Aortic atherosclerosis, in addition to 3 vessel coronary artery disease. Please note that although the presence of coronary artery calcium  documents the presence of coronary artery disease, the severity of this disease and any potential stenosis cannot be assessed on this non-gated CT examination. Assessment for potential risk factor modification, dietary therapy or pharmacologic therapy may be warranted, if clinically indicated. 4. Notably, the patient has a coronary artery variant. Specifically, there appears to be a single coronary artery arising from the right sinus of Valsalva giving rise to a normal right coronary artery, a left anterior descending which extends anterior to the right ventricular outflow tract before extending into the anterior interventricular groove (a benign anatomical variant), and the left circumflex coronary artery which takes a retroaortic (benign) course for extending into a normal position in the left atrioventricular groove. 5. Status post cholecystectomy. Aortic Atherosclerosis (ICD10-I70.0). Electronically Signed   By: Vinnie Langton M.D.   On: 12/04/2016 15:51    Scheduled Meds: . amLODipine  10 mg Oral Daily  . aspirin EC  81 mg Oral Daily  . enoxaparin  (LOVENOX) injection  40 mg Subcutaneous QHS  . famotidine  40 mg Oral QHS  . feeding supplement (ENSURE ENLIVE)  237 mL Oral BID BM  . fluticasone  2 spray Each Nare Daily  . furosemide  40 mg Intravenous Daily  . insulin aspart  0-15 Units Subcutaneous TID WC  . insulin aspart  0-5 Units Subcutaneous QHS  . insulin aspart  3 Units Subcutaneous TID WC  . insulin glargine  10 Units Subcutaneous Daily  . mouth rinse  15 mL Mouth Rinse BID  . methylPREDNISolone (SOLU-MEDROL) injection  125 mg Intravenous Q6H  . multivitamin with minerals  1 tablet Oral Daily  . oxyCODONE-acetaminophen  1 tablet Oral Q6H  . pantoprazole  40 mg Oral BID  . sertraline  100 mg Oral BID  . tamsulosin  0.4 mg Oral QPC breakfast   Continuous Infusions: . doxycycline (VIBRAMYCIN) IV Stopped (12/04/16 2049)     LOS: 10 days   Time spent: 25 minutes. Case discussed with pccm, palliative care, RN and patient's wife.  Florencia Reasons, MD PhD [facility] Pager [phone]  If 7PM-7AM, please contact night-coverage [email or web address] Password [email or web address] 12/05/2016, 8:04 AM

## 2016-12-05 NOTE — Progress Notes (Signed)
Patient's CBG-412. Provider notified.Order for 8 U novoLog to be administered given.
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
## 2016-12-05 NOTE — Progress Notes (Signed)
Patient's wife asked that patient's infusing antibiotic be stopped, she did not want patient to be woken up for his 2200 scheduled medications. When asked about scheduled IV steriod she said I should not administer it saying "it does him no good any ways". Patient currently resting in bed, Oxygen saturation 97% on partial NRB and 12L oxygen via HFNC

## 2016-12-06 LAB — GLUCOSE, CAPILLARY: GLUCOSE-CAPILLARY: 238 mg/dL — AB (ref 65–99)

## 2016-12-06 MED ORDER — HYDROMORPHONE HCL 2 MG PO TABS: 2.0000 mg | ORAL_TABLET | ORAL | Status: DC | PRN

## 2016-12-06 MED ORDER — HYDROMORPHONE HCL 1 MG/ML IJ SOLN
1.0000 mg | INTRAMUSCULAR | Status: DC | PRN
  Administered 2016-12-06 – 2016-12-07 (×2): 1 mg via SUBCUTANEOUS
  Filled 2016-12-06 (×2): qty 1

## 2016-12-06 MED ORDER — LORAZEPAM 2 MG/ML PO CONC
1.0000 mg | ORAL | Status: DC | PRN
  Administered 2016-12-06 – 2016-12-07 (×6): 2 mg via ORAL
  Filled 2016-12-06 (×6): qty 1

## 2016-12-06 MED ORDER — SODIUM CHLORIDE 0.9 % IV SOLN
5.0000 mg/h | INTRAVENOUS | Status: DC
  Administered 2016-12-07: 5 mg/h via INTRAVENOUS
  Filled 2016-12-06 (×2): qty 10

## 2016-12-06 MED ORDER — FENTANYL 25 MCG/HR TD PT72
25.0000 ug | MEDICATED_PATCH | TRANSDERMAL | Status: DC
  Administered 2016-12-06: 25 ug via TRANSDERMAL
  Filled 2016-12-06: qty 1

## 2016-12-06 MED ORDER — MORPHINE SULFATE (PF) 4 MG/ML IV SOLN: 2.0000 mg | INTRAVENOUS | Status: DC | PRN

## 2016-12-06 MED ORDER — LIDOCAINE HCL 1 % IJ SOLN
INTRAMUSCULAR | Status: AC
  Administered 2016-12-06: 11:00:00
  Filled 2016-12-06: qty 20

## 2016-12-06 NOTE — Progress Notes (Signed)
Added water to [place].

## 2016-12-06 NOTE — Progress Notes (Signed)
Pt's wife was bedside when I arrived. Pt was resting during most of our visit. Pt's wife wanted to talk about a POA for his possessions and I explained we administer HPOAs, which she understood after I explained. She shared with me how she thought they would have at least another decade together and how his decline was rapid. Pt's wife said she does not know what to do when he passes. She said he did not want to talk about where to be buried - here or [place]. She said he did not want to talk about "any of this" and said when the last test came back she never told him and never asked. She said "he knows". We talked about where she wanted to be and she said she has thought about that and is considering making that her resolve. Pt woke up while we were visiting and she immediately asked him (incorrectly saying Chaplain wanted to know these things) where he wishes for them to be buried. She suggested to him [place] so their son, who lives here, can visit with their grandchildren. Pt ignored his wife's question and said he needed to use the visit. Our visit ended there as she thanked me for coming by. Please page if additional assistance is needed. [place], MDiv   12/06/16 1500  Clinical Encounter Type  Visited With Family;Patient and family together

## 2016-12-06 NOTE — Progress Notes (Signed)
PROGRESS NOTE  [name].  IRS:[id number] DOB: 1945/10/20 DOA: 11/26/2016  PCP: Laurey Morale, MD   Brief Narrative:  Patient is 71 year old male with known history of hypertension, depression, presented to emergency department with main concern of several weeks to months duration of progressively worsening dry cough and dyspnea. Patient reports dyspnea was initially present only with exertion but over the past several weeks he has noted significant dyspnea at rest. Patient also reports associated intermittent low-grade fevers and was recently treated with steroids for possible costochondritis followed by Ceftin for presumptive pneumonia that was seen on CT scan. Patient reports compliance with medications but his symptoms have not improved. He was admitted to [facility] for further evaluation.  On arrival to emergency department patient was hypoxic with oxygen saturation 88% on room air and worsening drop with ambulation. CT chest revealed peribronchial vascular opacity, consolidation, questionable background of ILD.ulmonary team was consulted, started steroids and empiric antibiotics.   Hospital course complicated by persistent exertional dyspnea, persistent hypoxia especially with exertion, unable to taper off oxygen via nasal canula.  Assessment & Plan:   Acute hypoxemic respiratory failure with bilateral diffuse pulmonary infiltrates - Etiology remains unclear, ? IPF especially given family history ( patient's father died of IPF), BOOP, Autoimmune versus other vasculitis - ? Methotrexate related acute hypersensitivity pneumonitis (pt on Methotrexate for psoriatic arthritis)  - Current workup   Legionella urinary antigen -neg  Aldolase -normal  Anti scleroderma antibody - neg  Sjogren A - neg  Sjogren B - neg  GBM - normal  MPO/PR-3 - neg  ANCA titers - neg  ANA - negative   anti-DNA antibody  ECHO ef 55-60% - no improvement with several days of high dose  steroids / abx/lasix, repeat ct chest worsening , pccm input appreciated. -patient and family want to change goals of care towards palliative /comfort focused care, palliative care consulted -on 10/21 family requested case to be discussed with [facility] pulmonologist regarding possible lung transplant. Case discussed with transplant pulmonologist Dr Baxter Flattery at [facility], per Dr Baxter Flattery patient is not a candidate for transplant due to age> 65 and significant ill. Dr Baxter Flattery recommended family to undergo genetic testing. I have informed patient's wife. -patient's care now full comfort, palliative care following.   Hyperglycemia and Leukocytosis - from steroids - change SSI to resistant coverage , start lantus, increase meal coverage Family request to taper off steroids, will need to adjust insulin accordingly  -care now focused on comfort  Sleep apnea:  - Continue CPAP qHS -patient's care now full comfort,   HTN - Continue norvasc - overall stable  -patient's care now full comfort,    Anxiety/depression - Continue zoloft -patient's care now full comfort,     Code Status: DNR Family Communication: patient and wife at bedside Disposition Plan: possible in hospital death  Consultants:   Pulmonology critical care  Palliative care  Procedures:   None  Antimicrobials:  Vancomycin, cefepime 10/10 --> 10/14  Doxycycline 10/14 --> changed to IV on 12/01/2016, last dose on 10/20  Subjective: Remain sob on high low oxygen, confused at times  Family at bedside   Objective: BP (!) 151/92   Pulse 71   Temp 98.3 F (36.8 C) (Oral)   Resp (!) 22   Ht 6\' 3"  (1.905 m)   Wt 90.7 kg (199 lb 15.3 oz)   SpO2 93%   BMI 24.99 kg/m    Physical Exam  Constitutional: on  NRB,  Detail exam deferred,  On full comfort measures    Radiology Studies: Ct Chest High Resolution  Result Date: 12/04/2016 CLINICAL DATA:  71 year old male with history of psoriatic arthritis on methotrexate  therapy presenting with chest discomfort, cough and progressive dyspnea for the past 5-6 months. EXAM: CT CHEST WITHOUT CONTRAST TECHNIQUE: Multidetector CT imaging of the chest was performed following the standard protocol without intravenous contrast. High resolution imaging of the lungs, as well as inspiratory and expiratory imaging, was performed. COMPARISON:  Chest CT 12/16/2016. FINDINGS: Cardiovascular: Heart size is mildly enlarged. There is no significant pericardial fluid, thickening or pericardial calcification. There is aortic atherosclerosis, as well as atherosclerosis of the great vessels of the mediastinum and the coronary arteries, including calcified atherosclerotic plaque in the left circumflex (likely arising from the proximal RCA taking a retroaortic course (a benign anatomical variant), right and what appears to be an aberrant left anterior descending coronary artery which traverses in the right ventricular outflow tract (a benign anatomical variant) coronary arteries. Dilatation of the pulmonic trunk (3.9 cm in diameter). Mediastinum/Nodes: No pathologically enlarged mediastinal or hilar lymph nodes. Please note that accurate exclusion of hilar adenopathy is limited on noncontrast CT scans. Esophagus is unremarkable in appearance. No axillary lymphadenopathy. Lungs/Pleura: Compared to the prior examination, high-resolution images demonstrate worsening multifocal S attenuation, septal thickening, subpleural reticulation, thickening of the peribronchovascular interstitium and mild cylindrical bronchiectasis. There are few areas suspicious for early honeycombing. Inspiratory and expiratory imaging is unremarkable. Small bilateral pleural effusions lying dependently, with some fluid tracking in the superior aspect of the right major fissure. Upper Abdomen: Aortic atherosclerosis.  Status post cholecystectomy. Musculoskeletal: There are no aggressive appearing lytic or blastic lesions noted in the  visualized portions of the skeleton. Chronic compression fracture of T12 with approximately 30% loss of anterior vertebral body height. IMPRESSION: 1. Worsening interstitial lung disease. The appearance is unusual. 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 Repeat high-resolution chest CT is recommended in 12 months assess for temporal changes in the appearance of the lung parenchyma. 2. Small bilateral pleural effusions. 3. Aortic atherosclerosis, in addition to 3 vessel coronary artery disease. Please note that although the presence of coronary artery calcium documents the presence of coronary artery disease, the severity of this disease and any potential stenosis cannot be assessed on this non-gated CT examination. Assessment for potential risk factor modification, dietary therapy or pharmacologic therapy may be warranted, if clinically indicated. 4. Notably, the patient has a coronary artery variant. Specifically, there appears to be a single coronary artery arising from the right sinus of Valsalva giving rise to a normal right coronary artery, a left anterior descending which extends anterior to the right ventricular outflow tract before extending into the anterior interventricular groove (a benign anatomical variant), and the left circumflex coronary artery which takes a retroaortic (benign) course for extending into a normal position in the left atrioventricular groove. 5. Status post cholecystectomy. Aortic Atherosclerosis (ICD10-I70.0). Electronically Signed   By: Vinnie Langton M.D.   On: 12/04/2016 15:51    Scheduled Meds: . amLODipine  10 mg Oral Daily  . aspirin EC  81 mg Oral Daily  . famotidine  40 mg Oral Q24H  . feeding supplement (ENSURE ENLIVE)  237 mL Oral BID BM  . fentaNYL  25 mcg Transdermal Q72H  . fluticasone  2 spray  Each Nare Daily  .  insulin aspart  3 Units Subcutaneous TID WC  . insulin glargine  10 Units Subcutaneous Daily  . multivitamin with minerals  1 tablet Oral Daily  . oxyCODONE-acetaminophen  1 tablet Oral Q6H  . pantoprazole  40 mg Oral Q12H  . predniSONE  50 mg Oral Q breakfast  . sertraline  100 mg Oral Q12H  . tamsulosin  0.4 mg Oral QPC breakfast   Continuous Infusions: . morphine Stopped (12/06/16 1200)     LOS: 11 days   Time spent: 35 minutes. Case discussed with [facility] transplant pulmonologist Dr Baxter Flattery over the phone Case discussed with pccm, palliative care, RN and patient's wife.  Florencia Reasons, MD PhD [facility] Pager [phone]  If 7PM-7AM, please contact night-coverage [email or web address] Password [email or web address] 12/06/2016, 2:38 PM

## 2016-12-06 NOTE — Progress Notes (Signed)
PMT progress note  Patient seen and examined. Overnight events noted, chart reviewed, discussed with [facility] MD Dr Erlinda Hong. Discussed in separate family meeting with wife and son as well.   Patient is awake, resting in bed, holding onto his pillow, his O2 requirements are high Urine is becoming more dark and concentrated.   BP (!) 151/92   Pulse 71   Temp 98.3 F (36.8 C) (Oral)   Resp (!) 22   Ht 6\' 3"  (1.905 m)   Wt 90.7 kg (199 lb 15.3 oz)   SpO2 93%   BMI 24.99 kg/m  No new labs or imaging.   Awake, not alert.  Diffuse crackles No edema S1 S2 regular.  Abdomen soft Eyes open, does not verbalize much  A/P: 71 yo male with hx of psoriatic arthritis on MTX presented with chest discomfort, cough and progressive dyspnea over 5 to 6 months.  He reports mold exposure in his home.  He has family hx of IPF.  He was found to have changes consistent with pulmonary fibrosis and acute pneumonitis.   GOALS: Plan of care is now, to focus exclusively on comfort measures. There is no IV access, patient and family don't want him to be stuck again to attempt to gain IV access.   Hence, we discussed about safe PO and SL medication routes, the patient is still taking POs.  1. Dilaudid PO PRN.  2. Ativan PO PRN .  3. Will start Fentanyl patch.   Comfort cart for family, end of life signs and symptoms discussed extensively with family.   The family is requesting SQ route be made available, will notify nursing.   Prognosis: hours to some very limited number of days.   35 minutes spent.   Loistine Chance MD [phone]  Palliative Medicine Team.

## 2016-12-06 NOTE — Progress Notes (Signed)
PULMONARY / CRITICAL CARE MEDICINE   Name: Brandon Schroeder. MRN: [id number] DOB: [birth year]    ADMISSION DATE:  12/16/2016 CONSULTATION DATE: 11/26/2016  REFERRING MD:  Dr Vance Gather / [facility]  CHIEF COMPLAINT:  Concern for ILD flare.  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  He reports mold exposure in his home.  He has family hx of IPF.  He was found to have changes consistent with pulmonary fibrosis and acute pneumonitis.   SUBJECTIVE:  Significant deterioration overnight, unresponsive this AM, family requesting a call to [facility] for lung transplant  VITAL SIGNS: BP (!) 151/92   Pulse 77   Temp 97.7 F (36.5 C) (Oral)   Resp (!) 25   Ht 6\' 3"  (1.905 m)   Wt 90.7 kg (199 lb 15.3 oz)   SpO2 (!) 79%   BMI 24.99 kg/m   INTAKE / OUTPUT: I/O last 3 completed shifts: In: 59 [P.O.:170; IV Piggyback:250] Out: 1350 [Urine:1350]  PHYSICAL EXAMINATION: General:  Unresponsive, not following any commands HEENT: [place]/AT, PERRL, EOM spontaneous, MMM PSY: Unresponsive Neuro: Unresponsive, withdraws to pain CV: RRR, Nl S1/S2, -M/R/G. PULM: Labored with diffuse crackles GI: Soft, NT, ND and +BS Extremities: -edema and -tenderness Skin: Intact  LABS:  BMET  Recent Labs Lab 12/03/16 0655 12/04/16 0348 12/05/16 0324  NA 138 135 135  K 4.2 4.3 4.1  CL 96* 92* 93*  CO2 31 33* 32  BUN 37* 39* 47*  CREATININE 1.02 1.00 1.27*  GLUCOSE 304* 220* 440*   Electrolytes  Recent Labs Lab 12/03/16 0655 12/04/16 0348 12/05/16 0324  CALCIUM 8.8* 8.5* 8.4*  MG  --  1.8 2.1   CBC  Recent Labs Lab 12/01/16 0623 12/03/16 0655  WBC 15.0* 17.3*  HGB 12.1* 12.8*  HCT 35.1* 38.0*  PLT 160 155   Sepsis Markers No results for input(s): LATICACIDVEN, PROCALCITON, O2SATVEN in the last 168 hours.   Liver Enzymes No results for input(s): AST, ALT, ALKPHOS,  BILITOT, ALBUMIN in the last 168 hours.  Cardiac Enzymes No results for input(s): TROPONINI, PROBNP in the last 168 hours.  Glucose  Recent Labs Lab 12/05/16 0757 12/05/16 1145 12/05/16 1253 12/05/16 1618 12/05/16 2148 12/06/16 0738  GLUCAP 302* 265* 189* 250* 412* 238*    Imaging No results found.  ILD workup: CK: 15 Legionella U antigen >> negative Aldolase >> normal Anti-scleroderma antibody >> negative Sjogrens syndrome B >> negative Sjogrens syndrome A  >> negative GBM >> normal Mpo/pr-3 (anca) >> negative  ANCA titers >> negative ANA w/reflex >> negative Anti-DNA antibody >> negative ACE >> 56 Sed rate: 84 HIV: NR RVP: not detected  ECHO 10/12 >> normal LV size, LVEF 55-60%, normal wall motion, mild to moderate MVR  I reviewed CT myself, significant deterioration of the pulmonary fibrosis  DISCUSSION: 71 y/o male likely has underlying pulmonary fibrosis that was undiagnosed now with acute pneumonitis.  Main concern is lung toxicity from methotrexate therapy for psoriatic arthritis.  The methotrexate was d/c'ed about 2 weeks prior to admission.  ASSESSMENT / PLAN:  Acute hypoxic respiratory failure with acute pneumonitis with underlying pulmonary fibrosis. DDx: Undiagnosed baseline IPF with acute flare, methotrexate related acute hypersensitivity pneumonitis, HSP, BOOP, ALI, autoimmune process in the setting of psoriatic arthritis, mild edema and opportunistic infection.  Suspect this is an IPF flare given he is not responding to steroids.  Plan: Family requesting a call to [facility] for transplant option which I informed them is completely out of the question given the fact that he is actively dying right now and needs a morphine drip.  They also requested a morphine drip for comfort but do not want to start it til a call is made to [facility].  At this point, I feel that a call for emergent transplant is not a viable option and will defer to primary whether or not to  make the call but I will place the morphine drip order per family's request.  Chest pain - unclear etiology, appears musculoskeletal  Plan: Morphine drip for comfort  OSA  Plan: D/C CPAP at this point  GERD Plan: D/C PPI  GOC  See discussion above, PCCM will sign off again as patient was seen again today at the family's request.  The patient is critically ill with multiple organ systems failure and requires high complexity decision making for assessment and support, frequent evaluation and titration of therapies, application of advanced monitoring technologies and extensive interpretation of multiple databases.   Critical Care Time devoted to patient care services described in this note is  35  Minutes. This time reflects time of care of this signee Dr Jennet Maduro. This critical care time does not reflect procedure time, or teaching time or supervisory time of PA/NP/Med student/Med Resident etc but could involve care discussion time.  Rush Farmer, M.D. [facility] Pulmonary/Critical Care Medicine. Pager: [phone]. After hours pager: [phone].

## 2016-12-06 NOTE — Progress Notes (Signed)
Pt is comfort care only, currently on PRB, HR85, RR19, Spo2 95%.  Spoke with patient's son at bedside regarding nocturnal cpap.  Patient's son stated his dad was comfortable and refused cpap for tonight.  RN aware.

## 2016-12-07 MED ORDER — NYSTATIN 100000 UNIT/ML MT SUSP
5.0000 mL | Freq: Four times a day (QID) | OROMUCOSAL | Status: DC
  Administered 2016-12-07: 500000 [IU] via ORAL
  Filled 2016-12-07: qty 5

## 2016-12-07 MED ORDER — LORAZEPAM 2 MG/ML IJ SOLN
0.5000 mg/h | INTRAVENOUS | Status: DC
  Administered 2016-12-07: 1.5 mg/h via INTRAVENOUS
  Administered 2016-12-07: 0.5 mg/h via INTRAVENOUS
  Administered 2016-12-07: 1 mg/h via INTRAVENOUS
  Filled 2016-12-07 (×2): qty 25

## 2016-12-08 ENCOUNTER — Encounter: Payer: Self-pay | Admitting: Family Medicine

## 2016-12-08 ENCOUNTER — Encounter: Payer: Self-pay | Admitting: Internal Medicine

## 2016-12-08 NOTE — Discharge Summary (Signed)
Discharge Summary  [name]. CLE:[id number] DOB: [birth year]  PCP: Laurey Morale, MD  Admit date: Dec 24, 2016 Time of death: 21:35 on 2023-01-06, I was not present. Night coverage notified by RN.  Time spent: <16mins   Discharge Diagnoses:  Active Hospital Problems   Diagnosis Date Noted  . HCAP (healthcare-associated pneumonia) Dec 24, 2016  . Encounter for palliative care   . Goals of care, counseling/discussion   . ILD (interstitial lung disease) ([place])   . Hypokalemia 08/16/2016  . 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 Depression with anxiety 01/31/2010  . Hypertension 12/20/2006    Resolved Hospital Problems   Diagnosis Date Noted Date Resolved  No resolved problems to display.     Filed Weights   12/04/16 0344 12/06/16 0400 01-05-17 2224  Weight: 96.1 kg (211 lb 13.8 oz) 90.7 kg (199 lb 15.3 oz) 90.7 kg (199 lb 15.3 oz)    History of present illness:  PCP: Laurey Morale, MD   Patient coming from: Home  Chief Complaint: SOB, cough  HPI: Luismario Coston. is a 71 y.o. male with medical history significant for nterstitial lung disease, depression with anxiety, and hypertension, now presenting to the emergency department for evaluation of shortness of breath and cough. Patient reports upper respiratory symptoms and a cough for about a month now, and was being evaluated by his PCP for this. He was treated initially with a steroid taper, had no improvement, and is now completing a course of Ceftin, but continues to worsen. He reported intermittent fevers at home and a CT scan of the chest was obtained as an outpatient. There was concern for a pneumonia on the CT, and the patient was directed to the ED for further evaluation. In addition to the patient's dyspnea, he has also complained of some lightheadedness, but no syncope. No abdominal pain, nausea, or vomiting. Had some diarrhea recently, but now resolved. No recent travel or sick contacts. Had  cholecystectomy in July 2018.   ED Course: Upon arrival to the ED, patient is found to be afebrile, saturating 88% on room air, and with vitals otherwise stable. EKG features a sinus rhythm with LVH by voltage criteria and CT of the chest had demonstrated patchy peribronchial vascular ground-glass and consolidation which is new from the prior study, and on a background of fibrotic interstitial lung disease. Chemistry panels notable for a potassium of 3.2 and CBC is unremarkable. Blood cultures were collected, 40 mEq potassium administered, and the patient was started on empiric cefepime and vancomycin. O2 saturations normalized with administration of 2 L/m supplemental oxygen. Patient remained hemodynamically stable in the ED and has not been in acute distress, and he will be admitted to the medical/surgical unit for ongoing evaluation and management of HCAP.  Hospital Course:  Principal Problem:   HCAP (healthcare-associated pneumonia) Active Problems:   Hypertension   Depression with anxiety   Complex sleep apnea syndrome   Hypokalemia   ILD (interstitial lung disease) ([place])   Encounter for palliative care   Goals of care, counseling/discussion  Brief Narrative:  Patient is 71 year old male with known history of hypertension, depression, presented to emergency department with main concern of several weeks to months duration of progressively worsening dry cough and dyspnea. Patient reports dyspnea was initially present only with exertion but over the past several weeks he has noted significant dyspnea at rest. Patient also reports associated intermittent low-grade fevers and was recently treated with steroids for possible costochondritis followed by Ceftin for presumptive  pneumonia that was seen on CT scan. Patient reports compliance with medications but his symptoms have not improved. He was admitted to [facility] for further evaluation.  On arrival to emergency department patient was  hypoxic with oxygen saturation 88% on room air and worsening drop with ambulation. CT chest revealed peribronchial vascular opacity, consolidation, questionable background of ILD.ulmonary team was consulted, started steroids and empiric antibiotics.   Hospital course complicated by persistent exertional dyspnea, persistent hypoxia especially with exertion, unable to taper off oxygen via nasal canula.  Assessment & Plan:   Acute hypoxemic respiratory failure with bilateral diffuse pulmonary infiltrates - Etiology remains unclear, ? IPF especially given family history ( patient's father died of IPF), BOOP, Autoimmune versus other vasculitis - ? Methotrexate related acute hypersensitivity pneumonitis (pt on Methotrexate for psoriatic arthritis)  - Current workup   Legionella urinary antigen -neg  Aldolase -normal  Anti scleroderma antibody - neg  Sjogren A - neg  Sjogren B - neg  GBM - normal  MPO/PR-3 - neg  ANCA titers - neg  ANA - negative   anti-DNA antibody  ECHO ef 55-60% - no improvement with several days of high dose steroids / abx/lasix, repeat ct chest worsening , pccm input appreciated. -patient and family want to change goals of care towards palliative /comfort focused care, palliative care consulted -on 10/21 family requested case to be discussed with [place] pulmonologist regarding possible lung transplant. Case discussed with transplant pulmonologist Dr Baxter Flattery at [facility], per Dr Baxter Flattery patient is not a candidate for transplant due to age> 65 and significant ill. Dr Baxter Flattery recommended family to undergo genetic testing. I have informed patient's wife. -he is started on full comfort palliative care following daily.   Hyperglycemia and Leukocytosis - from steroids, required insulin coverage - he is started on full comfort   Sleep apnea:   HTN      Code Status: DNR  Consultants:   Pulmonology critical care  Transplant pulmonology Dr Baxter Flattery at [facility]  medical center   Palliative care  Procedures:   None  Antimicrobials:  Vancomycin, cefepime 10/10 --> 10/14  Doxycycline 10/14 --> changed to IV on 12/01/2016, last dose on 10/20    Allergies  Allergen Reactions  . Bee Venom Anaphylaxis  . Levofloxacin Itching  . Penicillins Hives    ++ for ceftriaxone in the past++ Has patient had a PCN reaction causing immediate rash, facial/tongue/throat swelling, SOB or lightheadedness with hypotension: unknown Has patient had a PCN reaction causing severe rash involving mucus membranes or skin necrosis: unknown Has patient had a PCN reaction that required hospitalization No Has patient had a PCN reaction occurring within the last 10 years: No If all of the above answers are "NO", then may proceed with Cephalosporin use.   . Trazodone And Nefazodone Swelling    Throat swells     The results of significant diagnostics from this hospitalization (including imaging, microbiology, ancillary and laboratory) are listed below for reference.    Significant Diagnostic Studies: Dg Chest 2 View  Result Date: 11/30/2016 CLINICAL DATA:  Shortness of Breath EXAM: CHEST  2 VIEW COMPARISON:  Chest radiograph November 20, 2016; chest CT November 25, 2016. Chest radiograph November 17, 2013 FINDINGS: Widespread interstitial fibrosis is present. Areas of patchy airspace disease felt to represent multifocal pneumonia evident. The interstitium overall appears more prominent than on prior studies, and there may be a degree of interstitial edema superimposed as well. Heart is upper normal in size with pulmonary vascularity within normal  limits. No adenopathy is appreciable by radiography. IMPRESSION: Findings felt to be indicative of a degree of multifocal pneumonia superimposed on widespread fibrosis. Interstitium appears overall more prominent than on most recent studies, suggesting that there may be a degree of underlying interstitial edema as well. Suspect  combination of congestive heart failure and pneumonia superimposed on interstitial fibrosis. Cardiac silhouette remains stable. Electronically Signed   By: Lowella Grip III M.D.   On: 11/30/2016 08:50   Dg Chest 2 View  Result Date: 11/20/2016 CLINICAL DATA:  Shortness of breath and chest pain EXAM: CHEST  2 VIEW COMPARISON:  November 17, 2013 FINDINGS: There is multifocal airspace consolidation throughout the lungs bilaterally. There is the suggestion of developing cavitary lesion in the left lower lobe. Heart is upper normal in size with pulmonary vascularity within normal limits. No adenopathy. No evident bone lesions. IMPRESSION: Multifocal airspace opacity, likely multifocal pneumonia. Suspect developing cavitary lesion left lower lobe. This finding may warrant correlation with chest CT, ideally with intravenous contrast, to further evaluate. No adenopathy evident.  Cardiac silhouette within normal limits. These results will be called to the ordering clinician or representative by the Radiologist Assistant, and communication documented in the PACS or zVision Dashboard. Electronically Signed   By: Lowella Grip III M.D.   On: 11/20/2016 10:01   Ct Chest W Contrast  Result Date: 11/18/2016 CLINICAL DATA:  Shortness of breath and chest pain, chills and fever. EXAM: CT CHEST WITH CONTRAST TECHNIQUE: Multidetector CT imaging of the chest was performed during intravenous contrast administration. CONTRAST:  7mL ISOVUE-300 IOPAMIDOL (ISOVUE-300) INJECTION 61% COMPARISON:  CT abdomen pelvis 08/16/2016. FINDINGS: Cardiovascular: Atherosclerotic calcification of the arterial vasculature, including moderate to severe involvement of the coronary arteries. Aortic valvular calcification. Heart is mildly enlarged. No pericardial effusion. Mediastinum/Nodes: Mediastinal and hilar lymph nodes are not enlarged by CT size criteria. No axillary adenopathy. Esophagus is grossly unremarkable. Lungs/Pleura: There is  patchy peribronchovascular consolidation and ground-glass superimposed on subpleural reticulation and traction bronchiolectasis. Consolidation and ground-glass are new from 08/16/2016. No pleural fluid. Airway is unremarkable. Upper Abdomen: Visualized portions of the liver, adrenal glands, right kidney, spleen, pancreas, stomach and bowel are unremarkable with exception of a small hiatal hernia. Cholecystectomy. No upper abdominal adenopathy. Musculoskeletal: Degenerative changes in the spine. Old right rib fractures. T12 compression fracture, unchanged. IMPRESSION: 1. Patchy peribronchovascular ground-glass and consolidation, new from 08/16/2016 and likely due to bronchopneumonia. Organizing pneumonia is not excluded. 2. Underlying fibrotic interstitial lung disease. Consider high-resolution chest CT in further evaluation, when acute symptoms abate, as clinically indicated. 3. Aortic atherosclerosis (ICD10-170.0). Moderate to severe involvement of the coronary arteries. Electronically Signed   By: Lorin Picket M.D.   On: 12/08/2016 16:22   Ct Chest High Resolution  Result Date: 12/04/2016 CLINICAL DATA:  71 year old male with history of psoriatic arthritis on methotrexate therapy presenting with chest discomfort, cough and progressive dyspnea for the past 5-6 months. EXAM: CT CHEST WITHOUT CONTRAST TECHNIQUE: Multidetector CT imaging of the chest was performed following the standard protocol without intravenous contrast. High resolution imaging of the lungs, as well as inspiratory and expiratory imaging, was performed. COMPARISON:  Chest CT 12/06/2016. FINDINGS: Cardiovascular: Heart size is mildly enlarged. There is no significant pericardial fluid, thickening or pericardial calcification. There is aortic atherosclerosis, as well as atherosclerosis of the great vessels of the mediastinum and the coronary arteries, including calcified atherosclerotic plaque in the left circumflex (likely arising from the  proximal RCA taking a retroaortic course (a benign anatomical  variant), right and what appears to be an aberrant left anterior descending coronary artery which traverses in the right ventricular outflow tract (a benign anatomical variant) coronary arteries. Dilatation of the pulmonic trunk (3.9 cm in diameter). Mediastinum/Nodes: No pathologically enlarged mediastinal or hilar lymph nodes. Please note that accurate exclusion of hilar adenopathy is limited on noncontrast CT scans. Esophagus is unremarkable in appearance. No axillary lymphadenopathy. Lungs/Pleura: Compared to the prior examination, high-resolution images demonstrate worsening multifocal S attenuation, septal thickening, subpleural reticulation, thickening of the peribronchovascular interstitium and mild cylindrical bronchiectasis. There are few areas suspicious for early honeycombing. Inspiratory and expiratory imaging is unremarkable. Small bilateral pleural effusions lying dependently, with some fluid tracking in the superior aspect of the right major fissure. Upper Abdomen: Aortic atherosclerosis.  Status post cholecystectomy. Musculoskeletal: There are no aggressive appearing lytic or blastic lesions noted in the visualized portions of the skeleton. Chronic compression fracture of T12 with approximately 30% loss of anterior vertebral body height. IMPRESSION: 1. Worsening interstitial lung disease. The appearance is unusual. 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 Repeat high-resolution chest CT is recommended in 12 months assess for temporal changes in the appearance of the lung parenchyma. 2. Small bilateral pleural effusions. 3. Aortic atherosclerosis, in addition to 3 vessel coronary artery disease. Please note that although the presence of coronary artery calcium documents the  presence of coronary artery disease, the severity of this disease and any potential stenosis cannot be assessed on this non-gated CT examination. Assessment for potential risk factor modification, dietary therapy or pharmacologic therapy may be warranted, if clinically indicated. 4. Notably, the patient has a coronary artery variant. Specifically, there appears to be a single coronary artery arising from the right sinus of Valsalva giving rise to a normal right coronary artery, a left anterior descending which extends anterior to the right ventricular outflow tract before extending into the anterior interventricular groove (a benign anatomical variant), and the left circumflex coronary artery which takes a retroaortic (benign) course for extending into a normal position in the left atrioventricular groove. 5. Status post cholecystectomy. Aortic Atherosclerosis (ICD10-I70.0). Electronically Signed   By: Vinnie Langton M.D.   On: 12/04/2016 15:51    Microbiology: Recent Results (from the past 240 hour(s))  MRSA PCR Screening     Status: None   Collection Time: 12/03/16 12:17 PM  Result Value Ref Range Status   MRSA by PCR NEGATIVE NEGATIVE Final    Comment:        The GeneXpert MRSA Assay (FDA approved for NASAL specimens only), is one component of a comprehensive MRSA colonization surveillance program. It is not intended to diagnose MRSA infection nor to guide or monitor treatment for MRSA infections.      Labs: Basic Metabolic Panel:  Recent Labs Lab 12/02/16 0559 12/02/16 1245 12/03/16 0655 12/04/16 0348 12/05/16 0324  NA  --   --  138 135 135  K  --   --  4.2 4.3 4.1  CL  --   --  96* 92* 93*  CO2  --   --  31 33* 32  GLUCOSE  --  489* 304* 220* 440*  BUN  --   --  37* 39* 47*  CREATININE 1.02  --  1.02 1.00 1.27*  CALCIUM  --   --  8.8* 8.5* 8.4*  MG  --   --   --  1.8 2.1   Liver Function Tests: No results for input(s): AST, ALT, ALKPHOS, BILITOT, PROT, ALBUMIN in the  last 168 hours. No results for input(s): LIPASE, AMYLASE in the last 168 hours. No results for input(s): AMMONIA in the last 168 hours. CBC:  Recent Labs Lab 12/03/16 0655  WBC 17.3*  HGB 12.8*  HCT 38.0*  MCV 92.9  PLT 155   Cardiac Enzymes: No results for input(s): CKTOTAL, CKMB, CKMBINDEX, TROPONINI in the last 168 hours. BNP: BNP (last 3 results) No results for input(s): BNP in the last 8760 hours.  ProBNP (last 3 results) No results for input(s): PROBNP in the last 8760 hours.  CBG:  Recent Labs Lab 12/05/16 1145 12/05/16 1253 12/05/16 1618 12/05/16 2148 12/06/16 0738  GLUCAP 265* 189* 250* 412* 238*       SignedFlorencia Reasons MD, PhD  [facility] 12/08/2016, 7:43 AM

## 2016-12-09 ENCOUNTER — Inpatient Hospital Stay: Payer: PPO | Admitting: Internal Medicine

## 2016-12-09 ENCOUNTER — Telehealth: Payer: Self-pay | Admitting: Genetic Counselor

## 2016-12-09 NOTE — Telephone Encounter (Signed)
LM on VM that Dr. Chase Caller had indicated she may be interested in having her husband tested for IPF.  Let her know that we had a blood sample on him and wanted to see how we should proceed.  Asked her to please CB.

## 2016-12-11 ENCOUNTER — Telehealth: Payer: Self-pay | Admitting: Pulmonary Disease

## 2016-12-11 ENCOUNTER — Other Ambulatory Visit: Payer: Self-pay | Admitting: Internal Medicine

## 2016-12-11 DIAGNOSIS — J181 Lobar pneumonia, unspecified organism: Secondary | ICD-10-CM | POA: Diagnosis not present

## 2016-12-11 NOTE — Telephone Encounter (Signed)
PCCM had been following patient for concerns of IPF flare.  Mrs. 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  Unfortunately, Mr. Goodenow passed away during the night when I was off service.  I called Mrs. Recendez this am to extend condolences and inquired about if she had heard from our genetic counselor and she had not.  She again expressed concern that she wanted to proceed with obtaining blood and samples to explain the pathology that caused Clem's demise.  I informed her that I would try to get samples and she gave verbal consent for collection.   I called Pathology and spoke to [facility] regarding obtaining samples.  She discovered (via the Hydrographic surveyor) that the patient had already left the facility under the care of [facility] for tissue donation.  I confirmed the blood sampling needed from our genetic counselor and called [facility] to see if we would be able to obtain samples.  I spoke with [facility] and supervisor Brantley Stage who were able to obtain the needed blood and lung tissue sampling for pathology review.  I notified the genetic counselor, Dr. Chase Caller of collection.  Pathology samples left the the [place], [place] from the pathology lab at [place], NP-C [place] Pgr: [phone] or if no answer [phone] 12/11/2016, 12:36 PM

## 2016-12-17 NOTE — Progress Notes (Signed)
15 mL of Ativan 50 mg/2mL infusion wasted in sink. 200 mL of Morphine 250mg /244mL infusion wasted in sink. Witnessed by Duard Larsen, RN.

## 2016-12-17 NOTE — Progress Notes (Signed)
Patient is not seen today, chart reviewed, patient is on full comfort measures, palliative care following closely.

## 2016-12-17 NOTE — Progress Notes (Signed)
Pt. has been switching between using PRBR and HFNC, breathing  with RR in the 20's.  1:30am  RR in the 40's. PO Morphine & PO Ativan given to the patient.Patient's son requested dad be placed on CPAP . Resp. Therapist notified and pt. placed on the CPAP. Pt. now appears more comfortable . Will continue to monitor.

## 2016-12-17 NOTE — Progress Notes (Signed)
Comfort care orders are in place from 12/06/16. Patient on continuous Ativan & Morphine drip for comfort. Patient became bradycardic at 2133. Family notified of change in patient's status & enroute to hospital.. At 2135 asystole present on bedside monitor.Wife present at time of death. No breath sounds auscultated or pulse present. Verified with Kennis Carina, RN. On call [facility] Lovey Newcomer  notified and death certificate signed by Practitioner..  Patient personal belongings taken home by wife ,Nahshon Reich.

## 2016-12-17 NOTE — Consult Note (Signed)
   [facility] CM Inpatient Consult   12/19/16  Grayton Lobo. Nov 08, 1945 [id number]   [facility] Care Management follow up for potential engagement for services.  Reviewed palliative medicine notes. Mr. Crate is comfort care.  No identifiable [facility] Care Management needs.   Marthenia Rolling, MSN-Ed, RN,BSN [facility] Liaison [phone]

## 2016-12-17 NOTE — Progress Notes (Signed)
Entered room and son Joneen Caraway present and requested that this Probation officer place Bipap/Cpap on patient at this time for low saturations.  Placed patient on immediately became restless, agitated and attempting to pull mask off face.  Patent placed back on PRB and requested that RN. [place], adjusted medications and placed call to MD if needed.  Pt. Does not have a good wave form with pulse oximetry and explained this to [facility].

## 2016-12-17 NOTE — Progress Notes (Signed)
Nutrition Brief Note  Pt being followed by RD and was last seen on 10/18. Chart reviewed. Pt transitioned to comfort care yesterday (10/21).  No further nutrition interventions warranted at this time.  Please re-consult as needed.     Jarome Matin, MS, RD, LDN, [facility] Inpatient Clinical Dietitian Pager # [phone] After hours/weekend pager # [phone]

## 2016-12-17 NOTE — Progress Notes (Signed)
PMT progress note  Patient seen and examined. Overnight events noted, chart reviewed, discussed with [facility] MD Dr Erlinda Hong. Discussed in separate family meeting with wife and patient's sisters were visiting from out of town as well.   Patient is awake, resting in bed, he is more confused today, he is more restless and agitated today, currently on BiPAP   Urine is becoming more dark and concentrated. Additionally also has hematuria  BP (!) 151/92   Pulse 88   Temp 98.1 F (36.7 C) (Oral)   Resp (!) 27   Ht 6\' 3"  (1.905 m)   Wt 90.7 kg (199 lb 15.3 oz)   SpO2 (!) 80%   BMI 24.99 kg/m  No new labs or imaging.   Awake, not alert.  Diffuse crackles No edema S1 S2 regular.  Abdomen soft Eyes open, does not verbalize much Appears more restless and agitated.  A/P: 71 yo male with hx of psoriatic arthritis on MTX presented with chest discomfort, cough and progressive dyspnea over 5 to 6 months.  He reports mold exposure in his home.  He has family hx of IPF.  He was found to have changes consistent with pulmonary fibrosis and acute pneumonitis.   GOALS: Plan of care is now, to focus exclusively on comfort measures. Thankful for IV access that has been established this morning, discussed with patient's wife about low-dose Ativan drip. Discussed with bedside RN about appropriate comfort medications to be used on an as-needed basis.  Comfort cart for family, end of life signs and symptoms discussed extensively with family.   Prognosis: hours to some very limited number of days. Anticipate hospital death  35 minutes spent.   Loistine Chance MD [phone]  Palliative Medicine Team.

## 2016-12-17 DEATH — deceased

## 2016-12-18 ENCOUNTER — Telehealth: Payer: Self-pay | Admitting: Internal Medicine

## 2016-12-18 NOTE — Telephone Encounter (Signed)
Brandon Schroeder  Please call wife of Brandon Schroeder. Marland Kitchen He died. His post mortem lung pathology slide has come back as IPF flare up. Please give the diagnosis. Also,  I want to see his widow to discuss these results. You can make a 15  minute slot to discuss results.  Ask Brandon Schroeder/Brandon Schroeder if the visit should be under patient name or wife name. I can see as no charge. This is very important for the family.   First avail is fine  Dr. Brand Males, M.D., [facility].C.P Pulmonary and Critical Care Medicine Staff Physician [place] [facility] Pager: [phone], If no answer or between  15:00h - 7:00h: call [phone]  12/18/2016 2:12 PM

## 2016-12-21 ENCOUNTER — Encounter (HOSPITAL_COMMUNITY): Payer: Self-pay

## 2016-12-22 NOTE — Telephone Encounter (Signed)
Called to speak to Connell Bognar, pt's wife. Romie Minus did not answer so left message for her to return my call back.

## 2016-12-24 NOTE — Telephone Encounter (Signed)
Spoke with pt and advised her of MR message. Raquel Sarna please find a slot for pt's wife.

## 2016-12-24 NOTE — Telephone Encounter (Signed)
Called and spoke to [employer]. Reserved her time slot at 11:45 Monday, 12/28/16 to come speak to MR about the death of her husband.  Nothing further needed.

## 2016-12-24 NOTE — Telephone Encounter (Signed)
Wife Romie Minus [phone]) returned phone call

## 2016-12-28 ENCOUNTER — Telehealth: Payer: Self-pay | Admitting: Internal Medicine

## 2016-12-28 NOTE — Telephone Encounter (Signed)
[name] came to office today to receive path report - diagnosis of IPF flare given.   Explained and action items 1. IPF flare can be presenting manifestation of IPF  - rarely 2. She has 2 male sons and 17 male grandson - no formal screening program but said would reach out to PF 3. Genetics - will d/w Roma Kayser to see if the post mortem blood sample can be run 4. PFF support group - wife wishes to be active in the community with PFF - will connec her with Mr Marlane Mingle 5. Radiology - will reach out to Dr Rosario Jacks to see if in retrospect there were clues to ILD  16 min meeting   Dr. Brand Males, M.D., [facility].C.P Pulmonary and Critical Care Medicine Staff Physician [facility] Pager: [phone], If no answer or between  15:00h - 7:00h: call [phone]  12/28/2016 2:04 PM

## 2017-02-12 ENCOUNTER — Telehealth: Payer: Self-pay | Admitting: Genetic Counselor

## 2017-02-12 DIAGNOSIS — Z1379 Encounter for other screening for genetic and chromosomal anomalies: Secondary | ICD-10-CM | POA: Insufficient documentation

## 2017-02-12 NOTE — Telephone Encounter (Signed)
LM on VM that results were back and to please CB. 

## 2017-02-12 NOTE — Telephone Encounter (Signed)
Revealed negative genetic testing on the Pulmonary Fibrosis testing that was performed on her husband, Adelbert.  The patient would like to come in and discuss the testing results.  She is scheduled for 02/25/2016.

## 2018-06-23 IMAGING — CT CT CHEST HIGH RESOLUTION W/O CM
2 of 7 series · 13 of 36 positions shown, 16 images · non-contrast
Comparison: Chest CT 11/25/2016.

CLINICAL DATA: 71-year-old male with history of psoriatic arthritis
on methotrexate therapy presenting with chest discomfort, cough and
progressive dyspnea for the past 5-6 months.

EXAM:
CT CHEST WITHOUT CONTRAST
TECHNIQUE: Multidetector CT imaging of the chest was performed following the
standard protocol without intravenous contrast. High resolution
imaging of the lungs, as well as inspiratory and expiratory imaging,
was performed.

[Series 2: thorax · axial · 0.88mm/px · z∈[-340,-46]mm · 10 of 165 slices shown, 13 images]
[im 9/165  mediastinal]
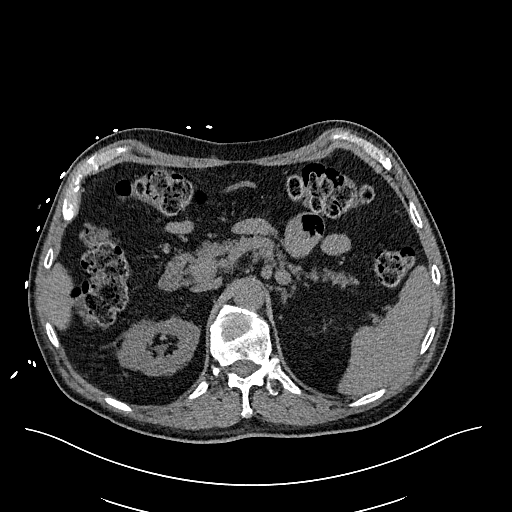
[im 9/165  lung]
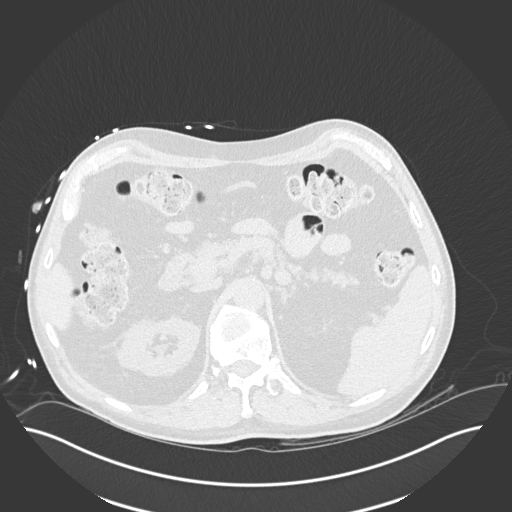
[im 25/165  lung]
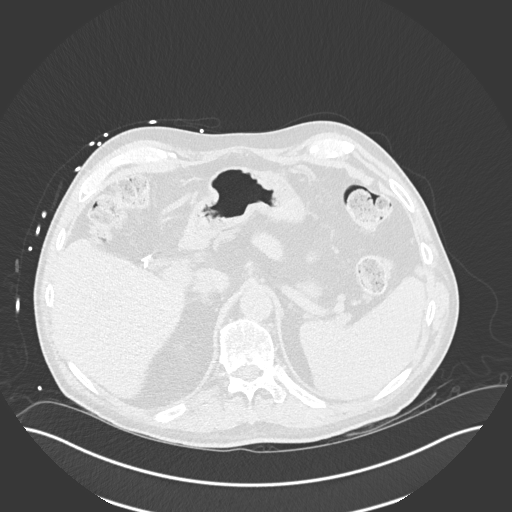
[im 42/165  lung]
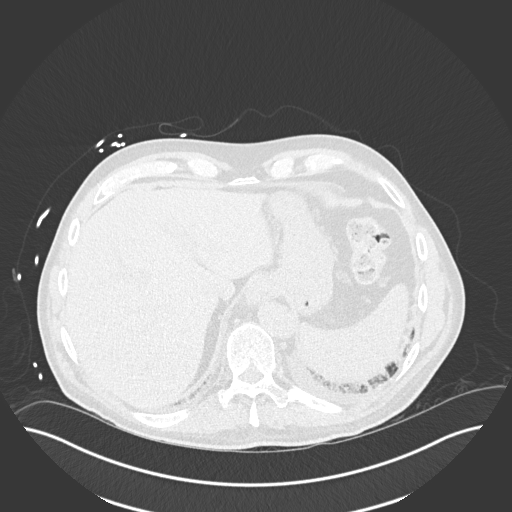
[im 58/165  lung]
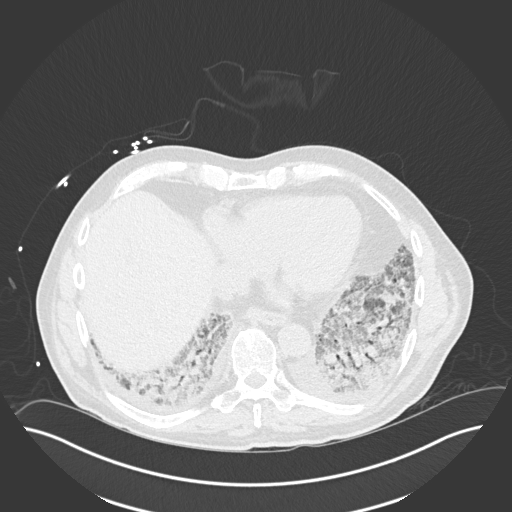
[im 74/165  mediastinal]
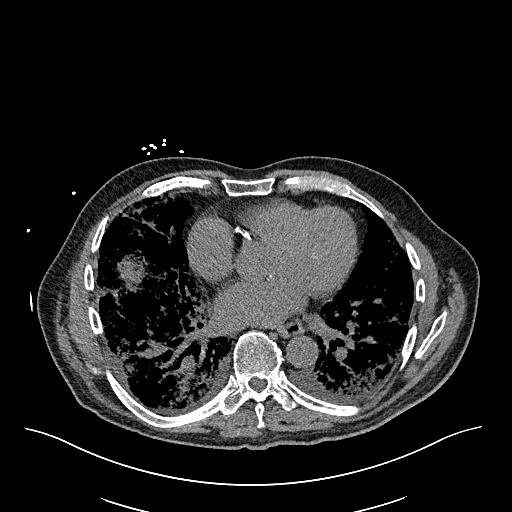
[im 74/165  lung]
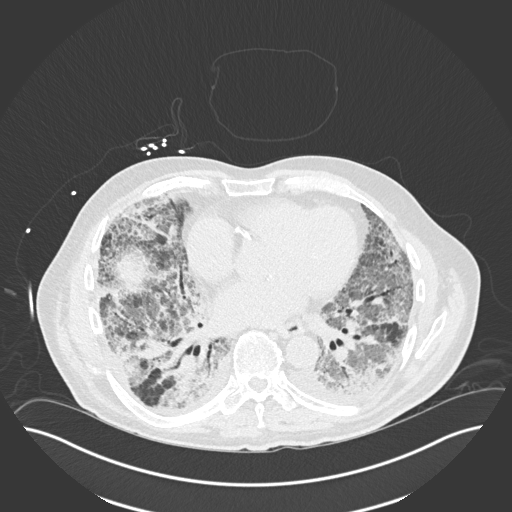
[im 91/165  lung]
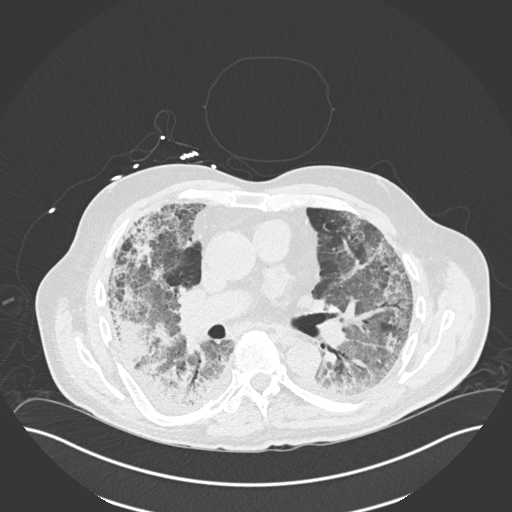
[im 107/165  lung]
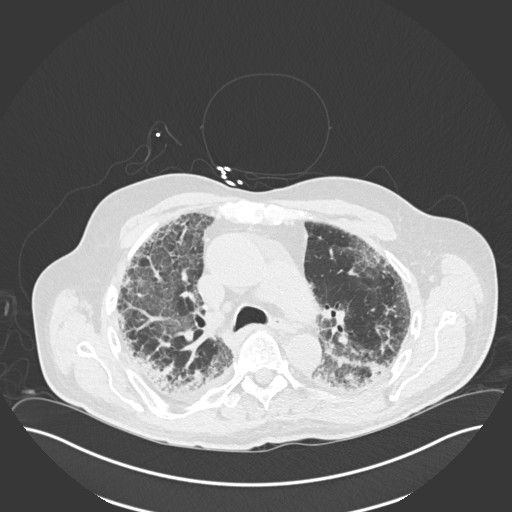
[im 123/165  lung]
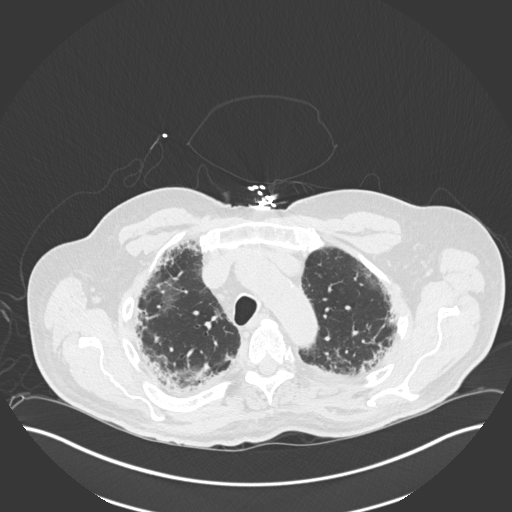
[im 140/165  mediastinal]
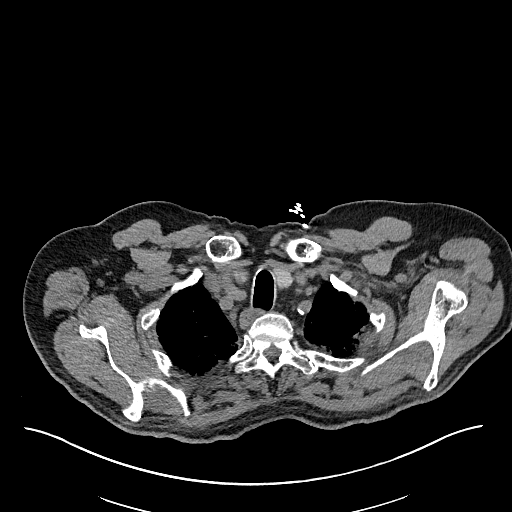
[im 140/165  lung]
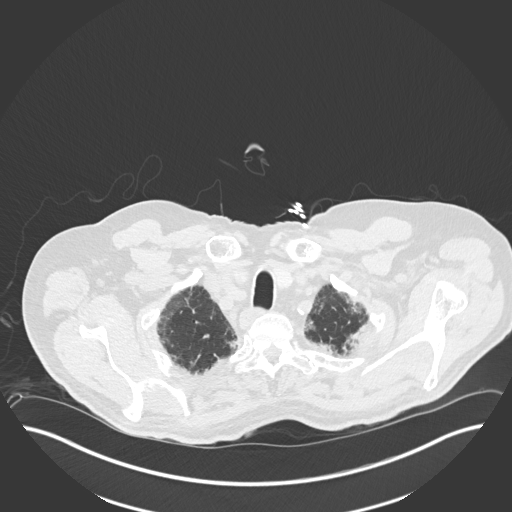
[im 156/165  lung]
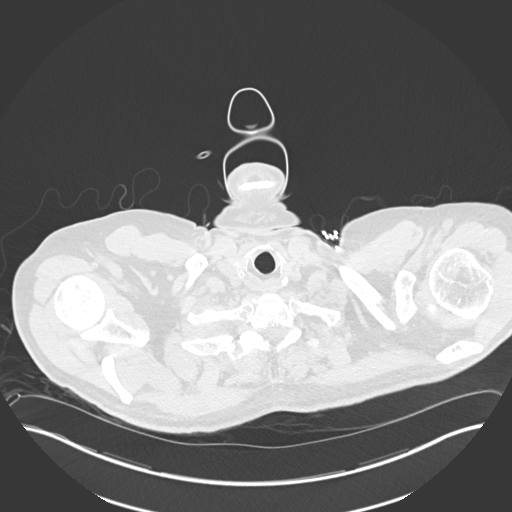

[Series 10: coronal · coronal · 0.65mm/px · 3 of 95 slices shown]
[im 19/95  lung]
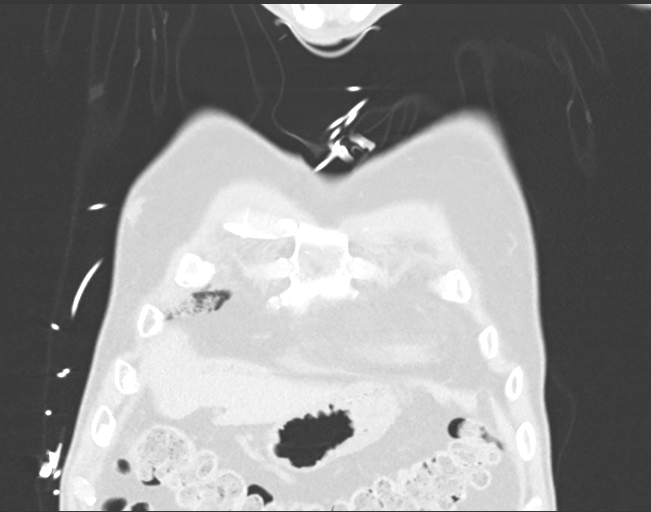
[im 38/95  lung]
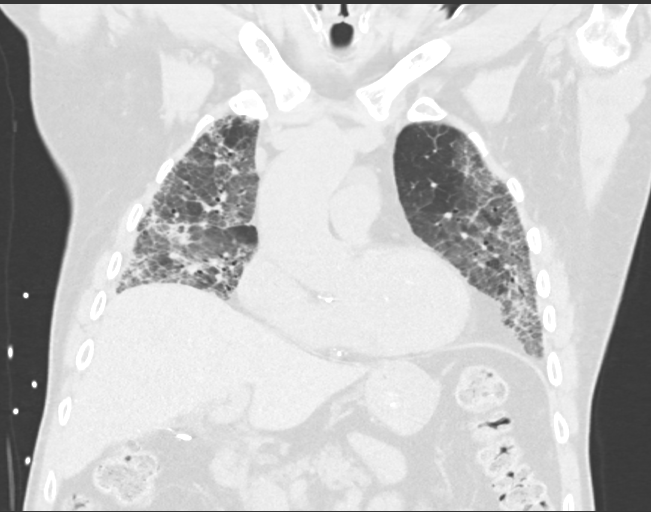
[im 57/95  lung]
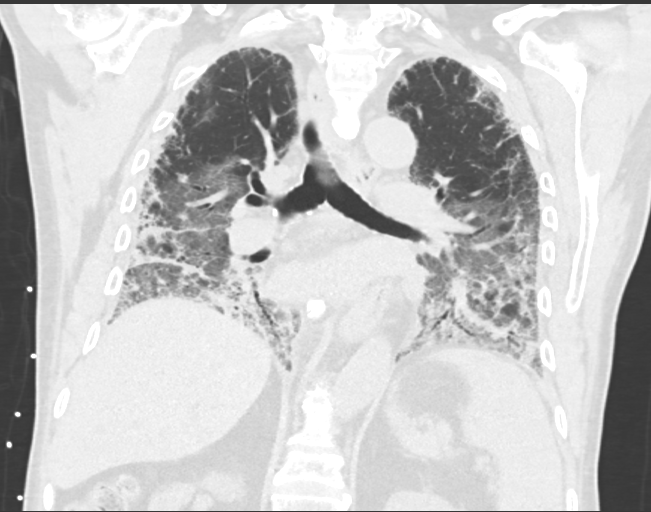

[13 of 36 positions shown; findings below may reference images not displayed]

FINDINGS: Cardiovascular: Heart size is mildly enlarged. There is no
significant pericardial fluid, thickening or pericardial
calcification. There is aortic atherosclerosis, as well as
atherosclerosis of the great vessels of the mediastinum and the
coronary arteries, including calcified atherosclerotic plaque in the
left circumflex (likely arising from the proximal RCA taking a
retroaortic course (a benign anatomical variant), right and what
appears to be an aberrant left anterior descending coronary artery
which traverses in the right ventricular outflow tract (a benign
anatomical variant) coronary arteries. Dilatation of the pulmonic
trunk (3.9 cm in diameter).

Mediastinum/Nodes: No pathologically enlarged mediastinal or hilar
lymph nodes. Please note that accurate exclusion of hilar adenopathy
is limited on noncontrast CT scans. Esophagus is unremarkable in
appearance. No axillary lymphadenopathy.

Lungs/Pleura: Compared to the prior examination, high-resolution
images demonstrate worsening multifocal S attenuation, septal
thickening, subpleural reticulation, thickening of the
peribronchovascular interstitium and mild cylindrical
bronchiectasis. There are few areas suspicious for early
honeycombing. Inspiratory and expiratory imaging is unremarkable.
Small bilateral pleural effusions lying dependently, with some fluid
tracking in the superior aspect of the right major fissure.

Upper Abdomen: Aortic atherosclerosis.  Status post cholecystectomy.

Musculoskeletal: There are no aggressive appearing lytic or blastic
lesions noted in the visualized portions of the skeleton. Chronic
compression fracture of T12 with approximately 30% loss of anterior
vertebral body height.
IMPRESSION: 1. Worsening interstitial lung disease. The appearance is unusual.
Given the progression compared to the prior study and a suggestion
of some areas of honeycombing, this is considered a probable usual
interstitial pneumonia (UIP) CT pattern, but this is not yet
definitive. Repeat high-resolution chest CT is recommended in 12
months assess for temporal changes in the appearance of the lung
parenchyma.
2. Small bilateral pleural effusions.
3. Aortic atherosclerosis, in addition to 3 vessel coronary artery
disease. Please note that although the presence of coronary artery
calcium documents the presence of coronary artery disease, the
severity of this disease and any potential stenosis cannot be
assessed on this non-gated CT examination. Assessment for potential
risk factor modification, dietary therapy or pharmacologic therapy
may be warranted, if clinically indicated.
4. Notably, the patient has a coronary artery variant. Specifically,
there appears to be a single coronary artery arising from the right
sinus of Valsalva giving rise to a normal right coronary artery, a
left anterior descending which extends anterior to the right
ventricular outflow tract before extending into the anterior
interventricular groove (a benign anatomical variant), and the left
circumflex coronary artery which takes a retroaortic (benign) course
for extending into a normal position in the left atrioventricular
groove.
5. Status post cholecystectomy.

Aortic Atherosclerosis ([id number]).
